# Patient Record
Sex: Male | Born: 1940 | Race: White | Hispanic: No | Marital: Married | Smoking: Former smoker
Health system: Southern US, Community
[De-identification: ages and names within clinical notes are randomized; demographics above are authoritative.]

## PROBLEM LIST (undated history)

## (undated) DIAGNOSIS — Z860100 Personal history of colon polyps, unspecified: Secondary | ICD-10-CM

## (undated) DIAGNOSIS — I4819 Other persistent atrial fibrillation: Secondary | ICD-10-CM

## (undated) DIAGNOSIS — Z8669 Personal history of other diseases of the nervous system and sense organs: Secondary | ICD-10-CM

## (undated) DIAGNOSIS — F419 Anxiety disorder, unspecified: Secondary | ICD-10-CM

## (undated) DIAGNOSIS — D696 Thrombocytopenia, unspecified: Secondary | ICD-10-CM

## (undated) DIAGNOSIS — I422 Other hypertrophic cardiomyopathy: Secondary | ICD-10-CM

## (undated) DIAGNOSIS — C679 Malignant neoplasm of bladder, unspecified: Secondary | ICD-10-CM

## (undated) DIAGNOSIS — Z9889 Other specified postprocedural states: Secondary | ICD-10-CM

## (undated) DIAGNOSIS — Z973 Presence of spectacles and contact lenses: Secondary | ICD-10-CM

## (undated) DIAGNOSIS — I872 Venous insufficiency (chronic) (peripheral): Secondary | ICD-10-CM

## (undated) DIAGNOSIS — I10 Essential (primary) hypertension: Secondary | ICD-10-CM

## (undated) DIAGNOSIS — R112 Nausea with vomiting, unspecified: Secondary | ICD-10-CM

## (undated) DIAGNOSIS — N529 Male erectile dysfunction, unspecified: Secondary | ICD-10-CM

## (undated) DIAGNOSIS — Z8711 Personal history of peptic ulcer disease: Secondary | ICD-10-CM

## (undated) DIAGNOSIS — Z8719 Personal history of other diseases of the digestive system: Secondary | ICD-10-CM

## (undated) HISTORY — PX: CARDIAC ELECTROPHYSIOLOGY MAPPING AND ABLATION: SHX1292

## (undated) HISTORY — PX: CATARACT EXTRACTION W/ INTRAOCULAR LENS IMPLANT: SHX1309

## (undated) HISTORY — PX: TRANSTHORACIC ECHOCARDIOGRAM: SHX275

## (undated) HISTORY — DX: Venous insufficiency (chronic) (peripheral): I87.2

## (undated) HISTORY — DX: Other hypertrophic cardiomyopathy: I42.2

## (undated) HISTORY — DX: Thrombocytopenia, unspecified: D69.6

## (undated) HISTORY — DX: Other persistent atrial fibrillation: I48.19

## (undated) HISTORY — PX: TONSILLECTOMY AND ADENOIDECTOMY: SUR1326

## (undated) HISTORY — PX: CARDIOVERSION: SHX1299

---

## 1998-11-10 ENCOUNTER — Inpatient Hospital Stay (HOSPITAL_COMMUNITY): Admission: EM | Admit: 1998-11-10 | Discharge: 1998-11-12 | Payer: Self-pay | Admitting: Emergency Medicine

## 1998-11-10 ENCOUNTER — Encounter: Payer: Self-pay | Admitting: Emergency Medicine

## 2000-12-23 ENCOUNTER — Encounter: Payer: Self-pay | Admitting: Cardiology

## 2001-02-01 ENCOUNTER — Ambulatory Visit (HOSPITAL_COMMUNITY): Admission: RE | Admit: 2001-02-01 | Discharge: 2001-02-01 | Payer: Self-pay | Admitting: Cardiology

## 2001-04-19 ENCOUNTER — Emergency Department (HOSPITAL_COMMUNITY): Admission: EM | Admit: 2001-04-19 | Discharge: 2001-04-19 | Payer: Self-pay | Admitting: Emergency Medicine

## 2003-11-01 ENCOUNTER — Encounter (INDEPENDENT_AMBULATORY_CARE_PROVIDER_SITE_OTHER): Payer: Self-pay | Admitting: *Deleted

## 2003-11-01 ENCOUNTER — Ambulatory Visit (HOSPITAL_COMMUNITY): Admission: RE | Admit: 2003-11-01 | Discharge: 2003-11-01 | Payer: Self-pay | Admitting: Gastroenterology

## 2004-01-03 ENCOUNTER — Ambulatory Visit (HOSPITAL_COMMUNITY): Admission: RE | Admit: 2004-01-03 | Discharge: 2004-01-03 | Payer: Self-pay | Admitting: Gastroenterology

## 2004-01-03 ENCOUNTER — Encounter (INDEPENDENT_AMBULATORY_CARE_PROVIDER_SITE_OTHER): Payer: Self-pay | Admitting: Specialist

## 2004-09-05 ENCOUNTER — Ambulatory Visit: Payer: Self-pay | Admitting: Cardiology

## 2004-09-23 ENCOUNTER — Ambulatory Visit: Payer: Self-pay | Admitting: Internal Medicine

## 2004-10-17 ENCOUNTER — Ambulatory Visit: Payer: Self-pay

## 2004-10-31 ENCOUNTER — Ambulatory Visit: Payer: Self-pay | Admitting: Cardiology

## 2004-10-31 ENCOUNTER — Ambulatory Visit: Payer: Self-pay | Admitting: Internal Medicine

## 2004-11-06 ENCOUNTER — Ambulatory Visit: Payer: Self-pay | Admitting: Cardiology

## 2004-11-28 ENCOUNTER — Ambulatory Visit: Payer: Self-pay | Admitting: Cardiology

## 2004-12-02 ENCOUNTER — Ambulatory Visit: Payer: Self-pay | Admitting: Cardiology

## 2004-12-26 ENCOUNTER — Ambulatory Visit: Payer: Self-pay | Admitting: Cardiology

## 2005-02-10 ENCOUNTER — Ambulatory Visit: Payer: Self-pay | Admitting: *Deleted

## 2005-02-21 ENCOUNTER — Ambulatory Visit: Payer: Self-pay | Admitting: Internal Medicine

## 2005-03-07 ENCOUNTER — Ambulatory Visit: Payer: Self-pay | Admitting: Cardiology

## 2005-04-10 ENCOUNTER — Ambulatory Visit: Payer: Self-pay | Admitting: Internal Medicine

## 2005-04-15 ENCOUNTER — Ambulatory Visit: Payer: Self-pay | Admitting: Cardiology

## 2005-05-08 ENCOUNTER — Ambulatory Visit: Payer: Self-pay | Admitting: Cardiology

## 2005-05-29 ENCOUNTER — Ambulatory Visit: Payer: Self-pay | Admitting: Cardiology

## 2005-05-29 ENCOUNTER — Ambulatory Visit: Payer: Self-pay | Admitting: Internal Medicine

## 2005-06-12 ENCOUNTER — Ambulatory Visit: Payer: Self-pay | Admitting: Internal Medicine

## 2005-07-03 ENCOUNTER — Ambulatory Visit: Payer: Self-pay | Admitting: Internal Medicine

## 2005-07-17 ENCOUNTER — Ambulatory Visit: Payer: Self-pay | Admitting: Cardiology

## 2005-07-29 ENCOUNTER — Ambulatory Visit: Payer: Self-pay | Admitting: Internal Medicine

## 2005-07-29 ENCOUNTER — Ambulatory Visit: Payer: Self-pay | Admitting: Cardiology

## 2005-09-02 ENCOUNTER — Ambulatory Visit: Payer: Self-pay | Admitting: Cardiology

## 2005-09-19 ENCOUNTER — Ambulatory Visit: Payer: Self-pay | Admitting: Cardiology

## 2005-10-22 ENCOUNTER — Ambulatory Visit: Payer: Self-pay | Admitting: *Deleted

## 2005-11-20 ENCOUNTER — Ambulatory Visit: Payer: Self-pay | Admitting: Cardiology

## 2005-12-01 ENCOUNTER — Ambulatory Visit: Payer: Self-pay | Admitting: Internal Medicine

## 2005-12-11 ENCOUNTER — Ambulatory Visit: Payer: Self-pay | Admitting: Cardiology

## 2005-12-19 ENCOUNTER — Ambulatory Visit: Payer: Self-pay | Admitting: Cardiology

## 2005-12-19 ENCOUNTER — Ambulatory Visit: Payer: Self-pay | Admitting: Pulmonary Disease

## 2005-12-25 ENCOUNTER — Ambulatory Visit: Payer: Self-pay | Admitting: Cardiology

## 2006-01-08 ENCOUNTER — Ambulatory Visit: Payer: Self-pay | Admitting: Cardiology

## 2006-01-29 ENCOUNTER — Ambulatory Visit: Payer: Self-pay | Admitting: Cardiology

## 2006-02-26 ENCOUNTER — Ambulatory Visit: Payer: Self-pay | Admitting: Cardiology

## 2006-03-26 ENCOUNTER — Ambulatory Visit: Payer: Self-pay | Admitting: Cardiology

## 2006-04-23 ENCOUNTER — Ambulatory Visit: Payer: Self-pay | Admitting: Internal Medicine

## 2006-05-07 ENCOUNTER — Ambulatory Visit: Payer: Self-pay | Admitting: Cardiology

## 2006-05-18 ENCOUNTER — Ambulatory Visit: Payer: Self-pay | Admitting: Cardiology

## 2006-06-04 ENCOUNTER — Ambulatory Visit: Payer: Self-pay | Admitting: Cardiology

## 2006-07-02 ENCOUNTER — Ambulatory Visit: Payer: Self-pay | Admitting: Cardiology

## 2006-07-30 ENCOUNTER — Ambulatory Visit: Payer: Self-pay | Admitting: Cardiology

## 2006-08-13 ENCOUNTER — Ambulatory Visit: Payer: Self-pay | Admitting: Internal Medicine

## 2006-08-27 ENCOUNTER — Ambulatory Visit: Payer: Self-pay | Admitting: *Deleted

## 2006-09-21 ENCOUNTER — Ambulatory Visit: Payer: Self-pay | Admitting: Cardiology

## 2006-10-05 ENCOUNTER — Ambulatory Visit: Payer: Self-pay | Admitting: Cardiology

## 2006-10-22 ENCOUNTER — Ambulatory Visit: Payer: Self-pay | Admitting: Cardiology

## 2006-11-02 ENCOUNTER — Encounter: Payer: Self-pay | Admitting: Cardiology

## 2006-11-02 ENCOUNTER — Ambulatory Visit: Payer: Self-pay

## 2006-11-19 ENCOUNTER — Ambulatory Visit: Payer: Self-pay | Admitting: Cardiology

## 2006-11-19 ENCOUNTER — Ambulatory Visit: Payer: Self-pay | Admitting: Internal Medicine

## 2006-11-19 LAB — CONVERTED CEMR LAB
Free T4: 1 ng/dL (ref 0.6–1.6)
T3, Free: 3 pg/mL (ref 2.3–4.2)
TSH: 5.13 microintl units/mL (ref 0.35–5.50)

## 2006-12-24 ENCOUNTER — Ambulatory Visit: Payer: Self-pay | Admitting: *Deleted

## 2007-01-21 ENCOUNTER — Ambulatory Visit: Payer: Self-pay | Admitting: Cardiology

## 2007-02-18 ENCOUNTER — Ambulatory Visit: Payer: Self-pay | Admitting: Cardiology

## 2007-03-18 ENCOUNTER — Ambulatory Visit: Payer: Self-pay | Admitting: Cardiology

## 2007-04-22 ENCOUNTER — Ambulatory Visit: Payer: Self-pay | Admitting: Cardiology

## 2007-05-20 ENCOUNTER — Ambulatory Visit: Payer: Self-pay | Admitting: Cardiology

## 2007-06-10 ENCOUNTER — Ambulatory Visit: Payer: Self-pay | Admitting: Cardiovascular Disease

## 2007-07-08 ENCOUNTER — Ambulatory Visit: Payer: Self-pay | Admitting: Cardiology

## 2007-08-05 ENCOUNTER — Ambulatory Visit: Payer: Self-pay | Admitting: Cardiology

## 2007-08-17 ENCOUNTER — Ambulatory Visit: Payer: Self-pay | Admitting: Cardiovascular Disease

## 2007-08-17 ENCOUNTER — Inpatient Hospital Stay (HOSPITAL_COMMUNITY): Admission: AD | Admit: 2007-08-17 | Discharge: 2007-08-18 | Payer: Self-pay | Admitting: Cardiovascular Disease

## 2007-08-31 ENCOUNTER — Ambulatory Visit: Payer: Self-pay | Admitting: Cardiology

## 2007-09-03 ENCOUNTER — Ambulatory Visit: Payer: Self-pay | Admitting: Cardiology

## 2007-09-21 ENCOUNTER — Ambulatory Visit: Payer: Self-pay | Admitting: Internal Medicine

## 2007-09-21 ENCOUNTER — Ambulatory Visit: Payer: Self-pay | Admitting: Cardiology

## 2007-09-21 LAB — CONVERTED CEMR LAB: Total CK: 76 units/L (ref 7–195)

## 2007-10-02 ENCOUNTER — Ambulatory Visit: Payer: Self-pay | Admitting: Cardiology

## 2007-10-19 ENCOUNTER — Ambulatory Visit: Payer: Self-pay | Admitting: Cardiology

## 2007-11-17 ENCOUNTER — Ambulatory Visit: Payer: Self-pay | Admitting: Cardiovascular Disease

## 2007-11-22 ENCOUNTER — Ambulatory Visit: Payer: Self-pay | Admitting: Internal Medicine

## 2007-11-23 ENCOUNTER — Ambulatory Visit: Payer: Self-pay | Admitting: Cardiovascular Disease

## 2007-11-23 ENCOUNTER — Inpatient Hospital Stay (HOSPITAL_COMMUNITY): Admission: AD | Admit: 2007-11-23 | Discharge: 2007-11-27 | Payer: Self-pay | Admitting: Cardiovascular Disease

## 2007-11-26 ENCOUNTER — Encounter: Payer: Self-pay | Admitting: Cardiovascular Disease

## 2007-11-27 HISTORY — PX: TEE WITH CARDIOVERSION: SHX5442

## 2007-12-03 ENCOUNTER — Ambulatory Visit: Payer: Self-pay

## 2007-12-03 LAB — CONVERTED CEMR LAB
BUN: 24 mg/dL — ABNORMAL HIGH (ref 6–23)
CO2: 31 meq/L (ref 19–32)
Calcium: 8.7 mg/dL (ref 8.4–10.5)
GFR calc Af Amer: 78 mL/min
GFR calc non Af Amer: 64 mL/min
Potassium: 3.8 meq/L (ref 3.5–5.1)

## 2007-12-15 ENCOUNTER — Ambulatory Visit: Payer: Self-pay | Admitting: Internal Medicine

## 2008-01-03 ENCOUNTER — Ambulatory Visit: Payer: Self-pay | Admitting: Cardiology

## 2008-01-13 ENCOUNTER — Ambulatory Visit: Payer: Self-pay | Admitting: Cardiology

## 2008-01-29 ENCOUNTER — Ambulatory Visit: Payer: Self-pay | Admitting: Cardiology

## 2008-01-29 ENCOUNTER — Inpatient Hospital Stay (HOSPITAL_COMMUNITY): Admission: EM | Admit: 2008-01-29 | Discharge: 2008-01-31 | Payer: Self-pay | Admitting: Emergency Medicine

## 2008-01-31 ENCOUNTER — Encounter: Payer: Self-pay | Admitting: Internal Medicine

## 2008-02-03 ENCOUNTER — Ambulatory Visit: Payer: Self-pay | Admitting: Internal Medicine

## 2008-02-23 ENCOUNTER — Ambulatory Visit: Payer: Self-pay | Admitting: Cardiology

## 2008-03-06 ENCOUNTER — Ambulatory Visit: Payer: Self-pay | Admitting: Cardiology

## 2008-04-03 ENCOUNTER — Ambulatory Visit: Payer: Self-pay | Admitting: Cardiology

## 2008-04-03 LAB — CONVERTED CEMR LAB
Alkaline Phosphatase: 55 units/L (ref 39–117)
Bilirubin, Direct: 0.2 mg/dL (ref 0.0–0.3)
Total Bilirubin: 0.8 mg/dL (ref 0.3–1.2)
Total Protein: 6 g/dL (ref 6.0–8.3)

## 2008-05-01 ENCOUNTER — Ambulatory Visit: Payer: Self-pay | Admitting: Internal Medicine

## 2008-05-29 ENCOUNTER — Ambulatory Visit: Payer: Self-pay | Admitting: Cardiology

## 2008-06-28 ENCOUNTER — Encounter: Payer: Self-pay | Admitting: Cardiology

## 2008-06-28 ENCOUNTER — Ambulatory Visit: Payer: Self-pay | Admitting: Internal Medicine

## 2008-07-01 ENCOUNTER — Emergency Department (HOSPITAL_COMMUNITY): Admission: EM | Admit: 2008-07-01 | Discharge: 2008-07-01 | Payer: Self-pay | Admitting: Emergency Medicine

## 2008-07-05 ENCOUNTER — Ambulatory Visit: Payer: Self-pay | Admitting: Internal Medicine

## 2008-07-05 LAB — CONVERTED CEMR LAB
BUN: 15 mg/dL (ref 6–23)
CO2: 32 meq/L (ref 19–32)
Calcium: 8.8 mg/dL (ref 8.4–10.5)
Eosinophils Relative: 1.7 % (ref 0.0–5.0)
Glucose, Bld: 99 mg/dL (ref 70–99)
HCT: 44 % (ref 39.0–52.0)
Hemoglobin: 15 g/dL (ref 13.0–17.0)
INR: 2.4 — ABNORMAL HIGH (ref 0.8–1.0)
Lymphocytes Relative: 20.3 % (ref 12.0–46.0)
Magnesium: 2.4 mg/dL (ref 1.5–2.5)
Monocytes Absolute: 0.6 10*3/uL (ref 0.1–1.0)
Monocytes Relative: 8.2 % (ref 3.0–12.0)
Neutro Abs: 4.8 10*3/uL (ref 1.4–7.7)
Prothrombin Time: 25.7 s — ABNORMAL HIGH (ref 10.9–13.3)
RBC: 4.71 M/uL (ref 4.22–5.81)
RDW: 14 % (ref 11.5–14.6)
Sodium: 143 meq/L (ref 135–145)
WBC: 6.9 10*3/uL (ref 4.5–10.5)

## 2008-07-07 ENCOUNTER — Ambulatory Visit: Payer: Self-pay | Admitting: Internal Medicine

## 2008-07-07 ENCOUNTER — Ambulatory Visit (HOSPITAL_COMMUNITY): Admission: RE | Admit: 2008-07-07 | Discharge: 2008-07-07 | Payer: Self-pay | Admitting: Internal Medicine

## 2008-08-02 ENCOUNTER — Ambulatory Visit: Payer: Self-pay | Admitting: Internal Medicine

## 2008-08-21 ENCOUNTER — Ambulatory Visit: Payer: Self-pay | Admitting: Cardiology

## 2008-08-31 ENCOUNTER — Ambulatory Visit: Payer: Self-pay | Admitting: Cardiology

## 2008-09-28 ENCOUNTER — Ambulatory Visit: Payer: Self-pay | Admitting: Cardiology

## 2008-10-21 ENCOUNTER — Ambulatory Visit: Payer: Self-pay | Admitting: Internal Medicine

## 2008-10-21 ENCOUNTER — Emergency Department (HOSPITAL_COMMUNITY): Admission: EM | Admit: 2008-10-21 | Discharge: 2008-10-21 | Payer: Self-pay | Admitting: Emergency Medicine

## 2008-10-25 ENCOUNTER — Ambulatory Visit: Payer: Self-pay | Admitting: Internal Medicine

## 2008-11-02 ENCOUNTER — Ambulatory Visit: Payer: Self-pay | Admitting: Internal Medicine

## 2008-11-20 ENCOUNTER — Ambulatory Visit (HOSPITAL_COMMUNITY): Admission: RE | Admit: 2008-11-20 | Discharge: 2008-11-20 | Payer: Self-pay | Admitting: Internal Medicine

## 2008-11-20 ENCOUNTER — Ambulatory Visit: Payer: Self-pay | Admitting: Cardiology

## 2008-11-20 ENCOUNTER — Ambulatory Visit: Payer: Self-pay | Admitting: Internal Medicine

## 2008-11-29 ENCOUNTER — Ambulatory Visit (HOSPITAL_BASED_OUTPATIENT_CLINIC_OR_DEPARTMENT_OTHER): Admission: RE | Admit: 2008-11-29 | Discharge: 2008-11-29 | Payer: Self-pay | Admitting: Internal Medicine

## 2008-11-30 ENCOUNTER — Ambulatory Visit: Payer: Self-pay | Admitting: Internal Medicine

## 2008-11-30 LAB — CONVERTED CEMR LAB
BUN: 21 mg/dL (ref 6–23)
Basophils Relative: 0.4 % (ref 0.0–3.0)
CO2: 33 meq/L — ABNORMAL HIGH (ref 19–32)
Chloride: 105 meq/L (ref 96–112)
Creatinine, Ser: 1.1 mg/dL (ref 0.4–1.5)
Eosinophils Relative: 3.2 % (ref 0.0–5.0)
Hemoglobin: 14.2 g/dL (ref 13.0–17.0)
Lymphocytes Relative: 21.7 % (ref 12.0–46.0)
MCV: 91.9 fL (ref 78.0–100.0)
Neutro Abs: 4.8 10*3/uL (ref 1.4–7.7)
Neutrophils Relative %: 65.9 % (ref 43.0–77.0)
Prothrombin Time: 26.4 s — ABNORMAL HIGH (ref 10.9–13.3)
RBC: 4.48 M/uL (ref 4.22–5.81)
WBC: 7.2 10*3/uL (ref 4.5–10.5)

## 2008-12-06 ENCOUNTER — Ambulatory Visit: Payer: Self-pay | Admitting: Cardiology

## 2008-12-06 ENCOUNTER — Ambulatory Visit (HOSPITAL_COMMUNITY): Admission: RE | Admit: 2008-12-06 | Discharge: 2008-12-06 | Payer: Self-pay | Admitting: Cardiology

## 2008-12-06 ENCOUNTER — Encounter: Payer: Self-pay | Admitting: Cardiology

## 2008-12-07 ENCOUNTER — Inpatient Hospital Stay (HOSPITAL_COMMUNITY): Admission: AD | Admit: 2008-12-07 | Discharge: 2008-12-08 | Payer: Self-pay | Admitting: Internal Medicine

## 2008-12-07 ENCOUNTER — Ambulatory Visit: Payer: Self-pay | Admitting: Internal Medicine

## 2008-12-13 ENCOUNTER — Ambulatory Visit: Payer: Self-pay

## 2008-12-13 ENCOUNTER — Ambulatory Visit: Payer: Self-pay | Admitting: Internal Medicine

## 2008-12-15 ENCOUNTER — Ambulatory Visit (HOSPITAL_COMMUNITY): Admission: RE | Admit: 2008-12-15 | Discharge: 2008-12-15 | Payer: Self-pay | Admitting: Internal Medicine

## 2008-12-15 ENCOUNTER — Ambulatory Visit: Payer: Self-pay | Admitting: Internal Medicine

## 2008-12-19 ENCOUNTER — Ambulatory Visit: Payer: Self-pay | Admitting: Cardiology

## 2008-12-19 ENCOUNTER — Ambulatory Visit: Payer: Self-pay | Admitting: Cardiovascular Disease

## 2008-12-23 ENCOUNTER — Ambulatory Visit: Payer: Self-pay | Admitting: Internal Medicine

## 2008-12-28 ENCOUNTER — Ambulatory Visit: Payer: Self-pay | Admitting: Cardiology

## 2008-12-28 ENCOUNTER — Ambulatory Visit: Payer: Self-pay | Admitting: Cardiovascular Disease

## 2008-12-29 ENCOUNTER — Ambulatory Visit: Payer: Self-pay | Admitting: Internal Medicine

## 2009-01-04 ENCOUNTER — Ambulatory Visit: Payer: Self-pay | Admitting: Cardiovascular Disease

## 2009-01-18 ENCOUNTER — Ambulatory Visit: Payer: Self-pay | Admitting: Cardiology

## 2009-01-23 DIAGNOSIS — I10 Essential (primary) hypertension: Secondary | ICD-10-CM | POA: Insufficient documentation

## 2009-01-23 DIAGNOSIS — I4891 Unspecified atrial fibrillation: Secondary | ICD-10-CM | POA: Insufficient documentation

## 2009-01-23 DIAGNOSIS — I421 Obstructive hypertrophic cardiomyopathy: Secondary | ICD-10-CM | POA: Insufficient documentation

## 2009-01-23 DIAGNOSIS — I4892 Unspecified atrial flutter: Secondary | ICD-10-CM | POA: Insufficient documentation

## 2009-01-25 ENCOUNTER — Ambulatory Visit: Payer: Self-pay | Admitting: Internal Medicine

## 2009-01-25 ENCOUNTER — Encounter: Payer: Self-pay | Admitting: Internal Medicine

## 2009-01-25 ENCOUNTER — Ambulatory Visit: Payer: Self-pay | Admitting: Cardiovascular Disease

## 2009-02-01 ENCOUNTER — Ambulatory Visit: Payer: Self-pay | Admitting: Internal Medicine

## 2009-02-08 ENCOUNTER — Ambulatory Visit: Payer: Self-pay | Admitting: Internal Medicine

## 2009-02-13 ENCOUNTER — Ambulatory Visit: Payer: Self-pay | Admitting: Internal Medicine

## 2009-02-16 ENCOUNTER — Ambulatory Visit (HOSPITAL_COMMUNITY): Admission: RE | Admit: 2009-02-16 | Discharge: 2009-02-16 | Payer: Self-pay | Admitting: Cardiology

## 2009-02-16 ENCOUNTER — Ambulatory Visit: Payer: Self-pay | Admitting: Cardiology

## 2009-02-16 LAB — CONVERTED CEMR LAB
Basophils Absolute: 0 10*3/uL (ref 0.0–0.1)
CO2: 31 meq/L (ref 19–32)
Chloride: 109 meq/L (ref 96–112)
Glucose, Bld: 93 mg/dL (ref 70–99)
Hemoglobin: 14.2 g/dL (ref 13.0–17.0)
INR: 2.8 — ABNORMAL HIGH (ref 0.8–1.0)
Lymphocytes Relative: 23.9 % (ref 12.0–46.0)
Monocytes Relative: 9.2 % (ref 3.0–12.0)
Neutro Abs: 4.1 10*3/uL (ref 1.4–7.7)
Potassium: 3.8 meq/L (ref 3.5–5.1)
RBC: 4.39 M/uL (ref 4.22–5.81)
RDW: 14.5 % (ref 11.5–14.6)
Sodium: 144 meq/L (ref 135–145)
WBC: 6.6 10*3/uL (ref 4.5–10.5)
aPTT: 32.1 s — ABNORMAL HIGH (ref 21.7–28.8)

## 2009-02-19 ENCOUNTER — Telehealth: Payer: Self-pay | Admitting: Internal Medicine

## 2009-02-26 ENCOUNTER — Ambulatory Visit: Payer: Self-pay | Admitting: Cardiology

## 2009-03-05 ENCOUNTER — Ambulatory Visit: Payer: Self-pay | Admitting: Internal Medicine

## 2009-03-12 ENCOUNTER — Ambulatory Visit: Payer: Self-pay | Admitting: Cardiology

## 2009-03-13 ENCOUNTER — Telehealth: Payer: Self-pay | Admitting: Cardiology

## 2009-03-22 ENCOUNTER — Telehealth: Payer: Self-pay | Admitting: Cardiology

## 2009-03-27 ENCOUNTER — Encounter: Payer: Self-pay | Admitting: *Deleted

## 2009-03-28 ENCOUNTER — Encounter: Payer: Self-pay | Admitting: Cardiology

## 2009-04-04 ENCOUNTER — Ambulatory Visit: Payer: Self-pay | Admitting: Cardiology

## 2009-04-04 ENCOUNTER — Telehealth: Payer: Self-pay | Admitting: Cardiology

## 2009-04-05 ENCOUNTER — Ambulatory Visit: Payer: Self-pay

## 2009-04-05 ENCOUNTER — Encounter: Payer: Self-pay | Admitting: Cardiology

## 2009-04-11 ENCOUNTER — Telehealth: Payer: Self-pay | Admitting: Cardiology

## 2009-04-16 ENCOUNTER — Ambulatory Visit: Payer: Self-pay | Admitting: Cardiology

## 2009-04-16 ENCOUNTER — Ambulatory Visit: Payer: Self-pay | Admitting: Internal Medicine

## 2009-04-19 ENCOUNTER — Ambulatory Visit: Payer: Self-pay | Admitting: Internal Medicine

## 2009-04-19 LAB — CONVERTED CEMR LAB
POC INR: 3.5
Prothrombin Time: 22.7 s

## 2009-04-26 ENCOUNTER — Ambulatory Visit: Payer: Self-pay | Admitting: Cardiology

## 2009-05-02 ENCOUNTER — Encounter: Payer: Self-pay | Admitting: *Deleted

## 2009-05-03 ENCOUNTER — Ambulatory Visit: Payer: Self-pay | Admitting: Internal Medicine

## 2009-05-03 LAB — CONVERTED CEMR LAB: Prothrombin Time: 19.9 s

## 2009-05-04 ENCOUNTER — Telehealth: Payer: Self-pay | Admitting: Internal Medicine

## 2009-05-07 ENCOUNTER — Ambulatory Visit (HOSPITAL_COMMUNITY): Admission: RE | Admit: 2009-05-07 | Discharge: 2009-05-07 | Payer: Self-pay | Admitting: Cardiovascular Disease

## 2009-05-07 ENCOUNTER — Ambulatory Visit: Payer: Self-pay | Admitting: Cardiovascular Disease

## 2009-05-08 ENCOUNTER — Observation Stay (HOSPITAL_COMMUNITY): Admission: RE | Admit: 2009-05-08 | Discharge: 2009-05-09 | Payer: Self-pay | Admitting: Internal Medicine

## 2009-05-08 ENCOUNTER — Ambulatory Visit: Payer: Self-pay | Admitting: Internal Medicine

## 2009-05-16 ENCOUNTER — Encounter: Payer: Self-pay | Admitting: Internal Medicine

## 2009-05-16 ENCOUNTER — Ambulatory Visit: Payer: Self-pay | Admitting: Cardiology

## 2009-05-16 LAB — CONVERTED CEMR LAB: POC INR: 2.8

## 2009-05-23 ENCOUNTER — Ambulatory Visit: Payer: Self-pay | Admitting: Cardiology

## 2009-05-23 ENCOUNTER — Telehealth: Payer: Self-pay | Admitting: Internal Medicine

## 2009-05-31 ENCOUNTER — Ambulatory Visit: Payer: Self-pay | Admitting: Cardiology

## 2009-06-15 ENCOUNTER — Ambulatory Visit: Payer: Self-pay | Admitting: Cardiology

## 2009-06-15 ENCOUNTER — Telehealth (INDEPENDENT_AMBULATORY_CARE_PROVIDER_SITE_OTHER): Payer: Self-pay | Admitting: *Deleted

## 2009-06-27 ENCOUNTER — Ambulatory Visit: Payer: Self-pay | Admitting: Internal Medicine

## 2009-06-27 LAB — CONVERTED CEMR LAB: POC INR: 3.1

## 2009-07-12 ENCOUNTER — Ambulatory Visit: Payer: Self-pay | Admitting: Cardiology

## 2009-07-12 LAB — CONVERTED CEMR LAB: POC INR: 2.6

## 2009-07-13 ENCOUNTER — Ambulatory Visit: Payer: Self-pay | Admitting: Internal Medicine

## 2009-07-13 ENCOUNTER — Encounter (INDEPENDENT_AMBULATORY_CARE_PROVIDER_SITE_OTHER): Payer: Self-pay | Admitting: *Deleted

## 2009-07-16 ENCOUNTER — Ambulatory Visit: Payer: Self-pay | Admitting: Cardiology

## 2009-07-16 ENCOUNTER — Telehealth: Payer: Self-pay | Admitting: Internal Medicine

## 2009-07-16 LAB — CONVERTED CEMR LAB: POC INR: 2.4

## 2009-07-17 ENCOUNTER — Ambulatory Visit (HOSPITAL_COMMUNITY): Admission: RE | Admit: 2009-07-17 | Discharge: 2009-07-17 | Payer: Self-pay | Admitting: Internal Medicine

## 2009-07-17 ENCOUNTER — Ambulatory Visit: Payer: Self-pay | Admitting: Internal Medicine

## 2009-07-18 ENCOUNTER — Telehealth: Payer: Self-pay | Admitting: Internal Medicine

## 2009-07-30 ENCOUNTER — Ambulatory Visit: Payer: Self-pay | Admitting: Internal Medicine

## 2009-07-30 LAB — CONVERTED CEMR LAB: POC INR: 2.5

## 2009-08-03 ENCOUNTER — Telehealth: Payer: Self-pay | Admitting: Cardiology

## 2009-08-08 ENCOUNTER — Ambulatory Visit: Payer: Self-pay | Admitting: Internal Medicine

## 2009-08-08 ENCOUNTER — Telehealth: Payer: Self-pay | Admitting: Cardiology

## 2009-08-09 LAB — CONVERTED CEMR LAB
Calcium: 8.7 mg/dL (ref 8.4–10.5)
Chloride: 104 meq/L (ref 96–112)
Creatinine, Ser: 1.2 mg/dL (ref 0.4–1.5)
GFR calc non Af Amer: 63.86 mL/min (ref >60)

## 2009-09-03 ENCOUNTER — Ambulatory Visit: Payer: Self-pay | Admitting: Internal Medicine

## 2009-09-03 LAB — CONVERTED CEMR LAB: POC INR: 2.3

## 2009-09-10 ENCOUNTER — Encounter (INDEPENDENT_AMBULATORY_CARE_PROVIDER_SITE_OTHER): Payer: Self-pay | Admitting: *Deleted

## 2009-09-18 ENCOUNTER — Ambulatory Visit: Payer: Self-pay | Admitting: Cardiology

## 2009-10-01 ENCOUNTER — Ambulatory Visit: Payer: Self-pay | Admitting: Cardiology

## 2009-10-01 LAB — CONVERTED CEMR LAB: POC INR: 2.6

## 2009-10-27 HISTORY — PX: OTHER SURGICAL HISTORY: SHX169

## 2009-10-29 ENCOUNTER — Ambulatory Visit: Payer: Self-pay | Admitting: Cardiology

## 2009-10-29 LAB — CONVERTED CEMR LAB: POC INR: 2.2

## 2009-10-30 LAB — CONVERTED CEMR LAB
Calcium: 8.9 mg/dL (ref 8.4–10.5)
Creatinine, Ser: 1.3 mg/dL (ref 0.4–1.5)
GFR calc non Af Amer: 58.19 mL/min (ref >60)
Sodium: 141 meq/L (ref 135–145)

## 2009-11-22 ENCOUNTER — Ambulatory Visit: Payer: Self-pay | Admitting: Cardiology

## 2009-11-26 ENCOUNTER — Ambulatory Visit: Payer: Self-pay | Admitting: Cardiology

## 2009-11-26 LAB — CONVERTED CEMR LAB: POC INR: 2.2

## 2009-12-12 ENCOUNTER — Ambulatory Visit: Payer: Self-pay | Admitting: Internal Medicine

## 2009-12-24 ENCOUNTER — Ambulatory Visit: Payer: Self-pay | Admitting: Cardiology

## 2009-12-24 LAB — CONVERTED CEMR LAB: POC INR: 2.1

## 2010-01-03 ENCOUNTER — Ambulatory Visit: Payer: Self-pay | Admitting: Cardiology

## 2010-01-09 LAB — CONVERTED CEMR LAB
Calcium: 8.8 mg/dL (ref 8.4–10.5)
GFR calc non Af Amer: 53.39 mL/min (ref >60)
Glucose, Bld: 82 mg/dL (ref 70–99)
Potassium: 4.3 meq/L (ref 3.5–5.1)
Sodium: 142 meq/L (ref 135–145)

## 2010-01-17 ENCOUNTER — Ambulatory Visit: Payer: Self-pay | Admitting: Cardiology

## 2010-01-17 DIAGNOSIS — R609 Edema, unspecified: Secondary | ICD-10-CM

## 2010-01-25 ENCOUNTER — Telehealth: Payer: Self-pay | Admitting: Cardiology

## 2010-01-28 ENCOUNTER — Emergency Department (HOSPITAL_COMMUNITY): Admission: EM | Admit: 2010-01-28 | Discharge: 2010-01-28 | Payer: Self-pay | Admitting: Emergency Medicine

## 2010-01-28 ENCOUNTER — Telehealth: Payer: Self-pay | Admitting: Cardiology

## 2010-01-31 ENCOUNTER — Ambulatory Visit: Payer: Self-pay | Admitting: Cardiovascular Disease

## 2010-01-31 ENCOUNTER — Ambulatory Visit: Payer: Self-pay | Admitting: Cardiology

## 2010-01-31 LAB — CONVERTED CEMR LAB: POC INR: 2.5

## 2010-02-05 LAB — CONVERTED CEMR LAB
CO2: 34 meq/L — ABNORMAL HIGH (ref 19–32)
Calcium: 8.6 mg/dL (ref 8.4–10.5)
Creatinine, Ser: 1.4 mg/dL (ref 0.4–1.5)
GFR calc non Af Amer: 53.38 mL/min (ref >60)
Glucose, Bld: 87 mg/dL (ref 70–99)
Sodium: 143 meq/L (ref 135–145)

## 2010-02-27 ENCOUNTER — Ambulatory Visit: Payer: Self-pay | Admitting: Vascular Surgery

## 2010-02-27 ENCOUNTER — Ambulatory Visit: Payer: Self-pay | Admitting: Cardiology

## 2010-03-21 ENCOUNTER — Ambulatory Visit: Payer: Self-pay | Admitting: Cardiology

## 2010-03-22 ENCOUNTER — Telehealth: Payer: Self-pay | Admitting: Cardiology

## 2010-04-18 ENCOUNTER — Ambulatory Visit: Payer: Self-pay | Admitting: Internal Medicine

## 2010-04-18 LAB — CONVERTED CEMR LAB: POC INR: 2.5

## 2010-05-16 ENCOUNTER — Ambulatory Visit: Payer: Self-pay | Admitting: Cardiology

## 2010-05-16 LAB — CONVERTED CEMR LAB: POC INR: 2.7

## 2010-05-31 ENCOUNTER — Ambulatory Visit: Payer: Self-pay | Admitting: Cardiology

## 2010-05-31 DIAGNOSIS — R259 Unspecified abnormal involuntary movements: Secondary | ICD-10-CM | POA: Insufficient documentation

## 2010-06-05 ENCOUNTER — Ambulatory Visit: Payer: Self-pay | Admitting: Cardiology

## 2010-06-05 DIAGNOSIS — E7800 Pure hypercholesterolemia, unspecified: Secondary | ICD-10-CM

## 2010-06-12 LAB — CONVERTED CEMR LAB
BUN: 18 mg/dL (ref 6–23)
Bilirubin, Direct: 0.1 mg/dL (ref 0.0–0.3)
CO2: 33 meq/L — ABNORMAL HIGH (ref 19–32)
Chloride: 105 meq/L (ref 96–112)
Glucose, Bld: 83 mg/dL (ref 70–99)
HDL: 33.1 mg/dL — ABNORMAL LOW (ref >39.00)
Potassium: 4.2 meq/L (ref 3.5–5.1)
Sodium: 142 meq/L (ref 135–145)
Total Bilirubin: 0.5 mg/dL (ref 0.3–1.2)
Total CHOL/HDL Ratio: 4
VLDL: 18.2 mg/dL (ref 0.0–40.0)

## 2010-06-14 ENCOUNTER — Telehealth: Payer: Self-pay | Admitting: Cardiology

## 2010-06-14 ENCOUNTER — Ambulatory Visit: Payer: Self-pay | Admitting: Cardiology

## 2010-06-18 ENCOUNTER — Ambulatory Visit: Payer: Self-pay | Admitting: Vascular Surgery

## 2010-06-18 ENCOUNTER — Encounter: Payer: Self-pay | Admitting: Cardiology

## 2010-06-18 LAB — CONVERTED CEMR LAB
Bilirubin Urine: NEGATIVE
Ketones, ur: NEGATIVE mg/dL
Leukocytes, UA: NEGATIVE
Specific Gravity, Urine: 1.015 (ref 1.000–1.030)
Total Protein, Urine: NEGATIVE mg/dL
Urine Glucose: NEGATIVE mg/dL
pH: 5.5 (ref 5.0–8.0)

## 2010-07-11 ENCOUNTER — Telehealth: Payer: Self-pay | Admitting: Cardiology

## 2010-07-18 ENCOUNTER — Ambulatory Visit: Payer: Self-pay | Admitting: Cardiology

## 2010-08-05 ENCOUNTER — Telehealth: Payer: Self-pay | Admitting: Cardiology

## 2010-08-07 ENCOUNTER — Telehealth: Payer: Self-pay | Admitting: Cardiology

## 2010-08-07 ENCOUNTER — Ambulatory Visit: Payer: Self-pay | Admitting: Vascular Surgery

## 2010-08-07 ENCOUNTER — Encounter: Payer: Self-pay | Admitting: Cardiology

## 2010-08-12 ENCOUNTER — Telehealth: Payer: Self-pay | Admitting: Cardiology

## 2010-08-13 ENCOUNTER — Ambulatory Visit: Payer: Self-pay | Admitting: Vascular Surgery

## 2010-08-15 ENCOUNTER — Ambulatory Visit: Payer: Self-pay | Admitting: Cardiovascular Disease

## 2010-08-15 LAB — CONVERTED CEMR LAB: POC INR: 1.6

## 2010-08-26 ENCOUNTER — Ambulatory Visit: Payer: Self-pay | Admitting: Cardiology

## 2010-08-26 LAB — CONVERTED CEMR LAB: POC INR: 2.4

## 2010-09-04 ENCOUNTER — Encounter: Payer: Self-pay | Admitting: Cardiology

## 2010-09-04 ENCOUNTER — Ambulatory Visit: Payer: Self-pay | Admitting: Vascular Surgery

## 2010-09-23 ENCOUNTER — Ambulatory Visit: Payer: Self-pay | Admitting: Cardiology

## 2010-09-23 LAB — CONVERTED CEMR LAB
INR: 2
POC INR: 2

## 2010-10-22 ENCOUNTER — Ambulatory Visit: Payer: Self-pay | Admitting: Internal Medicine

## 2010-10-22 LAB — CONVERTED CEMR LAB: POC INR: 2.1

## 2010-11-21 ENCOUNTER — Ambulatory Visit: Admission: RE | Admit: 2010-11-21 | Discharge: 2010-11-21 | Payer: Self-pay | Source: Home / Self Care

## 2010-11-21 LAB — CONVERTED CEMR LAB: POC INR: 2

## 2010-11-24 LAB — CONVERTED CEMR LAB
BUN: 25 mg/dL — ABNORMAL HIGH (ref 6–23)
Basophils Absolute: 0 10*3/uL (ref 0.0–0.1)
Basophils Relative: 0.2 % (ref 0.0–3.0)
CO2: 30 meq/L (ref 19–32)
CO2: 33 meq/L — ABNORMAL HIGH (ref 19–32)
Calcium: 8.9 mg/dL (ref 8.4–10.5)
Chloride: 101 meq/L (ref 96–112)
Chloride: 105 meq/L (ref 96–112)
Chloride: 107 meq/L (ref 96–112)
Creatinine, Ser: 1.3 mg/dL (ref 0.4–1.5)
Creatinine, Ser: 1.3 mg/dL (ref 0.4–1.5)
Eosinophils Absolute: 0.2 10*3/uL (ref 0.0–0.7)
Eosinophils Relative: 4 % (ref 0.0–5.0)
GFR calc non Af Amer: 58.17 mL/min (ref >60)
GFR calc non Af Amer: 58.2 mL/min (ref >60)
HCT: 42.7 % (ref 39.0–52.0)
INR: 2.5 — ABNORMAL HIGH (ref 0.8–1.0)
MCHC: 34 g/dL (ref 30.0–36.0)
MCV: 93.4 fL (ref 78.0–100.0)
MCV: 94.2 fL (ref 78.0–100.0)
Magnesium: 2.3 mg/dL (ref 1.5–2.5)
Monocytes Absolute: 0.4 10*3/uL (ref 0.1–1.0)
Monocytes Absolute: 0.6 10*3/uL (ref 0.1–1.0)
Monocytes Relative: 10.1 % (ref 3.0–12.0)
Neutrophils Relative %: 62.7 % (ref 43.0–77.0)
Neutrophils Relative %: 68.2 % (ref 43.0–77.0)
Platelets: 146 10*3/uL — ABNORMAL LOW (ref 150.0–400.0)
Potassium: 3.6 meq/L (ref 3.5–5.1)
Potassium: 3.9 meq/L (ref 3.5–5.1)
RBC: 4.53 M/uL (ref 4.22–5.81)
RDW: 13.9 % (ref 11.5–14.6)
Sodium: 141 meq/L (ref 135–145)
Sodium: 142 meq/L (ref 135–145)
WBC: 5.9 10*3/uL (ref 4.5–10.5)
WBC: 6.6 10*3/uL (ref 4.5–10.5)
aPTT: 37 s — ABNORMAL HIGH (ref 21.7–28.8)

## 2010-11-28 NOTE — Assessment & Plan Note (Signed)
Summary: f89m   Visit Type:  2 months follow up Referring Provider:  Bonnee Quin, MD  CC:  irregular heart beat.  History of Present Illness: Patient overall doing pretty well.  He gets upset sometimes, and he thinks he does get emotional.   Sometimes the HR will get high.  He has seen Dr. Johney Frame back in follow up.  No chest pain, and no unusual shortness of breath.    Current Medications (verified): 1)  Warfarin Sodium 2 Mg Tabs (Warfarin Sodium) .... Use As Directed By Anticoagualtion Clinic 2)  Acetaminophen 500 Mg Tabs (Acetaminophen) .[name].. 1-2 By Mouth As Needed 3)  Diltiazem Hcl Er Beads 300 Mg Xr24h-Cap (Diltiazem Hcl Er Beads) .... Take One Capsule By Mouth Daily 4)  Furosemide 20 Mg Tabs (Furosemide) .... Take One Tablet By Mouth Daily. 5)  Tylenol 325 Mg Tabs (Acetaminophen) .... As Needed 6)  Potassium Chloride Crys Cr 20 Meq Cr-Tabs (Potassium Chloride Crys Cr) .... Take 0.5 Tablet By Mouth Daily  Allergies (verified): No Known Drug Allergies  Vital Signs:  Patient profile:   70 year old male Height:      72 inches Weight:      229.25 pounds BMI:     31.20 Pulse rate:   99 / minute Pulse rhythm:   irregular Resp:     18 per minute BP sitting:   140 / 72  (left arm) Cuff size:   large  Vitals Entered By: Vikki Ports (November 22, 2009 1:52 PM)  Physical Exam  General:  Well developed, well nourished, in no acute distress. Head:  normocephalic and atraumatic Eyes:  PERRLA/EOM intact; conjunctiva and lids normal. Chest Wall:  no deformities or breast masses noted Lungs:  Clear bilaterally to auscultation and percussion. Heart:  HOCM murmur not as prominent.   Abdomen:  Bowel sounds positive; abdomen soft and non-tender without masses, organomegaly, or hernias noted. No hepatosplenomegaly. Extremities:  No clubbing or cyanosis.  Venous stasis changes. Neurologic:  Intact   EKG  Procedure date:  11/22/2009  Findings:      Coarse atrial flutter.  Controlled  ventricular response.    Impression & Recommendations:  Problem # 1:  ATRIAL FIBRILLATION (ICD-427.31)  Rate slightly elevated.  We discussed dilt 360, a slight rise in dose, for better rate control.  He is agreeable to trying this.    Orders: EKG w/ Interpretation (93000) TLB-BMP (Basic Metabolic Panel-BMET) (80048-METABOL)  Problem # 2:  HYPERTENSION, UNSPECIFIED (ICD-401.9)  Controlled.  Orders: EKG w/ Interpretation (93000) TLB-BMP (Basic Metabolic Panel-BMET) (80048-METABOL)  Problem # 3:  HYPERTROPHIC CARDIOMYOPATHY (ICD-425.1)  Last EF preserved.   His updated medication list for this problem includes:    Warfarin Sodium 2 Mg Tabs (Warfarin sodium) ..... Use as directed by anticoagualtion clinic    Diltiazem Hcl Er Beads 300 Mg Xr24h-cap (Diltiazem hcl er beads) .[name]... Take one capsule by mouth daily    Furosemide 20 Mg Tabs (Furosemide) .[name]... Take one tablet by mouth daily.  Orders: EKG w/ Interpretation (93000) TLB-BMP (Basic Metabolic Panel-BMET) (80048-METABOL)  Patient Instructions: 1)  Your physician recommends that you schedule a follow-up appointment in: 2 MONTHS 2)  Your physician has recommended you make the following change in your medication: INCREASE Diltiazem ER 360mg  once a day 3)  Your physician recommends that you have lab work today: BMP Prescriptions: DILTIAZEM HCL ER BEADS 360 MG XR24H-CAP (DILTIAZEM HCL ER BEADS) Take one capsule by mouth daily  #30 x 11   Entered by:  Julieta Gutting, RN, BSN   Authorized by:   Ronaldo Miyamoto, MD, [facility]   Signed by:   Julieta Gutting, RN, BSN on 11/22/2009   Method used:   [facility]  [address] [id number]       [address]       [place], [place]  [address]       Ph: [phone] or [phone]       Fax: [phone]   RxID:   [id number]   Appended Document: f48m The pt called the office when he got home from his appt and he is already taking Diltiazem  360mg  daily.  I made Dr Riley Kill aware of this information.   Clinical Lists Changes

## 2010-11-28 NOTE — Medication Information (Signed)
Summary: rov/tm  Anticoagulant Therapy  Managed by: Shelby Dubin, PharmD, BCPS, CPP Referring MD: Shawnie Pons MD PCP: No PCP at this time Supervising MD: Antoine Poche MD, Fayrene Fearing Indication 1: Atrial Fibrillation (ICD-427.31) Lab Used: [facility] Site: [facility] INR POC 2.2 INR RANGE 2 - 3  Dietary changes: no    Health status changes: no    Bleeding/hemorrhagic complications: no    Recent/future hospitalizations: no    Any changes in medication regimen? no    Recent/future dental: no  Any missed doses?: no       Is patient compliant with meds? yes      Comments: [phone]--cell phone  Allergies (verified): No Known Drug Allergies  Anticoagulation Management History:      The patient is taking warfarin and comes in today for a routine follow up visit.  Positive risk factors for bleeding include an age of 70 years or older.  The bleeding index is 'intermediate risk'.  Positive CHADS2 values include History of HTN.  Negative CHADS2 values include Age > 70 years old.  The start date was 03/02/1998.  His last INR was 2.5 ratio.  Anticoagulation responsible provider: Antoine Poche MD, Fayrene Fearing.  INR POC: 2.2.  Exp: 01/2011.    Anticoagulation Management Assessment/Plan:      The patient's current anticoagulation dose is Warfarin sodium 2 mg tabs: Use as directed by Anticoagualtion Clinic.  The target INR is 2.0-3.0.  The next INR is due 01/31/2010.  Anticoagulation instructions were given to patient.  Results were reviewed/authorized by Shelby Dubin, PharmD, BCPS, CPP.  He was notified by Shelby Dubin PharmD, BCPS, CPP.         Prior Anticoagulation Instructions: INR 2.1 Continue 1mg s everyday except 0.5mg s on Sundays. Recheck in 4 weeks.   Current Anticoagulation Instructions: INR 2.2  Continue 0.25 tab Sunday and 0.5 tab on all other days.  Recheck in 4 weeks.  Dosing confirmed in phone call to patient on 3/15 at 1040 am.

## 2010-11-28 NOTE — Medication Information (Signed)
Summary: rov/jb  Anticoagulant Therapy  Managed by: Weston Brass, PharmD Referring MD: Shawnie Pons MD PCP: No PCP at this time Supervising MD: Myrtis Ser MD, Tinnie Gens Indication 1: Atrial Fibrillation (ICD-427.31) Lab Used: [facility] Site: [facility] INR POC 2.0 INR RANGE 2 - 3  Dietary changes: no    Health status changes: no    Bleeding/hemorrhagic complications: no    Recent/future hospitalizations: no    Any changes in medication regimen? no    Recent/future dental: no  Any missed doses?: no       Is patient compliant with meds? yes      Comments: Some problems with 1/4 tablets crumbling, gave pt samples of 1 mg tablets   Allergies: No Known Drug Allergies  Anticoagulation Management History:      The patient is taking warfarin and comes in today for a routine follow up visit.  Positive risk factors for bleeding include an age of 70 years or older.  The bleeding index is 'intermediate risk'.  Positive CHADS2 values include History of HTN.  Negative CHADS2 values include Age > 21 years old.  The start date was 03/02/1998.  His last INR was 2.  Anticoagulation responsible provider: Myrtis Ser MD, Tinnie Gens.  INR POC: 2.0.  Cuvette Lot#: [id number].  Exp: 10/2011.    Anticoagulation Management Assessment/Plan:      The patient's current anticoagulation dose is Warfarin sodium 2 mg tabs: Use as directed by Anticoagualtion Clinic.  The target INR is 2.0-3.0.  The next INR is due 12/19/2010.  Anticoagulation instructions were given to patient.  Results were reviewed/authorized by Weston Brass, PharmD.  He was notified by Stephannie Peters, PharmD Candidate .         Prior Anticoagulation Instructions: Cont with same regimen Return to clinic on Jan 26th, at 330 pm  Current Anticoagulation Instructions: INR 2.0  Coumadin 2 mg tablets - Continue 1/2 tablet every day except 1/4 tablet on Sundays (or 1/2 of 1 mg tablet)

## 2010-11-28 NOTE — Progress Notes (Signed)
Summary: Refill  Phone Note Call from Patient   Summary of Call: Pt came into the office for UA and requested a refill on his medication. Initial call taken by: Julieta Gutting, RN, BSN,  June 14, 2010 11:51 AM    Prescriptions: METOPROLOL SUCCINATE 25 MG XR24H-TAB (METOPROLOL SUCCINATE) Take one tablet by mouth daily  #90 x 3   Entered by:   Julieta Gutting, RN, BSN   Authorized by:   Ronaldo Miyamoto, MD, [facility]   Signed by:   Julieta Gutting, RN, BSN on 06/14/2010   Method used:   [facility]  [address] [id number]       [address]       [place], [place]  [address]       Ph: [phone]       Fax: [phone]   RxID:   [id number]

## 2010-11-28 NOTE — Progress Notes (Signed)
Summary: Laser Ablation  Phone Note From Other Clinic   Caller: Sonya Rankin with Dr Arbie Cookey Summary of Call: I received a call from [facility] and Dr Early has scheduled this pt for Laser Ablation of greater [place] on 08/07/10 and 08/28/10.  Dr Arbie Cookey would like clearance from Dr Riley Kill to have the pt hold his coumadin 3 days prior to these two procedure dates. Clearance note and med list will need to be faxed  to [phone] Attn: Sonya Rankin. I will forward this message to Dr Riley Kill for review.  Initial call taken by: Julieta Gutting, RN, BSN,  July 11, 2010 5:30 PM  Follow-up for Phone Call        The pt called and would like to speak with Dr Riley Kill about seeing MD at [facility]. Julieta Gutting, RN, BSN  July 17, 2010 2:52 PM  Additional Follow-up for Phone Call Additional follow up Details #1::        It would be ok for him to hold his Coumadin for those days for his laser treatment.  There is some increase risk holding coumadin always, but his risk is not excessively high for a necessary procedure.  I will call patient about [facility] referral. Additional Follow-up by: Ronaldo Miyamoto, MD, [facility],  July 19, 2010 5:19 AM

## 2010-11-28 NOTE — Progress Notes (Signed)
Summary: medication question  Phone Note Call from Patient Call back at Home Phone [phone]   Caller: Patient Reason for Call: Talk to Nurse, Talk to Doctor Summary of Call: pt needs to know right away if he needs to take his water pills and his calcium since his procedure Initial call taken by: Omer Jack,  August 12, 2010 10:42 AM  Follow-up for Phone Call        The pt called because he had vein procedure on leg and his swelling has resolved.  The pt does still have some "puffiness" in his other leg.  The pt wanted to know if he should continue taking Furosemide and potassium.  I instructed the pt to continue his current dose and when he comes into the coumadin clinic later this week I could look at his legs.  We may be able to cut dose in half. Pt agreed with plan. The pt was started on these medications due to lower extremity edema.  Follow-up by: Julieta Gutting, RN, BSN,  August 12, 2010 11:08 AM

## 2010-11-28 NOTE — Progress Notes (Signed)
Summary: Furosemide Rx  Phone Note Call from Patient Call back at [phone]   Reason for Call: Talk to Nurse Summary of Call: request to speak to nurse about meds Initial call taken by: Migdalia Dk,  January 28, 2010 8:24 AM  Follow-up for Phone Call        The pt needs a furosemide Rx sent to his pharmacy.   Follow-up by: Julieta Gutting, RN, BSN,  January 28, 2010 11:36 AM    Prescriptions: FUROSEMIDE 20 MG TABS (FUROSEMIDE) Take 2 tablets daily  #60 x 6   Entered by:   Julieta Gutting, RN, BSN   Authorized by:   Ronaldo Miyamoto, MD, [facility]   Signed by:   Julieta Gutting, RN, BSN on 01/28/2010   Method used:   [facility]  [address] [id number]       [address]       [place], [place]  [address]       Ph: [phone]       Fax: [phone]   RxID:   [id number]

## 2010-11-28 NOTE — Medication Information (Signed)
Summary: rov/eac  Anticoagulant Therapy  Managed by: Cloyde Reams, RN, BSN Referring MD: Shawnie Pons MD PCP: No PCP at this time Supervising MD: [name] MD, Fayrene Fearing Indication 1: Atrial Fibrillation (ICD-427.31) Lab Used: [facility] Site: [facility] INR POC 2.5 INR RANGE 2 - 3  Dietary changes: no    Health status changes: no    Bleeding/hemorrhagic complications: no    Recent/future hospitalizations: no    Any changes in medication regimen? no    Recent/future dental: no  Any missed doses?: no       Is patient compliant with meds? yes       Allergies: No Known Drug Allergies  Anticoagulation Management History:      The patient is taking warfarin and comes in today for a routine follow up visit.  Positive risk factors for bleeding include an age of 70 years or older.  The bleeding index is 'intermediate risk'.  Positive CHADS2 values include History of HTN.  Negative CHADS2 values include Age > 74 years old.  The start date was 03/02/1998.  His last INR was 2.5 ratio.  Anticoagulation responsible provider: Clio Gerhart MD, Fayrene Fearing.  INR POC: 2.5.  Cuvette Lot#: [id number].  Exp: 06/2011.    Anticoagulation Management Assessment/Plan:      The patient's current anticoagulation dose is Warfarin sodium 2 mg tabs: Use as directed by Anticoagualtion Clinic.  The target INR is 2.0-3.0.  The next INR is due 05/16/2010.  Anticoagulation instructions were given to patient.  Results were reviewed/authorized by Cloyde Reams, RN, BSN.  He was notified by Cloyde Reams RN.         Prior Anticoagulation Instructions: INR 2.4  Continue taking 1/2 tablet everyday, except take 1/4 tablet on Sunday.  Return to clinic in 4 weeks.    Current Anticoagulation Instructions: INR 2.5  Continue on same dosage 1/2 tablet daily except 1/4 tablet on Sundays.  Recheck in 4 weeks.

## 2010-11-28 NOTE — Medication Information (Signed)
Summary: ROV  Anticoagulant Therapy  Managed by: Samantha Crimes, PharmD Referring MD: Shawnie Pons MD PCP: No PCP at this time Supervising MD: Graciela Husbands MD, Viviann Spare Indication 1: Atrial Fibrillation (ICD-427.31) Lab Used: [facility] Site: [facility] INR POC 2.1 INR RANGE 2 - 3  Dietary changes: no    Health status changes: no    Bleeding/hemorrhagic complications: no    Recent/future hospitalizations: no    Any changes in medication regimen? no    Recent/future dental: no  Any missed doses?: no       Is patient compliant with meds? yes       Current Medications (verified): 1)  Warfarin Sodium 2 Mg Tabs (Warfarin Sodium) .... Use As Directed By Anticoagualtion Clinic 2)  Furosemide 20 Mg Tabs (Furosemide) .... Take 2 Tablets Daily 3)  Tylenol 325 Mg Tabs (Acetaminophen) .... As Needed 4)  Potassium Chloride Crys Cr 20 Meq Cr-Tabs (Potassium Chloride Crys Cr) .... Take 1 Tablet By Mouth Once A Day 5)  Metoprolol Succinate 25 Mg Xr24h-Tab (Metoprolol Succinate) .... Take One Tablet By Mouth Daily 6)  Diltiazem Hcl Er Beads 360 Mg Xr24h-Cap (Diltiazem Hcl Er Beads) .... Take One Capsule By Mouth Daily  Allergies (verified): No Known Drug Allergies  Anticoagulation Management History:      Positive risk factors for bleeding include an age of 28 years or older.  The bleeding index is 'intermediate risk'.  Positive CHADS2 values include History of HTN.  Negative CHADS2 values include Age > 24 years old.  The start date was 03/02/1998.  His last INR was 2.  Anticoagulation responsible provider: Graciela Husbands MD, Viviann Spare.  INR POC: 2.1.  Exp: 09/2011.    Anticoagulation Management Assessment/Plan:      The patient's current anticoagulation dose is Warfarin sodium 2 mg tabs: Use as directed by Anticoagualtion Clinic.  The target INR is 2.0-3.0.  The next INR is due 10/22/2010.  Anticoagulation instructions were given to patient.  Results were reviewed/authorized by Samantha Crimes, PharmD.        Prior Anticoagulation Instructions: INR: 2  Your INR is within goal today.  Please continue to take 1 mg daily except for 0.5 mg on Sunday.  Come back in 4 weeks for another INR check.    Current Anticoagulation Instructions: Cont with same regimen Return to clinic on Jan 26th, at 330 pm

## 2010-11-28 NOTE — Assessment & Plan Note (Signed)
Summary: f56m   Visit Type:  Follow-up Referring Provider:  Bonnee Quin, MD Primary Provider:  No PCP at this time  CC:  irregular heart beat- Tiredness.  History of Present Illness: Bradley Winters is a bit fatigued, but overall is doing well, and hr is about as well controlled as it has been overall.  Denies chest pain.  Is being evaluated for his varicosities.    Current Medications (verified): 1)  Warfarin Sodium 2 Mg Tabs (Warfarin Sodium) .... Use As Directed By Anticoagualtion Clinic 2)  Acetaminophen 500 Mg Tabs (Acetaminophen) .[name].. 1-2 By Mouth As Needed 3)  Diltiazem Hcl Er Beads 360 Mg Xr24h-Cap (Diltiazem Hcl Er Beads) .... Take One Capsule By Mouth Daily 4)  Furosemide 20 Mg Tabs (Furosemide) .... Take 2 Tablets Daily 5)  Tylenol 325 Mg Tabs (Acetaminophen) .... As Needed 6)  Potassium Chloride Crys Cr 20 Meq Cr-Tabs (Potassium Chloride Crys Cr) .... Take 1 Tablet By Mouth Once A Day 7)  Metoprolol Succinate 25 Mg Xr24h-Tab (Metoprolol Succinate) .... Take One Tablet By Mouth Daily  Allergies (verified): No Known Drug Allergies  Vital Signs:  Patient profile:   70 year old male Height:      72 inches Weight:      238.50 pounds BMI:     32.46 Pulse rate:   76 / minute Pulse rhythm:   irregular Resp:     18 per minute BP sitting:   120 / 70  (left arm) Cuff size:   large  Vitals Entered By: Bradley Winters (Feb 27, 2010 9:40 AM)  Physical Exam  General:  Well developed, well nourished, in no acute distress. Head:  normocephalic and atraumatic Eyes:  PERRLA/EOM intact; conjunctiva and lids normal. Lungs:  Clear bilaterally to auscultation and percussion. Heart:  PMI slightly displaced.  SEM, 2/6, less prominent from prior exams.   Abdomen:  Bowel sounds positive; abdomen soft and non-tender without masses, organomegaly, or hernias noted. No hepatosplenomegaly. Pulses:  pulses normal in all 4 extremities Extremities:  Bilateral venous varicosities.   Neurologic:   Alert and oriented x 3.   EKG  Procedure date:  02/27/2010  Findings:      Atrial fibrillation.  Left axis deviation.  Low voltage QRS.  Impression & Recommendations:  Problem # 1:  HYPERTROPHIC CARDIOMYOPATHY (ICD-425.1) remains on combination therapy and overall is doing well.  Some fatigue may be medication related.    Problem # 2:  ATRIAL FIBRILLATION (ICD-427.31) Two failed attempts at fib ablation with Dr. Johney Frame.  Chronic use of Amiodarone previously under the recommendation of Dr. Ladona Ridgel.  Developed tremor which is now improved off of therapy.  Rate is currently controlled on combo rate controlling drugs, and HOCM murmur less prominent.  Would favor continued management of same.  Will discuss other options.  Other Orders: EKG w/ Interpretation (93000)  Patient Instructions: 1)  Your physician recommends that you schedule a follow-up appointment in: 3 MONTHS 2)  Your physician recommends that you continue on your current medications as directed. Please refer to the Current Medication list given to you today.

## 2010-11-28 NOTE — Letter (Signed)
Summary: Dr Clarene Critchley [facility] Office Visit Note   Dr Clarene Critchley [facility] Office Visit Note   Imported By: Roderic Ovens 05/14/2010 11:42:59  _____________________________________________________________________  External Attachment:    Type:   Image     Comment:   External Document

## 2010-11-28 NOTE — Letter (Signed)
Summary: [facility] Visit   [facility] Visit   Imported By: Roderic Ovens 09/27/2010 16:12:25  _____________________________________________________________________  External Attachment:    Type:   Image     Comment:   External Document

## 2010-11-28 NOTE — Medication Information (Signed)
Summary: rov/mw  Anticoagulant Therapy  Managed by: Eda Keys, PharmD Referring MD: Shawnie Pons MD PCP: No PCP at this time Supervising MD: Eden Emms MD, Theron Arista Indication 1: Atrial Fibrillation (ICD-427.31) Lab Used: [facility] Site: [facility] INR POC 1.6 INR RANGE 2 - 3  Dietary changes: no    Health status changes: no    Bleeding/hemorrhagic complications: yes       Details: minor bruising from recent vein procedure  Recent/future hospitalizations: no    Any changes in medication regimen? no    Recent/future dental: no  Any missed doses?: no       Is patient compliant with meds? yes      Comments: Back on coumadin, resumed at normal dose on day of procedure about 1 week ago.   Patient will have another vein procedure, but does not know when this will be scheduled, will notify us when it is.  Allergies: No Known Drug Allergies  Anticoagulation Management History:      The patient is taking warfarin and comes in today for a routine follow up visit.  Positive risk factors for bleeding include an age of 70 years or older.  The bleeding index is 'intermediate risk'.  Positive CHADS2 values include History of HTN.  Negative CHADS2 values include Age > 11 years old.  The start date was 03/02/1998.  His last INR was 2.5 ratio.  Anticoagulation responsible provider: Eden Emms MD, Theron Arista.  INR POC: 1.6.  Cuvette Lot#: [id number].  Exp: 09/2011.    Anticoagulation Management Assessment/Plan:      The patient's current anticoagulation dose is Warfarin sodium 2 mg tabs: Use as directed by Anticoagualtion Clinic.  The target INR is 2.0-3.0.  The next INR is due 08/22/2010.  Anticoagulation instructions were given to patient.  Results were reviewed/authorized by Eda Keys, PharmD.  He was notified by Eda Keys.         Prior Anticoagulation Instructions: INR 2.2 Continue taking a 1/2 tablet everyday except on Sunday. On sunday take a 1/4 tablet. We will see you on the 20th  after procedure. We will call you with instructions once you get clearance from Dr. Riley Kill.  Current Anticoagulation Instructions: INR 1.6  Take 1 tablet today and tomorrow.  Then return to normal dosing schedule of 1/4 tablet on Sunday and 1/5 tablet all other days.  Return to clinic in 10 days.

## 2010-11-28 NOTE — Medication Information (Signed)
Summary: rov/sp  Anticoagulant Therapy  Managed by: Geoffry Paradise, PharmD Referring MD: Shawnie Pons MD PCP: No PCP at this time Supervising MD: Riley Kill MD, Maisie Fus Indication 1: Atrial Fibrillation (ICD-427.31) Lab Used: [facility] Site: [facility] INR POC 2 INR RANGE 2 - 3  Dietary changes: no    Health status changes: no    Bleeding/hemorrhagic complications: no    Recent/future hospitalizations: no    Any changes in medication regimen? no    Recent/future dental: no  Any missed doses?: no       Is patient compliant with meds? yes       Allergies: No Known Drug Allergies  Anticoagulation Management History:      Positive risk factors for bleeding include an age of 70 years or older.  The bleeding index is 'intermediate risk'.  Positive CHADS2 values include History of HTN.  Negative CHADS2 values include Age > 70 years old.  The start date was 03/02/1998.  His last INR was 2.5 ratio and today's INR is 2.  Anticoagulation responsible provider: Riley Kill MD, Maisie Fus.  INR POC: 2.  Cuvette Lot#: [id number].  Exp: 09/2011.    Anticoagulation Management Assessment/Plan:      The patient's current anticoagulation dose is Warfarin sodium 2 mg tabs: Use as directed by Anticoagualtion Clinic.  The target INR is 2.0-3.0.  The next INR is due 10/22/2010.  Anticoagulation instructions were given to patient.  Results were reviewed/authorized by Geoffry Paradise, PharmD.         Prior Anticoagulation Instructions: INR 2.4  Continue same dose of 1/2 tablet every day except 1/4 tablet on Sunday.  Recheck INR in 4 weeks.   Current Anticoagulation Instructions: INR: 2  Your INR is within goal today.  Please continue to take 1 mg daily except for 0.5 mg on Sunday.  Come back in 4 weeks for another INR check.

## 2010-11-28 NOTE — Medication Information (Signed)
Summary: rov/eac  Anticoagulant Therapy  Managed by: Weston Brass, PharmD Referring MD: Shawnie Pons MD PCP: No PCP at this time Supervising MD: Shirlee Latch MD, Dalton Indication 1: Atrial Fibrillation (ICD-427.31) Lab Used: [facility] Site: [facility] INR POC 2.4 INR RANGE 2 - 3  Dietary changes: no    Health status changes: no    Bleeding/hemorrhagic complications: no    Recent/future hospitalizations: no    Any changes in medication regimen? no    Recent/future dental: no  Any missed doses?: no       Is patient compliant with meds? yes       Allergies: No Known Drug Allergies  Anticoagulation Management History:      The patient is taking warfarin and comes in today for a routine follow up visit.  Positive risk factors for bleeding include an age of 20 years or older.  The bleeding index is 'intermediate risk'.  Positive CHADS2 values include History of HTN.  Negative CHADS2 values include Age > 26 years old.  The start date was 03/02/1998.  His last INR was 2.5 ratio.  Anticoagulation responsible provider: Shirlee Latch MD, Dalton.  INR POC: 2.4.  Exp: 09/2011.    Anticoagulation Management Assessment/Plan:      The patient's current anticoagulation dose is Warfarin sodium 2 mg tabs: Use as directed by Anticoagualtion Clinic.  The target INR is 2.0-3.0.  The next INR is due 09/23/2010.  Anticoagulation instructions were given to patient.  Results were reviewed/authorized by Weston Brass, PharmD.  He was notified by Weston Brass PharmD.         Prior Anticoagulation Instructions: INR 1.6  Take 1 tablet today and tomorrow.  Then return to normal dosing schedule of 1/4 tablet on Sunday and 1/5 tablet all other days.  Return to clinic in 10 days.    Current Anticoagulation Instructions: INR 2.4  Continue same dose of 1/2 tablet every day except 1/4 tablet on Sunday.  Recheck INR in 4 weeks.

## 2010-11-28 NOTE — Medication Information (Signed)
Summary: rov/td  Anticoagulant Therapy  Managed by: Bethena Midget, RN, BSN Referring MD: Shawnie Pons MD PCP: No PCP at this time Supervising MD: Shirlee Latch MD, Dalton Indication 1: Atrial Fibrillation (ICD-427.31) Lab Used: [facility] Site: [facility] INR POC 2.1 INR RANGE 2 - 3  Dietary changes: no    Health status changes: yes       Details: Cellulitis in Rt. lower leg, very edematous.   Bleeding/hemorrhagic complications: no    Recent/future hospitalizations: no    Any changes in medication regimen? yes       Details: Keflex 750mg s BID for 10 days started on 12/18/09.   Recent/future dental: no  Any missed doses?: no       Is patient compliant with meds? yes      Comments: Per PCP( urgent care)  he is to double up on his lasix and potassium for 10 days. Maceo Pro., Dr. Rosalyn Charters nurse made aware. We will order BMET for next week.   Allergies: No Known Drug Allergies  Anticoagulation Management History:      The patient is taking warfarin and comes in today for a routine follow up visit.  Positive risk factors for bleeding include an age of 70 years or older.  The bleeding index is 'intermediate risk'.  Positive CHADS2 values include History of HTN.  Negative CHADS2 values include Age > 70 years old.  The start date was 03/02/1998.  His last INR was 2.5 ratio.  Anticoagulation responsible provider: Shirlee Latch MD, Dalton.  INR POC: 2.1.  Cuvette Lot#: [id number].  Exp: 01/2011.    Anticoagulation Management Assessment/Plan:      The patient's current anticoagulation dose is Warfarin sodium 2 mg tabs: Use as directed by Anticoagualtion Clinic.  The target INR is 2.0-3.0.  The next INR is due 01/21/2010.  Anticoagulation instructions were given to patient.  Results were reviewed/authorized by Bethena Midget, RN, BSN.  He was notified by Bethena Midget, RN, BSN.         Prior Anticoagulation Instructions: INR=2.2 The patient is to continue with the same dose of coumadin.  This dosage  includes:  1/2 tab - Mon, Tue, Wed, Thur, Fri, Sat 1/4 tab - Sun  Current Anticoagulation Instructions: INR 2.1 Continue 1mg s everyday except 0.5mg s on Sundays. Recheck in 4 weeks.

## 2010-11-28 NOTE — Progress Notes (Signed)
Summary: cellulitis problems   Phone Note Call from Patient Call back at [phone]   Caller: pt Reason for Call: Talk to Nurse Summary of Call: pt having problems with cellulitis needs to talk with nurse Initial call taken by: Faythe Ghee,  January 25, 2010 12:57 PM  Follow-up for Phone Call        I spoke with the pt and he wanted to know if his leg would be like this the rest of his life.  The pt is scheduled to see [facility]  in May and I told him that they may be able to offer him treatment from his venous incompetence.  The pt does have some weeping  from his right leg.  The pt noticed a half dollar size wet spot on his pant leg earlier this week.  Today his right leg is swollen about what it normally is and temperature of leg feels the same as ususal, no weeping today.  The pt's BP 115/84 and pulse today 79. I told the pt to see what [facility] could offer him to improve his right leg. The pt agrees with plan.  Follow-up by: Julieta Gutting, RN, BSN,  January 25, 2010 2:21 PM

## 2010-11-28 NOTE — Assessment & Plan Note (Signed)
Summary: per check out/sf   Visit Type:  rov Referring Provider:  Bonnee Quin, MD Primary Provider:  No PCP at this time  CC:  no cardiac complaints today.  History of Present Illness: The patient presents today for routine electrophysiology followup. He reports doing well since last being seen in our clinic. The patient denies symptoms of chest pain, shortness of breath, orthopnea, PND, dizziness, presyncope, syncope, or neurologic sequela.  His edema is stable.  The patient is tolerating medications without difficulties and is otherwise without complaint today.   Current Medications (verified): 1)  Warfarin Sodium 2 Mg Tabs (Warfarin Sodium) .... Use As Directed By Anticoagualtion Clinic 2)  Acetaminophen 500 Mg Tabs (Acetaminophen) .[name].. 1-2 By Mouth As Needed 3)  Diltiazem Hcl Er Beads 360 Mg Xr24h-Cap (Diltiazem Hcl Er Beads) .... Take One Capsule By Mouth Daily 4)  Furosemide 20 Mg Tabs (Furosemide) .... Take One Tablet By Mouth Daily. 5)  Tylenol 325 Mg Tabs (Acetaminophen) .... As Needed 6)  Potassium Chloride Crys Cr 20 Meq Cr-Tabs (Potassium Chloride Crys Cr) .... Take 0.5 Tablet By Mouth Daily  Allergies (verified): No Known Drug Allergies  Past History:  Past Medical History: Reviewed history from 08/08/2009 and no changes required. 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 S/p catheter ablation for atrial fibrillation 12/07/08, 7/10  Vital Signs:  Patient profile:   70 year old male Height:      72 inches Weight:      235 pounds BMI:     31.99 Pulse rate:   92 / minute Pulse rhythm:   regular BP sitting:   138 / 82  (left arm) Cuff size:   regular  Vitals Entered By: Danielle Rankin, CMA (December 12, 2009 9:19 AM)  Physical Exam  General:  Well  developed, well nourished, in no acute distress. Head:  normocephalic and atraumatic Eyes:  PERRLA/EOM intact; conjunctiva and lids normal. Mouth:  Teeth, gums and palate normal. Oral mucosa normal. Neck:  Neck supple, no JVD. No masses, thyromegaly or abnormal cervical nodes. Lungs:  Clear bilaterally to auscultation and percussion. Heart:  iRRR, no m/r/g Abdomen:  Bowel sounds positive; abdomen soft and non-tender without masses, organomegaly, or hernias noted. No hepatosplenomegaly. Msk:  Back normal, normal gait. Muscle strength and tone normal. Pulses:  pulses normal in all 4 extremities Extremities:  No clubbing or cyanosis.  +Venous stasis changes and edema are stable. Neurologic:  strength/sensation are intact Skin:  Chronic venous stasis changes on both legs (R>L) with 1+ bilateral lower extremity edema Cervical Nodes:  no significant adenopathy Psych:  Normal affect.   Impression & Recommendations:  Problem # 1:  ATRIAL FIBRILLATION (ICD-427.31) stable, rate reasonably controlled.  Pt reluctant to increase diltiazem CD to 480mg  daily.  Will therefore continue current medicine. Coumadin for stroke prevention  Problem # 2:  HYPERTENSION, UNSPECIFIED (ICD-401.9) stable no changes  His updated medication list for this problem includes:    Diltiazem Hcl Er Beads 360 Mg Xr24h-cap (Diltiazem hcl er beads) .[name]... Take one capsule by mouth daily    Furosemide 20 Mg Tabs (Furosemide) .[name]... Take one tablet by mouth daily.  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 Use as directed by  anticoagualtion clinic    Diltiazem Hcl Er Beads 360 Mg Xr24h-cap (Diltiazem hcl er beads) .[name]... Take one capsule by mouth daily    Furosemide 20 Mg Tabs (Furosemide) .[name]... Take one tablet by mouth daily.   Patient  Instructions: 1)  Return in 6 months

## 2010-11-28 NOTE — Medication Information (Signed)
Summary: rov/ewj  Anticoagulant Therapy  Managed by: Bethanne Ginger, PharmD Referring MD: Shawnie Pons MD Supervising MD: Shirlee Latch MD, Shalita Notte Indication 1: Atrial Fibrillation (ICD-427.31) Lab Used: [facility] Site: [facility] INR POC 2.2 INR RANGE 2 - 3  Dietary changes: no    Health status changes: no    Bleeding/hemorrhagic complications: no    Recent/future hospitalizations: no    Any changes in medication regimen? no    Recent/future dental: no  Any missed doses?: no       Is patient compliant with meds? yes       Current Medications (verified): 1)  Warfarin Sodium 2 Mg Tabs (Warfarin Sodium) .... Use As Directed By Anticoagualtion Clinic 2)  Acetaminophen 500 Mg Tabs (Acetaminophen) .[name].. 1-2 By Mouth As Needed 3)  Diltiazem Hcl Er Beads 360 Mg Xr24h-Cap (Diltiazem Hcl Er Beads) .... Take One Capsule By Mouth Daily 4)  Furosemide 20 Mg Tabs (Furosemide) .... Take One Tablet By Mouth Daily. 5)  Tylenol 325 Mg Tabs (Acetaminophen) .... As Needed 6)  Potassium Chloride Crys Cr 20 Meq Cr-Tabs (Potassium Chloride Crys Cr) .... Take 0.5 Tablet By Mouth Daily  Allergies (verified): No Known Drug Allergies  Anticoagulation Management History:      The patient is taking warfarin and comes in today for a routine follow up visit.  Positive risk factors for bleeding include an age of 70 years or older.  The bleeding index is 'intermediate risk'.  Positive CHADS2 values include History of HTN.  Negative CHADS2 values include Age > 59 years old.  The start date was 03/02/1998.  His last INR was 2.5 ratio.  Anticoagulation responsible provider: Shirlee Latch MD, Sharai Overbay.  INR POC: 2.2.  Cuvette Lot#: [id number].  Exp: 01/2011.    Anticoagulation Management Assessment/Plan:      The patient's current anticoagulation dose is Warfarin sodium 2 mg tabs: Use as directed by Anticoagualtion Clinic.  The target INR is 2.0-3.0.  The next INR is due 12/24/2009.  Anticoagulation instructions were  given to patient.  Results were reviewed/authorized by Bethanne Ginger, PharmD.  He was notified by Bethanne Ginger.         Prior Anticoagulation Instructions: INR 2.2  Continue on same dosage 1mg  daily except 0.5mg  on Sundays.  Recheck in 4 weeks.    Current Anticoagulation Instructions: INR=2.2 The patient is to continue with the same dose of coumadin.  This dosage includes:  1/2 tab - Mon, Tue, Wed, Thur, Fri, Sat 1/4 tab - Sun

## 2010-11-28 NOTE — Progress Notes (Signed)
Summary: medication question  Phone Note Call from Patient Call back at Home Phone [phone]   Caller: Patient Reason for Call: Talk to Nurse, Talk to Doctor Summary of Call: pt wants to talk to you about ibuprofen Initial call taken by: Omer Jack,  August 05, 2010 3:02 PM  Follow-up for Phone Call        pt states he is having some surgery on his leg and Ibuprofen has been recommended to help reduce the chances of swelling--pt states he was told if he cannot use Ibuprofen it should be OK but he may have to  use more ice--I told  him we would not recommend using Ibuprofen but I could ask Dr Sylvie Farrier states it will not be necessary to ask Dr Katheren Puller was [facility] with not using Ibuprofen and will use Tylenol instead

## 2010-11-28 NOTE — Letter (Signed)
Summary: [facility] Office Visit   [facility] Visit   Imported By: Roderic Ovens 08/20/2010 16:15:37  _____________________________________________________________________  External Attachment:    Type:   Image     Comment:   External Document

## 2010-11-28 NOTE — Progress Notes (Signed)
Summary: Coumadin Refill  Phone Note Refill Request Call back at cell# [phone] Message from:  [facility] [address]  Refills Requested: Medication #1:  WARFARIN SODIUM 2 MG TABS Use as directed by Anticoagualtion Clinic pt is out and needs this asap/pls call when ready  Initial call taken by: Omer Jack,  Mar 22, 2010 10:21 AM    Prescriptions: WARFARIN SODIUM 2 MG TABS (WARFARIN SODIUM) Use as directed by Anticoagualtion Clinic  #90 x 0   Entered by:   Weston Brass PharmD   Authorized by:   Ronaldo Miyamoto, MD, [facility]   Signed by:   Weston Brass PharmD on 03/22/2010   Method used:   [facility]  [address] [id number]       [address]       [place], [place]  [address]       Ph: [phone]       Fax: [phone]   RxID:   [id number]

## 2010-11-28 NOTE — Medication Information (Signed)
Summary: rov/ewj  Anticoagulant Therapy  Managed by: Cloyde Reams, RN, BSN Referring MD: Shawnie Pons MD PCP: No PCP at this time Supervising MD: Myrtis Ser MD, Tinnie Gens Indication 1: Atrial Fibrillation (ICD-427.31) Lab Used: [facility] Site: [facility] INR POC 2.7 INR RANGE 2 - 3  Dietary changes: no    Health status changes: no    Bleeding/hemorrhagic complications: no    Recent/future hospitalizations: yes       Details: Pending vein procedure possibly next month.    Any changes in medication regimen? no    Recent/future dental: no  Any missed doses?: no       Is patient compliant with meds? yes       Allergies: No Known Drug Allergies  Anticoagulation Management History:      The patient is taking warfarin and comes in today for a routine follow up visit.  Positive risk factors for bleeding include an age of 73 years or older.  The bleeding index is 'intermediate risk'.  Positive CHADS2 values include History of HTN.  Negative CHADS2 values include Age > 56 years old.  The start date was 03/02/1998.  His last INR was 2.5 ratio.  Anticoagulation responsible provider: Myrtis Ser MD, Tinnie Gens.  INR POC: 2.7.  Cuvette Lot#: [id number].  Exp: 07/2011.    Anticoagulation Management Assessment/Plan:      The patient's current anticoagulation dose is Warfarin sodium 2 mg tabs: Use as directed by Anticoagualtion Clinic.  The target INR is 2.0-3.0.  The next INR is due 06/13/2010.  Anticoagulation instructions were given to patient.  Results were reviewed/authorized by Cloyde Reams, RN, BSN.  He was notified by Cloyde Reams RN.         Prior Anticoagulation Instructions: INR 2.5  Continue on same dosage 1/2 tablet daily except 1/4 tablet on Sundays.  Recheck in 4 weeks.   Current Anticoagulation Instructions: INR 2.7  Continue on same dosage 1/2 tablet daily except 1/4 tablet on Sundays.  Recheck in 4 weeks.

## 2010-11-28 NOTE — Letter (Signed)
Summary: Return To Work  [employer], Main Office  [address]   [place], [place] [address]   Phone: [phone]  Fax: [phone]    01/17/2010  TO: Leodis Sias IT MAY CONCERN   RE: Bradley Winters [address] [name]   The above named individual is under my medical care and may return to work on: January 18, 2010.    If you have any further questions or need additional information, please call.     Sincerely,    Racheal Patches, RN, BSN

## 2010-11-28 NOTE — Medication Information (Signed)
Summary: rov/tm  Anticoagulant Therapy  Managed by: Cloyde Reams, RN, BSN Referring MD: Shawnie Pons MD PCP: No PCP at this time Supervising MD: Eden Emms MD, Theron Arista Indication 1: Atrial Fibrillation (ICD-427.31) Lab Used: [facility] Site: [facility] INR POC 2.5 INR RANGE 2 - 3  Dietary changes: no    Health status changes: no    Bleeding/hemorrhagic complications: no    Recent/future hospitalizations: no    Any changes in medication regimen? yes       Details: Started on Metoprolol.  Recent/future dental: no  Any missed doses?: no       Is patient compliant with meds? yes       Allergies (verified): No Known Drug Allergies  Anticoagulation Management History:      The patient is taking warfarin and comes in today for a routine follow up visit.  Positive risk factors for bleeding include an age of 70 years or older.  The bleeding index is 'intermediate risk'.  Positive CHADS2 values include History of HTN.  Negative CHADS2 values include Age > 57 years old.  The start date was 03/02/1998.  His last INR was 2.5 ratio.  Anticoagulation responsible provider: Eden Emms MD, Theron Arista.  INR POC: 2.5.  Cuvette Lot#: [id number].  Exp: 02/2011.    Anticoagulation Management Assessment/Plan:      The patient's current anticoagulation dose is Warfarin sodium 2 mg tabs: Use as directed by Anticoagualtion Clinic.  The target INR is 2.0-3.0.  The next INR is due 02/28/2010.  Anticoagulation instructions were given to patient.  Results were reviewed/authorized by Cloyde Reams, RN, BSN.  He was notified by Cloyde Reams RN.         Prior Anticoagulation Instructions: INR 2.2  Continue 0.25 tab Sunday and 0.5 tab on all other days.  Recheck in 4 weeks.  Dosing confirmed in phone call to patient on 3/15 at 1040 am.  Current Anticoagulation Instructions: INR 2.5  Continue on same dosage 1/2 tablet daily except 1/4 tablet on Sundays.  Recheck in 4 weeks.

## 2010-11-28 NOTE — Assessment & Plan Note (Signed)
Summary: 2 month   Referring Provider:  Bonnee Quin, MD Primary Provider:  No PCP at this time  CC:  Rt. leg edema from Knee down.  History of Present Illness: Bradley Winters is in for a follow up visit.  Overall he is doing reasonably well, and seems to have adjusted to atrial fibrillation, and does tolerate his medications.  We had another thorough discussion regarding the genetics of HOCM, and meaning and he is going about the process of getting this settled with his family.  His functional status remains reasonable.  Denies chest pain, syncope. or presyncope.  He has been follwed by EP, and failed two attempts at AF ablation.  He does have venous incompetence, and swelling particularly in the RLE.   Current Medications (verified): 1)  Warfarin Sodium 2 Mg Tabs (Warfarin Sodium) .... Use As Directed By Anticoagualtion Clinic 2)  Acetaminophen 500 Mg Tabs (Acetaminophen) .[name].. 1-2 By Mouth As Needed 3)  Diltiazem Hcl Er Beads 360 Mg Xr24h-Cap (Diltiazem Hcl Er Beads) .... Take One Capsule By Mouth Daily 4)  Furosemide 20 Mg Tabs (Furosemide) .... Take 2 Tablets Daily 5)  Tylenol 325 Mg Tabs (Acetaminophen) .... As Needed 6)  Potassium Chloride Crys Cr 20 Meq Cr-Tabs (Potassium Chloride Crys Cr) .... 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  He denies tobacco or ethanol  abuse having stopped smoking in 2000.  He is retired Audiological scientist.   Vital Signs:  Patient profile:   70 year old male Height:      72 inches Weight:       234.50 pounds BMI:     31.92 Pulse rate:   98 / minute Pulse rhythm:   irregular Resp:     18 per minute BP sitting:   122 / 80  (left arm) Cuff size:   large  Vitals Entered By: Bradley Winters (January 17, 2010 4:07 PM)  Physical Exam  General:  Well developed, well nourished, in no acute distress. Head:  normocephalic and atraumatic Eyes:  PERRLA/EOM intact; conjunctiva and lids normal. Lungs:  Clear bilaterally to auscultation and percussion.  Slight expiratory prolongation. Heart:  Irregularly irregular rhythm.  Murmur less prominent since atrial fibrillation.  No rubs  Murmur is SEM 2/6, no DM.  Cannot assess gallops Abdomen:  Bowel sounds positive; abdomen soft and non-tender without masses, organomegaly, or hernias noted. No hepatosplenomegaly. Extremities:  Venous incompetence with varicose veins R greater than L.    EKG  Procedure date:  01/17/2010  Findings:      Atrial fibrillation with controlled ventricular response, but barely.  Rate 989.  Left axis deviation.  Poor R wave progression.  Impression & Recommendations:  Problem # 1:  ATRIAL FIBRILLATION (ICD-427.31) Rate still is not controlled to well.  Perhaps low dose beta blockade could help with better rate control, even if modest.  Discussed with patient and he is agreeable.   His updated medication list for this problem includes:    Warfarin Sodium 2 Mg Tabs (Warfarin sodium) ..... Use as directed by anticoagualtion clinic    Metoprolol Succinate 25 Mg Xr24h-tab (Metoprolol succinate) .[name]... Take one tablet by mouth daily  Orders: EKG w/ Interpretation (93000)  Problem # 2:  HYPERTENSION, UNSPECIFIED (ICD-401.9) Watch BP and check His updated medication list for this problem includes:    Diltiazem Hcl Er Beads 360 Mg Xr24h-cap (Diltiazem hcl er beads) .[name]... Take one capsule by mouth daily    Furosemide 20 Mg Tabs (Furosemide) .[name]... Take 2 tablets daily    Metoprolol Succinate 25 Mg Xr24h-tab (Metoprolol  succinate) .[name]... Take one tablet by mouth daily  Problem # 3:  EDEMA (ICD-782.3) Continue diuretic.  reminded of need for K and to not take furosemide without.    Patient Instructions: 1)  Your physician recommends that you schedule a follow-up appointment in: 4 weeks 2)  Your physician has recommended you make the following change in your medication: Start Toprol XL 25 mg  one tablet daily. Prescriptions: METOPROLOL SUCCINATE 25 MG XR24H-TAB (METOPROLOL SUCCINATE) Take one tablet by mouth daily  #30 x 3   Entered by:   Bradley Gutting, RN, BSN   Authorized by:   Bradley Miyamoto, MD, Bradley Winters   Signed by:   Bradley Gutting, RN, BSN on 01/17/2010   Method used:   [facility]  [address] [id number]       [address]       [place], [place]  [address]       Ph: [phone]       Fax: [phone]   RxID:   [id number]

## 2010-11-28 NOTE — Progress Notes (Signed)
Summary: question on meds  Phone Note Call from Patient Call back at Home Phone [phone] Call back at [phone]   Caller: Patient Reason for Call: Talk to Nurse Summary of Call: pt has question on meds coumadin Initial call taken by: Roe Coombs,  August 07, 2010 3:25 PM  Follow-up for Phone Call        Pt had laser surgery done on leg today.  Concerned over restarting Coumadin.  He has a small incision- only an inch or so- on his leg and does not want it to bleed.  Informed patient that even if he starts Coumadin today it will take several days for his INR to increase.  Explained we want him to go ahead and restart Coumadin so we can minimize his risk of an embolic event.  Pt expressed understanding.  Will restart Coumadin at his normal dose in the morning.  Follow-up by: Weston Brass PharmD,  August 07, 2010 4:48 PM

## 2010-11-28 NOTE — Medication Information (Signed)
Summary: ccr/jss  Anticoagulant Therapy  Managed by: Eda Keys, PharmD Referring MD: Shawnie Pons MD PCP: No PCP at this time Supervising MD: Daleen Squibb MD, Maisie Fus Indication 1: Atrial Fibrillation (ICD-427.31) Lab Used: [facility] Site: [facility] INR POC 2.4 INR RANGE 2 - 3  Dietary changes: no    Health status changes: no    Bleeding/hemorrhagic complications: no    Recent/future hospitalizations: no    Any changes in medication regimen? no    Recent/future dental: no  Any missed doses?: yes     Details: One dose missed...  Is patient compliant with meds? yes       Allergies: No Known Drug Allergies  Anticoagulation Management History:      The patient is taking warfarin and comes in today for a routine follow up visit.  Positive risk factors for bleeding include an age of 3 years or older.  The bleeding index is 'intermediate risk'.  Positive CHADS2 values include History of HTN.  Negative CHADS2 values include Age > 49 years old.  The start date was 03/02/1998.  His last INR was 2.5 ratio.  Anticoagulation responsible provider: Daleen Squibb MD, Maisie Fus.  INR POC: 2.4.  Cuvette Lot#: [id number].  Exp: 05/2011.    Anticoagulation Management Assessment/Plan:      The patient's current anticoagulation dose is Warfarin sodium 2 mg tabs: Use as directed by Anticoagualtion Clinic.  The target INR is 2.0-3.0.  The next INR is due 04/18/2010.  Anticoagulation instructions were given to patient.  Results were reviewed/authorized by Eda Keys, PharmD.  He was notified by Eda Keys.         Prior Anticoagulation Instructions: INR 2.5  Continue on same dosage 1/2 tablet daily except 1/4 tablet on Sundays.  Recheck in 4 weeks.    Current Anticoagulation Instructions: INR 2.4  Continue taking 1/2 tablet everyday, except take 1/4 tablet on Sunday.  Return to clinic in 4 weeks.

## 2010-11-28 NOTE — Medication Information (Signed)
Summary: rov/tm  Anticoagulant Therapy  Managed by: Cloyde Reams, RN, BSN Referring MD: Shawnie Pons MD Supervising MD: Riley Kill MD, Maisie Fus Indication 1: Atrial Fibrillation (ICD-427.31) Lab Used: [facility] Site: [facility] INR POC 2.2 INR RANGE 2 - 3  Dietary changes: no    Health status changes: yes       Details: Awakens each am with sinus HA.  Bleeding/hemorrhagic complications: yes       Details: Sl amt of nosebleeds occassionally from sinuses.  Recent/future hospitalizations: no    Any changes in medication regimen? no    Recent/future dental: no  Any missed doses?: no       Is patient compliant with meds? yes       Current Medications (verified): 1)  Warfarin Sodium 2 Mg Tabs (Warfarin Sodium) .... Use As Directed By Anticoagualtion Clinic 2)  Acetaminophen 500 Mg Tabs (Acetaminophen) .[name].. 1-2 By Mouth As Needed 3)  Diltiazem Hcl Er Beads 300 Mg Xr24h-Cap (Diltiazem Hcl Er Beads) .... Take One Capsule By Mouth Daily 4)  Furosemide 20 Mg Tabs (Furosemide) .... Take One Tablet By Mouth Daily. 5)  Tylenol 325 Mg Tabs (Acetaminophen) .... As Needed 6)  Potassium Chloride Crys Cr 20 Meq Cr-Tabs (Potassium Chloride Crys Cr) .... Take One Tablet By Mouth Daily  Allergies (verified): No Known Drug Allergies  Anticoagulation Management History:      The patient is taking warfarin and comes in today for a routine follow up visit.  Positive risk factors for bleeding include an age of 76 years or older.  The bleeding index is 'intermediate risk'.  Positive CHADS2 values include History of HTN.  Negative CHADS2 values include Age > 7 years old.  The start date was 03/02/1998.  His last INR was 2.5 ratio.  Anticoagulation responsible provider: Riley Kill MD, Maisie Fus.  INR POC: 2.2.  Cuvette Lot#: [id number].  Exp: 11/2010.    Anticoagulation Management Assessment/Plan:      The patient's current anticoagulation dose is Warfarin sodium 2 mg tabs: Use as directed by Anticoagualtion  Clinic.  The target INR is 2.0-3.0.  The next INR is due 11/26/2009.  Anticoagulation instructions were given to patient.  Results were reviewed/authorized by Cloyde Reams, RN, BSN.  He was notified by Cloyde Reams RN.         Prior Anticoagulation Instructions: INR 2.6 Continue 1mg s daily except 0.5mg s on Sundays. Recheck in 4 weeks.   Current Anticoagulation Instructions: INR 2.2  Continue on same dosage 1mg  daily except 0.5mg  on Sundays.  Recheck in 4 weeks.

## 2010-11-28 NOTE — Assessment & Plan Note (Addendum)
Summary: f61m   Visit Type:  3 months follow up Referring Marayah Higdon:  Bonnee Quin, MD Primary Paola Aleshire:  No PCP at this time  CC:  No complains.  History of Present Illness: Bradley Winters is in for followup.  He is actually doing quite well, and without much in the way of symptoms.  We discussed the possibility of another AF consult, [facility] specifically, as he has requested, and I told him I would check into it for him.  He is not symptomatic at this time, and thinks medication adjustment has helped.  His tremor is better off of Amiodarone.  Denies any chest pain.  We have discussed family genetics on many an occasion.  Current Medications (verified): 1)  Warfarin Sodium 2 Mg Tabs (Warfarin Sodium) .... Use As Directed By Anticoagualtion Clinic 2)  Furosemide 20 Mg Tabs (Furosemide) .... Take 2 Tablets Daily 3)  Tylenol 325 Mg Tabs (Acetaminophen) .... As Needed 4)  Potassium Chloride Crys Cr 20 Meq Cr-Tabs (Potassium Chloride Crys Cr) .... Take 1 Tablet By Mouth Once A Day 5)  Metoprolol Succinate 25 Mg Xr24h-Tab (Metoprolol Succinate) .... Take One Tablet By Mouth Daily 6)  Diltiazem Hcl Er Beads 360 Mg Xr24h-Cap (Diltiazem Hcl Er Beads) .... Take One Capsule By Mouth Daily  Allergies (verified): No Known Drug Allergies  Vital Signs:  Patient profile:   70 year old male Height:      72 inches Weight:      232.50 pounds BMI:     31.65 Pulse rate:   77 / minute Pulse rhythm:   regular Resp:     18 per minute BP sitting:   102 / 70  (left arm) Cuff size:   large  Vitals Entered By: Vikki Ports (May 31, 2010 3:45 PM)  Physical Exam  General:  Well developed, well nourished, in no acute distress. Head:  normocephalic and atraumatic Eyes:  PERRLA/EOM intact; conjunctiva and lids normal. Lungs:  Clear bilaterally to auscultation and percussion. Heart:  Irregularly, irregular.  Murmur less prominent on current exam.  2/6.  No diastolic murmur noted.  Abdomen:  Bowel sounds  positive; abdomen soft and non-tender without masses, organomegaly, or hernias noted. No hepatosplenomegaly. Extremities:  Chronic venous stasis changes with some ulceration.   Neurologic:  Alert and oriented x 3.  Borderline tremor, much improved.     EKG  Procedure date:  05/31/2010  Findings:      Atrial fib.  LAD.  Nonspecific ST and T abnormality.  No acute changes.  Lower voltage.   Impression & Recommendations:  Problem # 1:  HYPERTROPHIC CARDIOMYOPATHY (ICD-425.1) Has some edema, but chronic venous changes..  Currently stable   The following medications were removed from the medication list:    Diltiazem Hcl Er Beads 360 Mg Xr24h-cap (Diltiazem hcl er beads) .[name]... Take one capsule by mouth daily His updated medication list for this problem includes:    Warfarin Sodium 2 Mg Tabs (Warfarin sodium) ..... Use as directed by anticoagualtion clinic    Furosemide 20 Mg Tabs (Furosemide) .[name]... Take 2 tablets daily    Metoprolol Succinate 25 Mg Xr24h-tab (Metoprolol succinate) .[name]... Take one tablet by mouth daily    Diltiazem Hcl Er Beads 360 Mg Xr24h-cap (Diltiazem hcl er beads) .[name]... Take one capsule by mouth daily  Problem # 2:  ATRIAL FIBRILLATION (ICD-427.31) Failed attempt at ablation in past.  Rate currently controlled, and doing well on warfarin.  Consider alternative referral. His updated medication list for this  problem includes:    Warfarin Sodium 2 Mg Tabs (Warfarin sodium) ..... Use as directed by anticoagualtion clinic    Metoprolol Succinate 25 Mg Xr24h-tab (Metoprolol succinate) .[name]... Take one tablet by mouth daily  Problem # 3:  EDEMA (ICD-782.3) Seeing Dr. Arbie Cookey on [facility], and getting vein treatment.    Problem # 4:  TREMOR (ICD-781.0) seems better off of Amiodarone.   Patient Instructions: 1)  Your physician recommends that you schedule a follow-up appointment in: 6 MONTHS 2)  Your physician recommends that you return for lab work in: PLEASE SCHED FASTING L and L,  BMET Prescriptions: DILTIAZEM HCL ER BEADS 360 MG XR24H-CAP (DILTIAZEM HCL ER BEADS) Take one capsule by mouth daily  #90 x 3   Entered by:   Ledon Snare, RN   Authorized by:   Ronaldo Miyamoto, MD, [facility]   Signed by:   Ledon Snare, RN on 05/31/2010   Method used:   Electronically to        [facility] [id number] ([facility])       [address]       [place], [place]  [address]       Ph: [phone]       Fax: [phone]   RxID:   [id number]

## 2010-11-28 NOTE — Medication Information (Signed)
Summary: rov/sp  Anticoagulant Therapy  Managed by: Lyna Poser, PharmD Referring MD: Shawnie Pons MD PCP: No PCP at this time Supervising MD: Shirlee Latch MD,Dalton Indication 1: Atrial Fibrillation (ICD-427.31) Lab Used: [facility] Site: [facility] INR POC 2.2 INR RANGE 2 - 3  Dietary changes: no    Health status changes: no    Bleeding/hemorrhagic complications: yes       Details: procedure in october on legs by vein specialist, can't remember the name.  Recent/future hospitalizations: no    Any changes in medication regimen? no    Recent/future dental: no  Any missed doses?: no       Is patient compliant with meds? yes       Allergies: No Known Drug Allergies  Anticoagulation Management History:      Positive risk factors for bleeding include an age of 70 years or older.  The bleeding index is 'intermediate risk'.  Positive CHADS2 values include History of HTN.  Negative CHADS2 values include Age > 83 years old.  The start date was 03/02/1998.  His last INR was 2.5 ratio.  Anticoagulation responsible provider: Shirlee Latch MD,Dalton.  INR POC: 2.2.  Cuvette Lot#: [id number].  Exp: 07/2011.    Anticoagulation Management Assessment/Plan:      The patient's current anticoagulation dose is Warfarin sodium 2 mg tabs: Use as directed by Anticoagualtion Clinic.  The target INR is 2.0-3.0.  The next INR is due 08/15/2010.  Anticoagulation instructions were given to patient.  Results were reviewed/authorized by Lyna Poser, PharmD.         Prior Anticoagulation Instructions: INR 2.7  Continue on same dosage 1/2 tablet daily except 1/4 tablet on Sundays.  Recheck in 4 weeks.    Current Anticoagulation Instructions: INR 2.2 Continue taking a 1/2 tablet everyday except on Sunday. On sunday take a 1/4 tablet. We will see you on the 20th after procedure. We will call you with instructions once you get clearance from Dr. Riley Kill.

## 2010-12-10 DIAGNOSIS — I4891 Unspecified atrial fibrillation: Secondary | ICD-10-CM

## 2010-12-10 DIAGNOSIS — I4892 Unspecified atrial flutter: Secondary | ICD-10-CM

## 2010-12-11 ENCOUNTER — Encounter: Payer: Self-pay | Admitting: Cardiology

## 2010-12-11 ENCOUNTER — Other Ambulatory Visit (INDEPENDENT_AMBULATORY_CARE_PROVIDER_SITE_OTHER): Payer: Medicare Other | Admitting: Vascular Surgery

## 2010-12-11 ENCOUNTER — Telehealth: Payer: Self-pay | Admitting: Cardiology

## 2010-12-11 DIAGNOSIS — I83893 Varicose veins of bilateral lower extremities with other complications: Secondary | ICD-10-CM

## 2010-12-12 NOTE — Progress Notes (Signed)
Summary: [facility] Visit  [facility] Visit   Imported By: Earl Many 11/26/2010 16:07:53  _____________________________________________________________________  External Attachment:    Type:   Image     Comment:   External Document

## 2010-12-12 NOTE — Assessment & Plan Note (Signed)
OFFICE VISIT  Bradley Winters, Bradley Winters DOB:  03-28-1941                                       12/11/2010 [id number]#:[id number]  The patient presents today for left great saphenous vein laser ablation for treatment of superficial venous hypertension.  This was uneventful under local anesthesia from below the mid calf to just below the saphenofemoral junction.  He had no immediate complication and was discharged to home.  He will be seen again in 1 week with follow-up duplex.  He was given a prescription for Tylox #30, one or two q.4 hours p.r.n. pain since he is unable to take ibuprofen due to other medical conditions.    Larina Earthly, M.D.  TFE/MEDQ  D:  12/11/2010  T:  12/12/2010  Job:  [id number]  cc:   Arturo Morton. Riley Kill, MD, [facility]

## 2010-12-18 ENCOUNTER — Encounter (INDEPENDENT_AMBULATORY_CARE_PROVIDER_SITE_OTHER): Payer: Medicare Other

## 2010-12-18 ENCOUNTER — Ambulatory Visit (INDEPENDENT_AMBULATORY_CARE_PROVIDER_SITE_OTHER): Payer: Medicare Other | Admitting: Vascular Surgery

## 2010-12-18 DIAGNOSIS — Z48812 Encounter for surgical aftercare following surgery on the circulatory system: Secondary | ICD-10-CM

## 2010-12-18 DIAGNOSIS — I83893 Varicose veins of bilateral lower extremities with other complications: Secondary | ICD-10-CM

## 2010-12-18 NOTE — Progress Notes (Signed)
Summary: pain meds  Phone Note Call from Patient Call back at [phone]   Caller: Patient Reason for Call: Talk to Nurse Summary of Call: pt has question re meds for pain. Initial call taken by: Roe Coombs,  December 11, 2010 3:01 PM  Follow-up for Phone Call        I spoke with the pt and he just had vein procedure on his legs.  The pt said surgeon wrote Rx for Tylox (oxycodone/acetaminophen) and the pt wanted to know if this is okay to take.  I made the pt aware that he should not take plain Tylenol along with Tylox because this med already contains Tylenol.  Follow-up by: Julieta Gutting, RN, BSN,  December 11, 2010 3:51 PM

## 2010-12-19 ENCOUNTER — Encounter: Payer: Self-pay | Admitting: Internal Medicine

## 2010-12-19 ENCOUNTER — Encounter (INDEPENDENT_AMBULATORY_CARE_PROVIDER_SITE_OTHER): Payer: Medicare Other

## 2010-12-19 DIAGNOSIS — Z7901 Long term (current) use of anticoagulants: Secondary | ICD-10-CM

## 2010-12-19 DIAGNOSIS — I4891 Unspecified atrial fibrillation: Secondary | ICD-10-CM

## 2010-12-19 NOTE — Assessment & Plan Note (Signed)
OFFICE VISIT  ZIMRI, BRENNEN DOB:  1940-11-09                                       12/18/2010 EXBMW#:[id number]  Egan Berkheimer presents today for follow-up 1 week following left leg great saphenous vein laser ablation.  He has the usual amount of soreness and this is resolving.  He underwent repeat duplex today in our office and this reveals successful ablation of his left great saphenous vein from the distal insertion site to just below the saphenofemoral function.  He had similar treatment on the right leg several weeks ago. I am quite pleased with his result as is Mr. Abernethy.  I plan to see him again on an as-needed basis.    Larina Earthly, M.D. Electronically Signed  TFE/MEDQ  D:  12/18/2010  T:  12/19/2010  Job:  [id number]

## 2010-12-24 NOTE — Medication Information (Signed)
Summary: rov/sp  Anticoagulant Therapy  Managed by: Weston Brass, PharmD Referring MD: Shawnie Pons MD PCP: No PCP at this time Supervising MD: Ladona Ridgel MD, Sharlot Gowda Indication 1: Atrial Fibrillation (ICD-427.31) Lab Used: [facility] Site: [facility] INR POC 1.7 INR RANGE 2 - 3  Dietary changes: no    Health status changes: no    Bleeding/hemorrhagic complications: no    Recent/future hospitalizations: no    Any changes in medication regimen? no    Recent/future dental: no  Any missed doses?: yes     Details: Missed 3 days around valentines day due to a procedure and then he restarted the next day.    Is patient compliant with meds? yes       Allergies: No Known Drug Allergies  Anticoagulation Management History:      The patient is taking warfarin and comes in today for a routine follow up visit.  Positive risk factors for bleeding include an age of 47 years or older.  The bleeding index is 'intermediate risk'.  Positive CHADS2 values include History of HTN.  Negative CHADS2 values include Age > 49 years old.  The start date was 03/02/1998.  His last INR was 2.  Anticoagulation responsible provider: Ladona Ridgel MD, Sharlot Gowda.  INR POC: 1.7.  Cuvette Lot#: [id number].  Exp: 11/2011.    Anticoagulation Management Assessment/Plan:      The patient's current anticoagulation dose is Warfarin sodium 2 mg tabs: Use as directed by Anticoagualtion Clinic.  The target INR is 2.0-3.0.  The next INR is due 01/09/2011.  Anticoagulation instructions were given to patient.  Results were reviewed/authorized by Weston Brass, PharmD.  He was notified by Margot Chimes PharmD Candidate.         Prior Anticoagulation Instructions: INR 2.0  Coumadin 2 mg tablets - Continue 1/2 tablet every day except 1/4 tablet on Sundays (or 1/2 of 1 mg tablet)  Current Anticoagulation Instructions: INR 1.7  Take an extra 1/4 tablet today and then resume your normal dose of 1/2 tablet daily except on Sundays when you  take 1/4 tablet.  Recheck INR in 3 weeks.

## 2010-12-24 NOTE — Procedures (Unsigned)
DUPLEX DEEP VENOUS EXAM - LOWER EXTREMITY  INDICATION:  Status post left greater saphenous vein ablation.  HISTORY:  Edema:  No. Trauma/Surgery:  Left greater saphenous vein ablation, 12/11/2010. Pain:  Yes. PE:  No. Previous DVT:  No. Anticoagulants:  No. Other:  DUPLEX EXAM:               CFV   SFV   PopV  PTV    GSV               R  L  R  L  R  L  R   L  R  L Thrombosis    o  o     o     o      o     + Spontaneous   +  +     +     +      +     o Phasic        +  +     +     +      +     o Augmentation  +  +     +     +      +     o Compressible  +  +     +     +      +     o Competent     +  +     +     +      +     +  Legend:  + - yes  o - no  p - partial  D - decreased  IMPRESSION: 1. No evidence of acute deep venous thrombosis. 2. Successful left greater saphenous vein ablation from the distal     insertion site to the saphenofemoral junction.   _____________________________ Larina Earthly, M.D.  OD/MEDQ  D:  12/18/2010  T:  12/18/2010  Job:  [id number]

## 2011-01-06 NOTE — Progress Notes (Signed)
Addended byBethena Midget on: 01/06/2011 12:18 PM   Modules accepted: Orders

## 2011-01-09 ENCOUNTER — Encounter (INDEPENDENT_AMBULATORY_CARE_PROVIDER_SITE_OTHER): Payer: Medicare Other

## 2011-01-09 ENCOUNTER — Encounter: Payer: Self-pay | Admitting: Internal Medicine

## 2011-01-09 DIAGNOSIS — I4891 Unspecified atrial fibrillation: Secondary | ICD-10-CM

## 2011-01-09 DIAGNOSIS — Z7901 Long term (current) use of anticoagulants: Secondary | ICD-10-CM

## 2011-01-09 LAB — CONVERTED CEMR LAB: POC INR: 1.9

## 2011-01-10 ENCOUNTER — Ambulatory Visit: Payer: Medicare Other | Admitting: Cardiology

## 2011-01-14 NOTE — Medication Information (Signed)
Summary: Bradley Winters  Anticoagulant Therapy  Managed by: Bethena Midget, RN, BSN Referring MD: Shawnie Pons MD PCP: No PCP at this time Supervising MD: Teddie Curd MD, Fayrene Fearing Indication 1: Atrial Fibrillation (ICD-427.31) Lab Used: [facility]  Site: [facility] INR POC 1.9 INR RANGE 2 - 3  Dietary changes: no    Health status changes: no    Bleeding/hemorrhagic complications: no    Recent/future hospitalizations: no    Any changes in medication regimen? no    Recent/future dental: no  Any missed doses?: no       Is patient compliant with meds? yes       Allergies: No Known Drug Allergies  Anticoagulation Management History:      The patient is taking warfarin and comes in today for a routine follow up visit.  Positive risk factors for bleeding include an age of 70 years or older.  The bleeding index is 'intermediate risk'.  Positive CHADS2 values include History of HTN.  Negative CHADS2 values include Age > 51 years old.  The start date was 03/02/1998.  His last INR was 2.  Anticoagulation responsible provider: Britten Parady MD, Fayrene Fearing.  INR POC: 1.9.  Cuvette Lot#: [id number].  Exp: 12/2011.    Anticoagulation Management Assessment/Plan:      The patient's current anticoagulation dose is Warfarin sodium 2 mg tabs: Use as directed by Anticoagualtion Clinic.  The target INR is 2.0-3.0.  The next INR is due 01/23/2011.  Anticoagulation instructions were given to patient.  Results were reviewed/authorized by Bethena Midget, RN, BSN.  He was notified by Bethena Midget, RN, BSN.         Prior Anticoagulation Instructions: INR 1.7  Take an extra 1/4 tablet today and then resume your normal dose of 1/2 tablet daily except on Sundays when you take 1/4 tablet.  Recheck INR in 3 weeks.  Current Anticoagulation Instructions: INR 1.9 Today take 1/2  of pink pill (extra) then change dose to 1mg  everyday.  Recheck in 2 weeks.

## 2011-01-14 NOTE — Progress Notes (Signed)
Summary: [facility]: Office Visit  [facility]: Office Visit   Imported By: Earl Many 01/03/2011 10:20:50  _____________________________________________________________________  External Attachment:    Type:   Image     Comment:   External Document

## 2011-01-15 LAB — CBC
Platelets: 125 10*3/uL — ABNORMAL LOW (ref 150–400)
RDW: 14.3 % (ref 11.5–15.5)

## 2011-01-15 LAB — DIFFERENTIAL
Basophils Absolute: 0 10*3/uL (ref 0.0–0.1)
Lymphocytes Relative: 17 % (ref 12–46)
Neutro Abs: 6.1 10*3/uL (ref 1.7–7.7)

## 2011-01-15 LAB — POCT I-STAT, CHEM 8
BUN: 19 mg/dL (ref 6–23)
Chloride: 107 mEq/L (ref 96–112)
Sodium: 142 mEq/L (ref 135–145)

## 2011-01-15 LAB — BRAIN NATRIURETIC PEPTIDE: Pro B Natriuretic peptide (BNP): <30 pg/mL (ref 0.0–100.0)

## 2011-01-23 ENCOUNTER — Ambulatory Visit (INDEPENDENT_AMBULATORY_CARE_PROVIDER_SITE_OTHER): Payer: Medicare Other | Admitting: *Deleted

## 2011-01-23 DIAGNOSIS — Z7901 Long term (current) use of anticoagulants: Secondary | ICD-10-CM

## 2011-01-23 DIAGNOSIS — I4891 Unspecified atrial fibrillation: Secondary | ICD-10-CM

## 2011-01-23 DIAGNOSIS — I4892 Unspecified atrial flutter: Secondary | ICD-10-CM

## 2011-01-23 LAB — POCT INR: INR: 2.2

## 2011-01-23 NOTE — Patient Instructions (Signed)
INR 2.2 Continue taking 1/2 tablet (1 mg) daily.  Recheck 4 weeks.

## 2011-01-31 LAB — PROTIME-INR: Prothrombin Time: 22.4 seconds — ABNORMAL HIGH (ref 11.6–15.2)

## 2011-02-02 LAB — PROTIME-INR
INR: 2.8 — ABNORMAL HIGH (ref 0.00–1.49)
Prothrombin Time: 31.4 seconds — ABNORMAL HIGH (ref 11.6–15.2)
Prothrombin Time: 31.5 seconds — ABNORMAL HIGH (ref 11.6–15.2)

## 2011-02-05 LAB — PROTIME-INR: INR: 2.3 — ABNORMAL HIGH (ref 0.00–1.49)

## 2011-02-10 LAB — CBC
Hemoglobin: 15.6 g/dL (ref 13.0–17.0)
MCHC: 33.1 g/dL (ref 30.0–36.0)
MCV: 93 fL (ref 78.0–100.0)
RBC: 5.09 MIL/uL (ref 4.22–5.81)

## 2011-02-10 LAB — BASIC METABOLIC PANEL
BUN: 25 mg/dL — ABNORMAL HIGH (ref 6–23)
CO2: 30 mEq/L (ref 19–32)
Calcium: 8.8 mg/dL (ref 8.4–10.5)
Chloride: 106 mEq/L (ref 96–112)
Creatinine, Ser: 1.3 mg/dL (ref 0.4–1.5)
GFR calc Af Amer: >60 mL/min (ref >60)
GFR calc non Af Amer: 55 mL/min — ABNORMAL LOW (ref >60)
Glucose, Bld: 91 mg/dL (ref 70–99)
Potassium: 3.6 mEq/L (ref 3.5–5.1)
Sodium: 141 mEq/L (ref 135–145)

## 2011-02-11 LAB — HEPATIC FUNCTION PANEL
ALT: 18 U/L (ref 0–53)
Alkaline Phosphatase: 59 U/L (ref 39–117)
Bilirubin, Direct: 0.1 mg/dL (ref 0.0–0.3)
Indirect Bilirubin: 0.7 mg/dL (ref 0.3–0.9)

## 2011-02-11 LAB — BASIC METABOLIC PANEL
Chloride: 104 mEq/L (ref 96–112)
GFR calc Af Amer: >60 mL/min (ref >60)
Potassium: 3.9 mEq/L (ref 3.5–5.1)
Sodium: 141 mEq/L (ref 135–145)

## 2011-02-11 LAB — CBC
HCT: 42.5 % (ref 39.0–52.0)
MCV: 93.5 fL (ref 78.0–100.0)
RBC: 4.55 MIL/uL (ref 4.22–5.81)
WBC: 6.9 10*3/uL (ref 4.0–10.5)

## 2011-02-11 LAB — AMIODARONE LEVEL: N-Desethyl-Amiodarone: 0.79 ug/mL (ref 0.25–3.50)

## 2011-02-11 LAB — APTT: aPTT: 38 seconds — ABNORMAL HIGH (ref 24–37)

## 2011-02-12 ENCOUNTER — Encounter: Payer: Self-pay | Admitting: Cardiology

## 2011-02-12 ENCOUNTER — Ambulatory Visit (INDEPENDENT_AMBULATORY_CARE_PROVIDER_SITE_OTHER): Payer: Medicare Other | Admitting: Cardiology

## 2011-02-12 ENCOUNTER — Ambulatory Visit (INDEPENDENT_AMBULATORY_CARE_PROVIDER_SITE_OTHER): Payer: Medicare Other | Admitting: *Deleted

## 2011-02-12 DIAGNOSIS — I4892 Unspecified atrial flutter: Secondary | ICD-10-CM

## 2011-02-12 DIAGNOSIS — I4891 Unspecified atrial fibrillation: Secondary | ICD-10-CM

## 2011-02-12 DIAGNOSIS — I10 Essential (primary) hypertension: Secondary | ICD-10-CM

## 2011-02-12 DIAGNOSIS — I421 Obstructive hypertrophic cardiomyopathy: Secondary | ICD-10-CM

## 2011-02-12 LAB — POCT INR: INR: 2.4

## 2011-02-12 NOTE — Patient Instructions (Signed)
Your physician recommends that you schedule a follow-up appointment in: *3 MONTHS** Your physician recommends that you return for lab work in: today--bmet

## 2011-02-12 NOTE — Assessment & Plan Note (Signed)
Rate is controlled at present time.   

## 2011-02-12 NOTE — Assessment & Plan Note (Signed)
Controlled at present time.

## 2011-02-12 NOTE — Progress Notes (Signed)
HPI:  Doing well.  No chest pain.  Has some hand issues.  His legs have improved recently with treatment.  Now wearing support hose.  No progressive shortness of breath.  Current Outpatient Prescriptions  Medication Sig Dispense Refill  . acetaminophen (TYLENOL) 325 MG tablet Take 650 mg by mouth every 6 (six) hours as needed.        . diltiazem (TIAZAC) 360 MG 24 hr capsule Take 1 tablet by mouth daily.      . furosemide (LASIX) 20 MG tablet Take 1 tablet by mouth daily.       . metoprolol succinate (TOPROL-XL) 25 MG 24 hr tablet Take 1 tablet by mouth daily.      . potassium chloride SA (K-DUR,KLOR-CON) 20 MEQ tablet Take by mouth daily. Take 1/2 tablet       . warfarin (COUMADIN) 2 MG tablet Take by mouth as directed.          No Known Allergies  Past Medical History  Diagnosis Date  . HTN (hypertension)   . Hypertrophic cardiomyopathy   . Persistent atrial fibrillation   . Venous insufficiency     No past surgical history on file.  Family History  Problem Relation Age of Onset  . Colon cancer      History   Social History  . Marital Status: Married    Spouse Name: N/A    Number of Children: N/A  . Years of Education: N/A   Occupational History  . retired    Social History Main Topics  . Smoking status: Former Smoker    Quit date: 10/27/1998  . Smokeless tobacco: Not on file  . Alcohol Use: No  . Drug Use: Not on file  . Sexually Active: Not on file   Other Topics Concern  . Not on file   Social History Narrative  . No narrative on file    ROS: Please see the HPI.  All other systems reviewed and negative.  PHYSICAL EXAM:  BP 120/68  Pulse 74  Resp 18  Ht 5\' 11"  (1.803 m)  Wt 227 lb (102.967 kg)  BMI 31.66 kg/m2  General: Well developed, well nourished, in no acute distress. Head:  Normocephalic and atraumatic. Neck: no JVD Lungs: Clear to auscultation and percussion. Heart:   Irregularly irregular rhythm.   SEM, no diastolic. Abdomen:   Normal bowel sounds; soft; non tender; no organomegaly Pulses: Pulses normal in all 4 extremities. Extremities: No clubbing or cyanosis. No edema. Neurologic: Alert and oriented x 3.  EKG:  Atrial fibrillation.  Left axis deviation.  Nonspecific T abnormality.  ASSESSMENT AND PLAN:

## 2011-02-12 NOTE — Assessment & Plan Note (Signed)
He seems to be doing well at present.

## 2011-02-13 ENCOUNTER — Encounter: Payer: Medicare Other | Admitting: *Deleted

## 2011-02-13 LAB — BASIC METABOLIC PANEL
GFR: 72.57 mL/min (ref >60.00)
Glucose, Bld: 85 mg/dL (ref 70–99)
Potassium: 3.8 mEq/L (ref 3.5–5.1)
Sodium: 141 mEq/L (ref 135–145)

## 2011-03-11 NOTE — Op Note (Signed)
NAME:  AUDLEY, HINOJOS NO.:  [id number]   MEDICAL RECORD NO.:  [id number]          PATIENT TYPE:  INP   LOCATION:  3709                         FACILITY:  [facility]   PHYSICIAN:  Bevelyn Buckles. Bensimhon, MDDATE OF BIRTH:  [birth year]   DATE OF PROCEDURE:  01/31/2008  DATE OF DISCHARGE:                               OPERATIVE REPORT   DATE OF CARDIOVERSION:  Date of cardioversion is January 31, 2008.   PATIENT IDENTIFICATION:  Mr. Sans is a very pleasant 70 year old  male with a history of hypertrophic obstructive cardiomyopathy.  He has  been suffering from symptomatic atrial fibrillation.  He is referred for  TEE guided cardioversion.  On arrival to the endoscopy unit his INR was  therapeutic.  He underwent TEE which showed normal LV function with a  left atrial dilatation and significant spontaneous echo contrast of the  left atrium, but no evidence of left atrial appendage clot.  He was  further sedated by anesthesia and underwent direct current cardioversion  with a single 150 joules synchronized biphasic shock with prompt  reversion to normal sinus rhythm.  There were no apparent complications.      Bevelyn Buckles. Bensimhon, MD  Electronically Signed     DRB/MEDQ  D:  01/31/2008  T:  01/31/2008  Job:  [id number]

## 2011-03-11 NOTE — Op Note (Signed)
NAME:  KEPLER, MCCABE NO.:  [id number]   MEDICAL RECORD NO.:  [id number]          PATIENT TYPE:  OIB   LOCATION:  2899                         FACILITY:  [facility]   PHYSICIAN:  Rollene Rotunda, MD, FACCDATE OF BIRTH:  06/22/1941   DATE OF PROCEDURE:  11/20/2008  DATE OF DISCHARGE:  11/20/2008                               OPERATIVE REPORT   PROCEDURE:  Direct current cardioversion.   INDICATIONS:  Symptomatic atrial fibrillation.   PROCEDURE NOTE:  The patient had been through this procedure before.  He  has hypertrophic cardiomyopathy and symptomatic atrial fibrillation.  This has been recurrent despite amiodarone therapy.  He is being  considered for atrial fibrillation ablation.  He has had therapeutic  Coumadin levels greater than 4 weeks documented.  He was seen by Dr.  Johney Frame and because of his symptoms was set up for elective cardioversion  today.  He signed informed consent.  Anesthesia was administered per Dr.  Jacklynn Bue.  He was given 160 mg of propofol.  He was monitored  throughout.  Ventilation was maintained with bag ventilation.  He was  successfully cardioverted x1 with 150 joules of biphasic synchronized  energy.  This resulted in normal sinus rhythm.  There were no apparent  complications.  EKG is pending.   PLAN:  The patient will continue with his amiodarone and his Coumadin.  He will see Dr. Johney Frame back for further consideration of atrial  fibrillation ablation.      Rollene Rotunda, MD, [facility]  Electronically Signed     JH/MEDQ  D:  11/20/2008  T:  11/21/2008  Job:  [id number]

## 2011-03-11 NOTE — Assessment & Plan Note (Signed)
[facility]                                 ON-CALL NOTE   ZAFIR, SCHAUER                   MRN:          [id number]  DATE:10/20/2008                            DOB:          02/05/1941    I spoke with Ms. Bradley Winters on October 20, 2008.  I received a page  to the answering service.  Ms. Norcia states that Quantrell Splitt is  having some fluttering in his chest today.  Upon checking his pulse, it  was found to be irregular, but not rapid.  Mr. Heinz denied any chest  pain, heaviness, tightness, or pressures, presyncope, or syncopal  episode, was not nauseated or vomiting, just complained of feeling his  heart beating irregular, which is here in October at which time he was  diagnosed with paroxysmal atrial fib.  Pending further evaluation by Dr.  Johney Frame for possible atrial fib ablation.  Mr. Reisig is anticoagulated  appropriately and on Coumadin 200 mg daily.  Speaking with the family at  that time, Mr. Rockett was asymptomatic, heart rate controlled.  He had  taken his amiodarone 200 mg last night at bedtime and his Coumadin.  I  instructed Ms. Woehler to go ahead and give him another 200 mg of his  amiodarone now and have him lay down and rest for little while.  If he  became symptomatic, he would need to be seen, if not, no further  followup needed immediately.  However, I informed her to notify Dr.  Rosalyn Charters nurse, Monday at the office, in case the patient needs to  follow up with Dr. Johney Frame for further consideration of ablation.  I have  discussed this with Dr. Myrtis Ser who is in agreement.      Dorian Pod, ACNP  Electronically Signed      Luis Abed, MD, [facility]  Electronically Signed   MB/MedQ  DD: 10/20/2008  DT: 10/20/2008  Job #: [id number]

## 2011-03-11 NOTE — Assessment & Plan Note (Signed)
OFFICE VISIT   Bradley Winters, Bradley Winters  DOB:  November 17, 1940                                       08/13/2010  EAVWU#:[id number]   Patient underwent laser ablation of his right great saphenous vein by  Dr. Arbie Cookey 1 week ago for venous hypertension and a history of some  chronic skin changes in the right lower extremity from chronic venous  insufficiency.  He has had some moderate discomfort in the proximal calf  adjacent to the entry sites.  He is unable to take ibuprofen, which he  cannot tolerate.  He is on chronic Coumadin therapy, which has been  resumed, this being for atrial fibrillation.  He states the discomfort  in the right thigh has decreased each day, and that is improving but he  is still concerned about the area just below the knee.  He has made a  dramatic improvement in decreased edema in the right lower leg, ankle,  and foot area.  He has been wearing his last compression stocking as  instructed.  He denies any chest pain, dyspnea on exertion, PND or  orthopnea.   PHYSICAL EXAMINATION:  He is alert and oriented x3.  Blood pressure  126/71, heart rate 93.  Chest:  Clear to auscultation.  Right lower  extremity reveals some mild-to-moderate tenderness along the great  saphenous vein from the saphenofemoral junction to the knee with an area  of erythema about 3 cm in diameter just proximal to the entrance site  below the knee.  He has a thickened, hyperpigmented skin distally as  before.   Venous duplex exam today reveals no evidence of DVT with total occlusion  of the right great saphenous vein from the saphenofemoral junction to  the entry site.   He was reassured regarding these findings.  He would like to talk to Dr.  Arbie Cookey before scheduling his next leg procedure, so we will have him  return in 2 to 3 weeks for follow-up unless he has any problems in the  interim.     Bradley Winters, M.D.  Electronically Signed   JDL/MEDQ  D:   08/13/2010  T:  08/14/2010  Job:  [id number]

## 2011-03-11 NOTE — H&P (Signed)
NAME:  Bradley Winters, HART NO.:  [id number]   MEDICAL RECORD NO.:  [id number]          PATIENT TYPE:  INP   LOCATION:  1827                         FACILITY:  [facility]   PHYSICIAN:  Rollene Rotunda, MD, FACCDATE OF BIRTH:  [birth year]   DATE OF ADMISSION:  01/29/2008  DATE OF DISCHARGE:                              HISTORY & PHYSICAL   PRIMARY CARE PHYSICIAN:  None.   CARDIOLOGIST:  Arturo Morton. Riley Kill, MD, [facility].   REASON FOR PRESENTATION:  Atrial fibrillation.   HISTORY OF PRESENT ILLNESS:  The patient is a pleasant but anxious 70-  year-old gentleman with recurrent atrial fibrillation.  He has most  recently been treated with amiodarone.  He has been maintained on  Coumadin.  However, on the amiodarone, he is now he is now presenting  with his third recurrent atrial fibrillation.  He was in atrial  fibrillation in November.  He required TEE cardioversion in January.  He  is back now.  His symptoms started at 2 p.m..  He feels tachy  palpitations.  He does not report chest discomfort, neck or arm  discomfort.  He does not describe shortness of breath.  He does not have  presyncope or syncope.  He does feel quite uncomfortable and anxious  with this arrhythmia.  He came to the emergency room, where indeed he  was in atrial fibrillation with a rapid ventricular rate.   The patient's patient other cardiac history includes hypertrophic  cardiomyopathy.  Again, he has been treated with amiodarone but no other  antiarrhythmics.  He was seen by EP the last time he was hospitalized.   PAST MEDICAL HISTORY:  Atrial fibrillation, colonic polyps, hypertrophic  cardiomyopathy (EF 55%, interventricular septum 14.1), mild-to-moderate  mitral regurgitation with mild left atrial enlargement.   ALLERGIES:  NONE.   MEDICATIONS:  Amiodarone 200 mg daily, Coumadin 1 mg daily except 2 mg  on Thursday.   SOCIAL HISTORY:  The patient is married.  He worked for a [employer] (medical).  He used  to smoke cigarettes.  He shoots competitively.   FAMILY HISTORY:  Noncontributory for early coronary disease, though his  father died of myocardial infarction at age 26.   REVIEW OF SYSTEMS:  As stated in the HPI, otherwise negative for other  systems.   PHYSICAL EXAMINATION:  The patient is pleasant and in no distress.  He  is quite anxious.  Blood pressure 118/80, heart rate 102 and irregular,  afebrile, respiratory rate 18.  HEENT:  Eyes unremarkable.  Pupils equal, round, reactive to light.  Fundi not visualized.  Oral mucosa unremarkable.  NECK:  No jugular distention at 45 degrees.  Carotid upstrokes brisk and  symmetrical.  No bruits, no thyromegaly.  LYMPHATICS:  No cervical, axillary, or inguinal adenopathy.  LUNGS:  Clear to auscultation bilaterally.  BACK:  No costovertebral angle tenderness.  CHEST:  Unremarkable.  HEART:  PMI not displaced or sustained.  S1 and S2 within normal limits.  No S3, 3/6 apical systolic murmur increased with the strain phase of  Valsalva.  No diastolic murmurs.  ABDOMEN:  Flat.  Positive bowel sounds.  Normal in frequency and pitch.  No bruits, rebound, guarding or midline pulsatile mass.  No hepatomegaly  or splenomegaly.  SKIN:  No rashes.  No nodules.  EXTREMITIES:  2+ pulses throughout.  Moderate right lower extremity  edema.  Mild left lower extremity edema.  Chronic venous stasis changes.  NEURO:  Oriented to person, place, and time.  Cranial nerves II-XII  grossly intact.  Motor grossly intact.   EKG:  Atrial fibrillation, leftward axis, poor anterior R-wave  progression, no acute ST-T wave changes.   LABS:  Sodium 142, potassium 4.2, BUN 21, creatinine 1.3, WBC 8.1,  hemoglobin 14.6, platelets 144, INR 2.5.   ASSESSMENT/PLAN:  Atrial fibrillation.  The patient has recurrent atrial  fibrillation.  He is symptomatic with this.  It would seem to me that  this is a failure of amiodarone.  At this point he needs to be  cardioverted  back to sinus rhythm for symptomatic improvement.  I would  do this in the emergency room but he is not sure that his INR was  therapeutic 2 weeks ago when it was last checked.  He was told was  low.  Therefore, I am going to admit him to the hospital, start a  little IV diltiazem for symptomatic improvement, and have his wife check  the blood work results that they have at home.  He will be made n.p.o.  after midnight.  If indeed his INR has been above 2 at that check 2  weeks ago and the previous check, then he can be cardioverted in the  morning.  If not, he will need to have a transesophageal echocardiogram-  guided cardioversion.  Long-term, I would discontinue the amiodarone,  wait for him to have recurrent atrial fibrillation and admit him for a  Tikosyn load and cardioversion.  The patient is very leery of  considering atrial fibrillation ablation.  Ultimately this needs to be  discussed and a strategy approved by Dr. Riley Kill.  Perhaps a  consultation with electrophysiology at the patient's request.      Rollene Rotunda, MD, [facility]  Electronically Signed     JH/MEDQ  D:  01/29/2008  T:  01/29/2008  Job:  [id number]   cc:   Arturo Morton. Riley Kill, MD, [facility]

## 2011-03-11 NOTE — Consult Note (Signed)
NEW PATIENT CONSULTATION   Bradley Winters, Bradley Winters  DOB:  02/16/1941                                       02/27/2010  ZDGUY#:[id number]   The patient presents today for evaluation of severe venous hypertension  in both legs, more so on his right than on his left.  He reports a  longstanding history of skin changes and edema.  He has had ulceration  over his right pretibial area and also right medial ankle.  He has  recently nearly healed these.  He has a longstanding history of  swelling.  He does not have any history of DVT.  He has not had any  bleeding from these.  He has had episodes of cellulitis in these areas  as well and has been treated with oral antibiotics.  He elevates his  legs when possible.  He does have some aching sensation with prolonged  standing.  He does have a history of a job that did require prolonged  periods of standing.  He also has a cardiomyopathy and history of atrial  fibrillation.  He is on chronic Coumadin therapy for this.  He has had  prior coronary arrhythmia ablations.   FAMILY HISTORY:  Negative for premature atherosclerotic disease.   SOCIAL HISTORY:  He is married with 4 children.  He is retired.  He quit  smoking 15 years ago.  He does not drink alcohol.   REVIEW OF SYSTEMS:  GENERAL:  Negative for weight loss or weight gain.  CARDIAC:  Positive for atrial fibrillation.  PULMONARY, GI, GU, VASCULAR, NEUROLOGIC, MUSCULOSKELETAL:  Negative.  PSYCHIATRIC:  Positive for anxiety.  HEENT, HEMATOLOGIC, SKIN:  Negative.   PHYSICAL EXAMINATION:  Well-developed, well-nourished white male  appearing stated age, in no acute distress.  Blood pressure 130/82,  pulse 64, respirations 24.  HEENT:  Normal.  Chest:  Clear bilaterally.  His radial and left posterior tibial pulses are palpable bilaterally.  Abdomen:  Nontender.  No masses.  Musculoskeletal:  Shows no major  deformities.  He does have marked edema from his knees distally and  has  marked changes of venous hypertension over his right pretibial area and  medial ankle with healed ulcers.  Neurologic:  He has no focal weakness  or paresthesia.  Skin:  Without ulcers or rashes other than his  pretibial area on the right.   He underwent noninvasive vascular laboratory studies in our office, and  I reviewed this with him.  He does have marked reflux throughout his  great saphenous vein bilaterally.  He does not have any reflux in his  small saphenous vein.  He has mild reflux in his deep system.  I had a  long discussion with the patient and his wife present.  I did explain  the nature of his skin changes and marked venous ulceration related to  venous hypertension.  He has not worn compression garments, and I  explained the indication for this to reduce the swelling and hopefully  reduce discomfort and recurrence of venous ulcerations.  He was fitted  today for 20 to 30 mmHg graduated compression stockings, thigh high.  I  plan to see him again in 3 months for continued discussion.  I did  explain that he is an outstanding candidate for ablation of the  saphenous vein should he fail conservative treatment.  We  will make  further determination of this pending his response after 3 months of  compression garments and we will see him again.  He understands that if  he would require ablation that we would stop his Coumadin for several  days prior to the procedure, as he has had similar treatment in the past  with a colonoscopy and we would confirm this with Dr. Riley Kill prior to  proceeding.  He will see me again in 3 months.     Larina Earthly, M.D.  Electronically Signed   TFE/MEDQ  D:  02/28/2010  T:  02/28/2010  Job:  [id number]   cc:   Arturo Morton. Riley Kill, MD, [facility]

## 2011-03-11 NOTE — Assessment & Plan Note (Signed)
[facility]                         ELECTROPHYSIOLOGY OFFICE NOTE   Bradley Winters, Bradley Winters                   MRN:          [id number]  DATE:11/02/2008                            DOB:          01/06/1941    INTRODUCTION:  Bradley Winters is a pleasant 70 year old gentleman with  hypertrophic obstructive cardiomyopathy, paroxysmal atrial fibrillation,  and typical-appearing atrial flutter by EKG, who presents today for  followup.   PROBLEM LIST:  1. Hypertrophic obstructive cardiomyopathy.  2. Paroxysmal atrial fibrillation.  3. Typical-appearing atrial flutter by EKG as documented on October 21, 2008.   CURRENT MEDICATIONS:  1. Amiodarone 200 mg twice daily.  2. Coumadin to maintain an INR between 2 and 3.   INTERVAL HISTORY:  Bradley Winters presents today for electrophysiologic  followup.  Since last being seen by me in clinic, he underwent  cardioversion by Dr. Lewayne Winters.  He presented with typical-appearing  atrial flutter on October 21, 2008, and was successfully cardioverted  to sinus rhythm.  His amiodarone was increased to 200 mg twice daily at  that time.  The patient reports no further symptomatic episodes of  atrial fibrillation or atrial flutter since that time.  He does,  however, note frequent breakthrough episodes of atrial fibrillation in  the past despite medical therapy with amiodarone.  During atrial  fibrillation, he is very symptomatic with symptoms of fatigue and  decreased exercise capacity.  He also reports shortness of breath.  He  denies presyncope or syncope and is otherwise without complaint today.   PHYSICAL EXAMINATION:  VITAL SIGNS:  Blood pressure 152/78, respirations  18, heart rate 61, weight 221 pounds.  GENERAL:  The patient is well-appearing male in no acute distress.  He  is alert and oriented x3.  HEENT:  Normocephalic, atraumatic.  Sclerae clear.  Conjunctivae pink.  Oropharynx clear.  NECK:   Supple.  No JVD, lymphadenopathy, or bruits.  LUNGS:  Clear to auscultation bilaterally.  HEART:  Regular rate and rhythm, 2/6 systolic ejection murmur along the  left sternal border as well as a 2/6 systolic ejection murmur at the  apex.  GI:  Soft, nontender, nondistended, positive bowel sounds.  EXTREMITIES:  No clubbing, cyanosis, or edema.  NEUROLOGIC:  Nonfocal.   EKG reveals normal sinus rhythm at 61 beats per minute with an  anteroseptal infarct pattern.  First-degree AV block is noted with a PR  interval of 222 milliseconds.  The QT duration is 470 milliseconds.   IMPRESSION:  Bradley Winters is a pleasant 70 year old gentleman with  hypertrophic obstructive cardiomyopathy, paroxysmal atrial fibrillation,  and typical-appearing atrial flutter, who presents today for followup.  He has done rather well since his recent cardioversion.  He is  tolerating an increased dose of amiodarone.  He is also therapeutic with  Coumadin without difficulties.   PLAN:  Therapeutic strategies for atrial fibrillation and atrial flutter  including both medicine and catheter-based therapies were again  discussed in detail with the patient in clinic by me today.  Risks,  benefits, and alternatives to EP study and radiofrequency ablation were  again discussed.  The patient wish to further contemplate this strategy,  but continue amiodarone at the present time.  I have taken the liberty  of continuing on amiodarone 200 mg twice daily for 2 more weeks.  We  will then decrease his amiodarone to 200 mg daily.  I have asked him to  follow up in clinic with me in 6 weeks.  We will obtain an amiodarone  level as well as liver function test and a thyroid profile at that time  for amiodarone surveillance.  The patient reports symptoms of subjective  apnea during sleep and we will therefore order a sleep study as well to  evaluate for sleep apnea.     Hillis Range, MD  Electronically Signed    JA/MedQ   DD: 11/02/2008  DT: 11/03/2008  Job #: [id number]   cc:   Bradley Morton. Riley Kill, MD, [facility] [place]  Bradley Canning. Ladona Ridgel, MD

## 2011-03-11 NOTE — H&P (Signed)
NAME:  SAMIT, SYLVE NO.:  [id number]   MEDICAL RECORD NO.:  [id number]          PATIENT TYPE:  EMS   LOCATION:  MAJO                         FACILITY:  [facility]   PHYSICIAN:  Doylene Canning. Ladona Ridgel, MD    DATE OF BIRTH:  07-28-1941   DATE OF ADMISSION:  10/21/2008  DATE OF DISCHARGE:  10/21/2008                              HISTORY & PHYSICAL   ADMITTING DIAGNOSIS:  Atrial fibrillation with a rapid ventricular  response in the setting of hypertrophic cardiomyopathy.   HISTORY OF PRESENT ILLNESS:  The patient is a 70 year old man who has a  history of known paroxysmal AFib with multiple cardioversions in the  past.  He has been on amiodarone at 200 mg daily.  He began to feel  palpitations approximately 2-3 days ago, and hoped that they would get  better, but they did not and he subsequently presented to the emergency  room where he was in atrial fibrillation with a rapid ventricular  response and 110 beats per minute.  This was associated with shortness  of breath, dyspnea with exertion, but no chest pain.  He noted that  since he has been in AFib, he has felt fatigued and weak and had a lack  of energy.  No anginal symptoms.  The patient denies peripheral edema.  He has had no frank syncope.  The patient does note symptoms of tremors  particularly in his right arm since he began amiodarone and there has  also been some visual changes on amiodarone as well.  He had seen by  partner, Dr. Hillis Range about AFib ablation, but was a little anxious  about proceeding with this.   MEDICATIONS:  1. Amiodarone 200 mg daily.  2. He is also on Cardizem.  3. He takes Coumadin and has been followed in our Coumadin Clinic with      INRs above 2.   He has a history of hypokalemia.   FAMILY HISTORY:  Notable for colon cancer.  His brother just died of  colon cancer.   SOCIAL HISTORY:  The patient is married.  He denies tobacco or ethanol  abuse having stopped smoking in 2000.   He is retired Building control surveyor.   REVIEW OF SYSTEMS:  Notable for occasional nocturia and also for some  arthralgias.  Otherwise, the rest of the review of systems was negative  except as noted in the HPI.   PHYSICAL EXAMINATION:  GENERAL:  He is a pleasant, middle-aged man in no  acute distress.  VITAL SIGNS:  Blood pressure today was 127/78, pulse was 105 and  irregularly irregular, respirations were 18-20, temperature was 98.  HEENT:  Normocephalic and atraumatic.  Pupils equal and round.  Oropharynx is moist.  Sclerae anicteric.  NECK:  No jugular venous distention.  No thyromegaly.  Trachea is  midline.  Carotids are 2+ and symmetric.  LUNGS:  Clear bilaterally to auscultation.  No wheezes, rales, or  rhonchi are present.  There is no increased work up breathing.  CARDIOVASCULAR:  Irregularly irregular tachycardia with normal S1 and  S2.  There is a very soft  systolic murmur at the left lower sternal  border.  PMI was not enlarged nor laterally displaced.  ABDOMEN:  Soft, nontender.  There is no organomegaly.  EXTREMITIES:  No cyanosis, clubbing, or edema.  Pulses were 2+ and  symmetric.  NEUROLOGIC:  Alert and oriented x3.  Cranial nerves are intact.  Strength 5/5 and symmetric.   EKG demonstrates atrial fibrillation with a rapid ventricular response.   IMPRESSION:  1. Atrial fibrillation with rapid ventricle response.  2. Hypertrophic cardiomyopathy.  3. Hypertension.  4. Chronic Coumadin therapy.   DISCUSSION:  Overall, Mr. Balding is stable, but is very symptomatic  from his atrial fibrillation.  If his INR today is normal and his  potassium is okay, we will plan on proceeding with direct-current  cardioversion as the patient has been n.p.o. since breakfast this  morning.  If this can be accomplished, then we will plan for early  discharge later today.      Doylene Canning. Ladona Ridgel, MD  Electronically Signed     GWT/MEDQ  D:  10/21/2008  T:  10/22/2008  Job:   [id number]   cc:   Veverly Fells. Excell Seltzer, MD

## 2011-03-11 NOTE — Discharge Summary (Signed)
NAME:  Bradley Winters, Bradley Winters NO.:  [id number]   MEDICAL RECORD NO.:  [id number]          PATIENT TYPE:  INP   LOCATION:  2918                         FACILITY:  [facility]   PHYSICIAN:  Hillis Range, MD       DATE OF BIRTH:  [birth year]   DATE OF ADMISSION:  12/07/2008  DATE OF DISCHARGE:  12/08/2008                               DISCHARGE SUMMARY   FINAL DIAGNOSES:  1. Persistent atrial fibrillation  2. Typical atrial flutter  3. Atypical atrial flutter  4. Hypertrophic Cardiomyopathy   PROCEDURE:  December 07, 2008, the patient had electrophysiology study  and RFA for atrial fibrillation and atrial flutter.  ]   [name] HISTORY/>  Mr. Redfield is a 70 year old male.  He has symptomatic atrial flutter  which seems to be typical in its appearance.  He also has history of  atrial fibrillation.  The patient has known this diagnosis for several  years and has been maintained on amiodarone therapy with good  suppression of both of these atrial arrhythmias.  However, recently has had breakthrough of his atrial fibrillation on  several occasions.  They have all required direct current cardioversion  in the emergency room.  He presented in consultation to Dr. Johney Frame on  November 20, 2008.  His symptoms with these recurrences of atrial  arrhythmias include palpitations, fatigue, and short of breath with  minimal activities.  The patient denies chest pain, presyncope, or  syncope.  The patient has recurrence of atrial fibrillation despite  antiarrhythmic.  Arrhythmia requires direct current cardioversion to  terminate.  The patient has been on Coumadin and is therapeutic over  quite a long period of time.  The risks and benefits of catheter-based  ablation procedure have been discussed with the patient and he wishes to  proceed.   HOSPITAL COURSE:  The patient presents electively on December 07, 2008.  He underwent electrophysiology study with extensive anatomical mapping.  He  had isolation encirclement of all 4 pulmonary veins with  radiofrequency catheter ablation.  He also had ablation of the  cavotricuspid isthmus.  The patient has had no postprocedural  complications.   The patient will continue on his medication, amiodarone 1 tablet twice  daily for the next 2 weeks and then 200 mg daily.  He has Coumadin 1 mg  daily except for 2 mg on Thursdays, and a home medication, Protonix 40  mg daily for 30 days.   He follows up at [facility], [address] and  Coumadin Clinic, Friday, December 15, 2008, at 3:15.  He will see Dr.  Riley Kill at that same appointment at 4 o'clock.  He sees Dr. Johney Frame,  Thursday, January 25, 2009, at 1:45.      Maple Mirza, [place]      Hillis Range, MD  Electronically Signed    GM/MEDQ  D:  12/08/2008  T:  12/08/2008  Job:  [id number]   cc:   Arturo Morton. Riley Kill, MD, [facility]

## 2011-03-11 NOTE — Discharge Summary (Signed)
NAMEMarland Winters  CLAVIN, RUHLMAN NO.:  [id number]   MEDICAL RECORD NO.:  [id number]          PATIENT TYPE:  INP   LOCATION:  2039                         FACILITY:  [facility]   PHYSICIAN:  Arturo Morton. Riley Kill, MD, FACCDATE OF BIRTH:  Jul 15, 1941   DATE OF ADMISSION:  08/17/2007  DATE OF DISCHARGE:  08/18/2007                               DISCHARGE SUMMARY   PRIMARY CARDIOLOGIST:  Maisie Fus D. Riley Kill, MD, [facility]   PRIMARY CARE PHYSICIAN:  Not listed.   FINAL DISCHARGE DIAGNOSES:  1. Atrial fibrillation with rapid ventricular response (RVR), status      post cardioversion.  2. Hypertrophic cardiomyopathy.   HISTORY OF PRESENT ILLNESS:  [name] is a 70 year old Caucasian  male with history of hypertrophic cardiomyopathy and paroxysmal atrial  fibrillation.  He has been maintained in sinus rhythm on amiodarone and  has not had an episode of atrial fibrillation in several years.  However, while working in his car this morning he suddenly did not fell  well and described an uncomfortable feeling in his chest and the  perception of his heart beating fast.  He noticed that his heart rate  was elevated and he came to our office for evaluation.   The patient was seen and examined by Dr. Tonny Bollman and found to  have a heart rate of 120 beats per minute, atrial fibrillation, RVR.  EKG did confirm atrial fibrillation, RVR and a ventricular rate of 121  beats per minute with left axis deviation and incomplete right bundle  branch block.  It was advised that after visit  the patient should be  hospitalized.  The patient was admitted and started on a Cardizem drip  after a Cardizem bolus.  The patient was seen and examined the following  day by Dr. Shawnie Pons and noted to have a blood pressure of 105/64.  Heart rate was 70 to 80 beats per minute on 5 mg of Diltiazem.  The  patient was planned for direct current cardioversion that day.   Attending, Dr. Olga Millers, directed  direct current cardioversion  after sedating the patient, and he was given synchronized cardioversion  biphasic at 120 joules and the patient resumed normal sinus rhythm with  ventricular rate in the 80s.  The patient had no complications  thereafter.   The patient was seen by Pharmacy also during his hospitalization for  evaluation of Coumadin therapy to maintain therapeutic INR of 2.1.  There were no changes in his Coumadin dosages.  The patient remained on  Cardizem until discharge, where he was seen and examined by myself and  found to be stable.  Also, Dr. Riley Kill had found him to be stable on his  followup examination, as well.  The patient will be discharged on  current medications of amiodarone with no changes in dose and Coumadin  with no changes in dose, with a follow-up appointment with Dr. Riley Kill  in approximately 2 weeks.   LABORATORY DATA:  Sodium 139, potassium 4.1, chloride 105, CO2 27,  glucose 108, BUN 16, creatinine 1.16.  TSH 5.193.  CBC:  Hemoglobin  15.0, hematocrit 45.1, white  blood cells 7.2, platelets 153.  PTT 34, PT  23.6, INR 2.0.   VITAL SIGNS ON DISCHARGE:  Blood pressure 110/66, heart rate 57,  respirations 20, temperature 97.1 with an O2 sat of 94% on room air.   DISCHARGE MEDICATIONS:  1. Amiodarone 200 mg once a day.  2. Coumadin 2 mg on Thursdays, 1 mg all other days.   ALLERGIES:  No known drug allergies.   FOLLOWUP PLANS AND APPOINTMENT:  1. The patient is to follow up with Dr. Shawnie Pons on August 31, 2007, at 3:45 p.m. for continued cardiac management.  2. The patient is to follow up in the Coumadin clinic on September 02, 2007, at 4:15 with a prior scheduled appointment for continued      PT/INR evaluation and adjustments as necessary.  The patient knows      to call our office if he has resumption of symptoms or rapid heart      rate.   Time spent with the patient, to include physician time, 30 minutes.      Bettey Mare.  Bradley Bishop, NP      Arturo Morton. Riley Kill, MD, [facility]  Electronically Signed    KML/MEDQ  D:  08/18/2007  T:  08/19/2007  Job:  [id number]

## 2011-03-11 NOTE — Procedures (Signed)
LOWER EXTREMITY VENOUS REFLUX EXAM   INDICATION:  Venous insufficiency/bilateral leg discoloration.   EXAM:  Using color-flow imaging and pulse Doppler spectral analysis, the  bilateral common femoral, superficial femoral, popliteal, posterior  tibial, greater and lesser saphenous veins are evaluated.  There is  evidence suggesting deep venous insufficiency in the bilateral lower  extremities.   The bilateral saphenofemoral junction is not competent with reflux of  >567milliseconds. The bilateral GSV's are not competent with reflux of  >545milliseconds with the caliber as described below.   The bilateral proximal short saphenous veins demonstrate competency.   GSV Diameter (used if found to be incompetent only)                                            Right    Left  Proximal Greater Saphenous Vein           0.91 cm  1.14 cm  Proximal-to-mid-thigh                     1.26 cm  1.09 cm  Mid thigh                                 0.83 cm  0.66 cm  Mid-distal thigh                          0.66 cm  cm  Distal thigh                              0.74 cm  1.13 cm  Knee                                      0.73 cm  0.64 cm   IMPRESSION:  1. Bilateral greater saphenous vein reflux with >542milliseconds is      identified with the caliber ranging from 0.66 cm to 1.2 cm knee to      groin in the right greater saphenous, and in the left greater      saphenous it is 0.64 cm to 1.14 cm.  2. The bilateral greater saphenous veins are not aneurysmal.  3. The bilateral greater saphenous veins are not tortuous.  4. The deep venous system is not competent with reflux of      >564milliseconds.  5. The bilateral lesser saphenous veins are competent.         ___________________________________________  Larina Earthly, M.D.   CB/MEDQ  D:  02/27/2010  T:  02/27/2010  Job:  [id number]

## 2011-03-11 NOTE — Assessment & Plan Note (Signed)
[facility]   NAME:  Bradley Winters, Bradley Winters            ACCOUNT NO.:  [id number]   MEDICAL RECORD NO.:  [id number]      DATE OF BIRTH:  1941/09/22   PHYSICIAN:  Madolyn Frieze. Jens Som, MD, [facility]  VISIT DATE:                                   OFFICE VISIT   SURGEON:  Madolyn Frieze. Jens Som, MD, [facility]   PROCEDURE:  Cardioversion of atrial fibrillation.   DESCRIPTION OF PROCEDURE:  The patient had a transesophageal  echocardiogram prior to the procedure that showed no left atrial  appendage thrombus.  He subsequently was sedated by Anesthesia with 150  mg of Pentothal intravenously.  Synchronized cardioversion with 120  joules (biphasic) resulted in normal sinus rhythm.  There were no  immediate complications.   PLAN:  We recommend continuing with the Coumadin.      Madolyn Frieze Jens Som, MD, [facility]  Electronically Signed     BSC/MEDQ  D:  11/26/2007  T:  11/26/2007  Job:  [id number]

## 2011-03-11 NOTE — Assessment & Plan Note (Signed)
OFFICE VISIT   IZACK, HOOGLAND  DOB:  1940-12-25                                       08/07/2010  ZDGUY#:[id number]   The patient presents today for treatment of his venous hypertension in  his right leg.  He underwent an uneventful laser ablation from mid calf  to the saphenofemoral junction with no immediate complications.  This  was under local anesthesia.  He was discharged and will be seen again in  1 week with ultrasound for followup.  He is scheduled for similar  treatment in his left leg in several weeks.     Larina Earthly, M.D.  Electronically Signed   TFE/MEDQ  D:  08/07/2010  T:  08/08/2010  Job:  [id number]   cc:   Arturo Morton. Riley Kill, MD, [facility]

## 2011-03-11 NOTE — Assessment & Plan Note (Signed)
OFFICE VISIT   Bradley Winters, Bradley Winters  DOB:  [birth year]                                       09/04/2010  ZOXWR#:[id number]   The patient presents today for continued follow-up of his right leg  laser ablation on 08/07/2010.  He continues to have resolution of  discomfort around the time of procedure.  He has more than the usual  amount of discomfort in his thigh and calf after the procedure probably  related to the fact that he was unable to take any ibuprofen.  This is  now completely resolved.  He does have some pulling sensation at the  site of the saphenous vein ablation.  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   He does have significant venous hypertension in the  left leg and wishes  to proceed with left leg laser ablation immediately after the holidays.  I explained that this is completely at his leisure as far as when this  is most convenient for his schedule.  We will treat him with narcotic  analgesia since he is unable to tolerate his ibuprofen treatment.   Will plan to treat his left leg at his convenience.  He will notify us  should he develop any difficulty in the interim.     Larina Earthly, M.D.  Electronically Signed   TFE/MEDQ  D:  09/04/2010  T:  09/05/2010  Job:  [id number]   cc:   Arturo Morton. Riley Kill, MD, [facility]

## 2011-03-11 NOTE — Procedures (Signed)
DUPLEX DEEP VENOUS EXAM - LOWER EXTREMITY   INDICATION:  Follow up right greater saphenous vein laser ablation.   HISTORY:  Edema:  Yes.  Trauma/Surgery:  Right greater saphenous vein laser ablation on  08/07/2010.  Pain:  Right medial leg tenderness.  PE:  No.  Previous DVT:  No.  Anticoagulants:  Other:   DUPLEX EXAM:                CFV   SFV   PopV  PTV    GSV                R  L  R  L  R  L  R   L  R  L  Thrombosis    o  o  o     o     o      +  Spontaneous   +  +  +     +     +      o  Phasic        +  +  +     +     +      o  Augmentation  +  +  +     +     +      o  Compressible  +  +  +     +     +      o  Competent     o  o  o     +     +      o   Legend:  + - yes  o - no  p - partial  D - decreased   IMPRESSION:  1. No evidence of deep venous thrombosis noted in the right lower      extremity.  2. The right greater saphenous vein appears totally occluded from the      distal insertion site to the saphenofemoral junction.  The right      saphenofemoral junction is competent.  3. The bilateral common femoral and right superficial femoral veins      demonstrate reflux.         _____________________________  Quita Skye. Hart Rochester, M.D.   CH/MEDQ  D:  08/14/2010  T:  08/14/2010  Job:  [id number]

## 2011-03-11 NOTE — Assessment & Plan Note (Signed)
[facility]                         ELECTROPHYSIOLOGY OFFICE NOTE   GEORGES, VICTORIO                   MRN:          [id number]  DATE:12/13/2008                            DOB:          03-Jan-1941    INTRODUCTION:  Bradley Winters is a pleasant 70 year old gentleman with a  history of atrial fibrillation and atrial flutter status post EP study  and radiofrequency ablation, December 07, 2008, who presents today for  followup.   PROBLEM LIST:  1. Persistent atrial fibrillation and typical appearing atrial flutter      status post electrophysiology study and radiofrequency ablation,      December 07, 2008.  2. Hypertrophic cardiomyopathy.   CURRENT MEDICATIONS:  1. Coumadin 1 mg daily except for 2 mg on Thursdays.  2. Amiodarone 200 mg b.i.d.  3. Protonix 40 mg daily.   INTERVAL HISTORY:  Bradley Winters presents today for followup after his  recent catheter ablation procedure.  He reports being in good health  until approximately 10 o'clock last night when he developed recurrent  palpitations.  He reports mild shortness of breath.  Over the past few  days, he has also noticed significant swelling with his right lower  extremity.  He denies chest pain, dysphasia, neurologic sequela, or  other concerns.   PHYSICAL EXAMINATION:  VITAL SIGNS:  Blood pressure 138/84, heart rate  191, respirations 18, weight 225 pounds.  GENERAL:  The patient is a well-appearing male in no acute distress.  He  is alert and oriented x3.  HEENT:  Normocephalic, atraumatic.  Sclerae clear.  Conjunctivae pink.  Oropharynx clear.  NECK:  Supple.  No JVD, thyromegaly, or bruits.  LUNGS:  Clear to auscultation bilaterally.  HEART:  Irregularly irregular rhythm.  No murmurs, rubs, or gallops.  GI:  Soft, nontender, nondistended.  Positive bowel sounds.  EXTREMITIES:  No clubbing, cyanosis.  He has chronic venous stasis  changes with 2+ right lower extremity and 1+ left  lower extremity edema.  He has moderate ecchymoses of the right groin with no hematoma or  bruits.  NEUROLOGIC:  Cranial nerves II through XII are intact.  Strength and  sensation are intact.   EKG reveals atypical flutter with an average ventricular rate of 90  beats per minute with nonspecific ST/T-wave changes.   A venous Doppler of the right lower extremity preliminary report reveals  no evidence of DVT.   IMPRESSION:  Bradley Winters is a pleasant 69 year old gentleman with  persistent atrial fibrillation and atrial flutter.  He presents today  with atypical atrial flutter.  He also has lower extremity edema which  he feels is stable and actually improving.  His right groin has moderate  ecchymoses but is otherwise benign.   PLAN:  1. Amiodarone is increased to 200 mg twice daily today.  2. The patient is instructed to decrease his Coumadin to 1 mg daily.  3. If the patient continues to have atypical atrial flutter, then we      will have him return in 48 hours for cardioversion.  4. The patient is instructed to contact our office should any other  problems arise.     Hillis Range, MD  Electronically Signed    JA/MedQ  DD: 12/13/2008  DT: 12/14/2008  Job #: [id number]

## 2011-03-11 NOTE — Assessment & Plan Note (Signed)
[facility]                            CARDIOLOGY OFFICE NOTE   LAQUINCY, EASTRIDGE                   MRN:          [id number]  DATE:12/28/2008                            DOB:          1941-03-10    [name] is in today for a walk-in visit.  He thinks he went on a  rhythm earlier today.  He has had an atrial flutter ablation and then  developed recurrent atrial flutter and was cardioverted.  He notices the  difference, and is now back in what appears to be atrial flutter.  He  has seen Dr. Johney Frame on several occasions and he has been placed on  fairly high-dose amiodarone.   PHYSICAL EXAMINATION:  The blood pressure is 106/78, the pulse is 99.  He has his murmur which is variable, but it is compatible with  hypertrophic cardiomyopathy.  There is no extremity edema.   EKG reveals atrial flutter with controlled ventricular response rate of  99.  The QT interval was 503 msec.   [name] is in atrial flutter, I am going to have him see Dr. Johney Frame  tomorrow.  I have asked him to limit his activity and call tonight  if  there is any progressive problems.  He will see [place] in followup at that  time.  He may require cardioversion.  I have made him n.p.o. after  midnight.      Arturo Morton. Riley Kill, MD, [facility]  Electronically Signed    TDS/MedQ  DD: 12/28/2008  DT: 12/29/2008  Job #: [id number]

## 2011-03-11 NOTE — Assessment & Plan Note (Signed)
[facility]                            CARDIOLOGY OFFICE NOTE   CALEL, PISARSKI                   MRN:          [id number]  DATE:12/28/2008                            DOB:          Oct 06, 1941    Mr. Preble is in for a follow-up visit.  To briefly summarize, this  morning he felt himself go out of rhythm.  He does not feel great.  However, he is stable.  We had him come in today to be evaluated.  He  has recently had atrial fibrillation ablation by Dr. Johney Frame.  He has  developed recurrent arrhythmias, and just a couple of weeks ago  underwent cardioversion with increasing his amiodarone.  He does have  some amiodarone side effects, and we therefore have been desirous of  getting him off, and this is what led to the atrial fibrillation.   CURRENT MEDICATIONS:  1. Coumadin.  2. Protonix 40 mg daily.  3. Amiodarone 200, two b.i.d.   INR today was 2.1.   PHYSICAL EXAMINATION:  Today he is alert and oriented.  Blood pressure is 106/78, the pulse is 99.  There is a systolic ejection  murmur compatible with his known hypertrophic cardiomyopathy.   The electrocardiogram clearly demonstrates what appears to be an  atypical flutter.   We will have him see Dr. Johney Frame tomorrow.  Perhaps he can be done early  with a cardioversion.  I have told him to call if he would in any way  feel poorly overnight.   ADDENDUM:  He did not sleep well the night before going into atrial  flutter.     Arturo Morton. Riley Kill, MD, [facility]  Electronically Signed    TDS/MedQ  DD: 12/29/2008  DT: 12/29/2008  Job #: [id number]

## 2011-03-11 NOTE — Assessment & Plan Note (Signed)
[facility]                            CARDIOLOGY OFFICE NOTE   TEEJAY, MEADER                   MRN:          [id number]  DATE:01/03/2008                            DOB:          1941-04-22    Mr. Gammel is in for a followup visit.  To briefly summarize, he has  not been having any ongoing chest pain or shortness of breath.  He did  work the [employer] this weekend, and really had no  difficulties with that.  He actually feels quite well.   PHYSICAL EXAMINATION:  Blood pressure is 150/90, when rechecked by me it  was 140/70, pulse is 90.  Lung fields are clear.  CARDIAC:  Rhythm is regular.  EXTREMITIES:  Reveal no edema.  There is a systolic ejection murmur compatible with hypertrophic  cardiomyopathy.   Electrocardiogram demonstrates normal sinus rhythm.  There is delay in R  wave progression but I am sure this is just lead position related to the  left axis orientation.  The QT interval is 460 milliseconds.   The patient is stable.  We will see him back in followup in 2 months.  He will continue the same current medical regimen.  We talked about a  number of issues today, particularly his work and his medication  regimen.     Arturo Morton. Riley Kill, MD, [facility]  Electronically Signed    TDS/MedQ  DD: 01/03/2008  DT: 01/03/2008  Job #: [id number]

## 2011-03-11 NOTE — Letter (Signed)
November 20, 2008    Arturo Morton. Riley Kill, MD, [facility]  [address]  [address]  [place], [place] [address]   RE:  JERALD, HENNINGTON  MRN:  [id number]  /  DOB:  11-03-1940   Dear Dr. Riley Kill,   It was my pleasure to see Wyatt Thorstenson in electrophysiology followup  today.  As you recall, he is a pleasant 70 year old gentleman with  symptomatic typical-appearing atrial flutter and atrial fibrillation.  He also has a history of hypertrophic cardiomyopathy.  He reports  developing symptomatic palpitations over the weekend.  He reports  significant fatigue being washed out and short of breath with minimal  activities.  He recently decreased his amiodarone to 200 mg daily.  He  remains therapeutic on Coumadin.  He denies chest pain, presyncope, or  syncope and is otherwise without complaint today.   PROBLEM LIST:  1. Persistent atrial fibrillation.  2. Typical appearing atrial flutter.  3. Hypertrophic cardiomyopathy.   CURRENT MEDICATIONS:  1. Amiodarone 200 mg daily.  2. Coumadin to maintain an INR between 2 and 3.   PHYSICAL EXAMINATION:  VITAL SIGNS:  Blood pressure is 100/60, heart  rate 100, respirations 18, and weight 216 pounds.  GENERAL:  The patient is an ill-appearing male in no acute distress.  He  is alert and oriented x3.  HEENT:  Normocephalic and atraumatic.  Sclerae clear.  Conjunctivae  pink.  Oropharynx clear.  NECK:  Supple.  No JVD, lymphadenopathy, or bruits.  LUNGS:  Clear to auscultation bilaterally.  HEART:  Tachycardic.  Irregular rhythm.  No murmurs, rubs, or gallops.  GI:  Soft, nontender, and nondistended.  Positive bowel sounds.  EXTREMITIES:  No clubbing, cyanosis, or edema.  NEUROLOGIC:  Nonfocal.  SKIN:  No ecchymosis or lacerations.   EKG repair reveals typical appearing atrial flutter with variable  conduction an average ventricular rate of 100 beats per minute and  inferior infarct pattern is noted with nonspecific ST/T-wave changes.   IMPRESSION:  Mr. Alfrey is a pleasant 70 year old gentleman who  presents today with recurrent typical-appearing atrial flutter and  atrial fibrillation.  He is rather symptomatic at this time.  Therapeutic strategies for typical appearing atrial flutter and  persistent atrial fibrillation were discussed in detail with the patient  today.  Risks, benefits, and alternatives to cardioversion and also to  EP study and radiofrequency ablation were discussed.  The patient  requested cardioversion today and we will then proceed with  radiofrequency ablation at the next available time.  No medication  changes were made today.   PLAN:  1. The patient was directed to the short-stay area at [facility] for immediate cardioversion.  2. No medication changes were made today.  3. EP study and radiofrequency ablation for atrial fibrillation and      typical-appearing atrial flutter will be scheduled for the next      available time.    Sincerely,      Hillis Range, MD  Electronically Signed    JA/MedQ  DD: 11/20/2008  DT: 11/21/2008  Job #: [id number]   CC:    Doylene Canning. Ladona Ridgel, MD

## 2011-03-11 NOTE — Op Note (Signed)
NAME:  PATRICH, HEINZE NO.:  [id number]   MEDICAL RECORD NO.:  [id number]          PATIENT TYPE:  OIB   LOCATION:  2899                         FACILITY:  [facility]   PHYSICIAN:  Bevelyn Buckles. Bensimhon, MDDATE OF BIRTH:  [birth year]   DATE OF PROCEDURE:  12/15/2008  DATE OF DISCHARGE:                               OPERATIVE REPORT   This is a cardioversion report.   INDICATION:  Mr. Mollica is a 70 year old male with hypertrophic  cardiomyopathy and recurrent atrial fibrillation.  He was seen by Dr.  Johney Frame and started on amiodarone and referred for cardioversion.  INR  prior to the procedure was 2.3.  He has been therapeutic for greater  than 4 weeks.   The risks and benefits were explained.  Consent was signed and placed on  the chart.  Sedation was provided by Dr. Michelle Piper of Anesthesiology with  175  mg of IV Patanol and 150 joules synchronized biphasic shock was  delivered with prompt conversion to normal sinus rhythm.  There are no  apparent complications.      Bevelyn Buckles. Bensimhon, MD  Electronically Signed     DRB/MEDQ  D:  12/15/2008  T:  12/15/2008  Job:  [id number]

## 2011-03-11 NOTE — Discharge Summary (Signed)
NAME:  JEANNE, TERRANCE NO.:  [id number]   MEDICAL RECORD NO.:  [id number]          PATIENT TYPE:  OBV   LOCATION:  2915                         FACILITY:  [facility]   PHYSICIAN:  Hillis Range, MD       DATE OF BIRTH:  06/18/1941   DATE OF ADMISSION:  05/08/2009  DATE OF DISCHARGE:  05/09/2009                               DISCHARGE SUMMARY   This patient has tremor with amiodarone therapy, but he is willing to  continue that as long as it will guarantee sinus rhythm.   TIME FOR THIS DICTATION:  Greater than 35 minutes.  This includes  examination and discussion with the patient.   FINAL DIAGNOSES:  1. Persistent atrial fibrillation   SECONDARY DIAGNOSES:  1. Echocardiogram December 06, 2008, ejection fraction of 55-60%      moderate mitral regurgitation.  2. Hypertension.  3. Hypertrophic cardiomyopathy.   PROCEDURE:  May 08, 2009, afib ablation was performed.   BRIEF HISTORY:  Mr. Tozzi is a 70 year old male.  He has a history of  atrial fibrillation and this is symptomatic.  He gets fatigue and  dyspnea.  He previously failed medical therapy with multiple medications  including coumadin.  The patient had a prior catheter ablation for  atrial fibrillation on December 07, 2008.  He was in sinus rhythm  approximately 72 hours before reverting to atrial fibrillation in the  early morning hours while in bed.  The patient is on chronic Coumadin  therapy and on diltiazem.  He now presents for repeat cather ablation.   HOSPITAL COURSE:  The patient presents electively on May 08, 2009.  He  underwent electrophysiology study with anatomic encirclement of the  right superior pulmonary vein, the other three pulmonary veins were  electrically silent from a prior ablation.  Marked left atrial  enlargement was observed with diffuse left atrial scarring.  CAFEs were  identified and ablation throughout the left atrium.  The patient  remained in sinus rhythm thereafter.   There has been no trouble from the  catheter insertion sites, no hematoma there, no oozing.  He had limited  transthoracic echocardiogram the morning of discharge showing no  pericardial effusion.  The patient will be restarted on amiodarone  therapy.   MEDICATIONS:  1. He is to take amiodarone 200 mg tablets 2 tablets in the morning, 2      tablets in the evening from Wednesday May 09, 2009, through      Wednesday May 23, 2009, and then start 1 tablet in the morning and      1 tablet in the evening, Thursday May 24, 2009.  2. Protonix a new medication 40 mg daily for 6 weeks.  3. Coumadin 1 mg daily.  4. He is to stop taking diltiazem.  5. He applies cortisone cream to the area of skin irritation.   FOLLOWUP:  He has followup with [facility], [address].  1. Coumadin Clinic, Wednesday May 16, 2009, at 12:30.  2. He sees Dr. Johney Frame, Wednesday August 08, 2009, at 9:30.   LABORATORY STUDIES THIS ADMISSION:  Sodium 141, potassium 4.4, chloride  105, carbonate 30, glucose 95, BUN is 25, creatinine 1.3, white cells  6.6, hemoglobin 14.2, hematocrit 41.6, platelets of 146.  On the day of  discharge, protime is 31.5, INR 2.8.      Maple Mirza, [place]      Hillis Range, MD  Electronically Signed    GM/MEDQ  D:  05/09/2009  T:  05/09/2009  Job:  [id number]   cc:   [facility]

## 2011-03-11 NOTE — Procedures (Signed)
NAME:  KHOEN, GENET NO.:  [id number]   MEDICAL RECORD NO.:  [id number]          PATIENT TYPE:  OUT   LOCATION:  SLEEP CENTER                 FACILITY:  [facility]   PHYSICIAN:  Clinton D. Maple Hudson, MD, FCCP, FACPDATE OF BIRTH:  09[birth year]   DATE OF STUDY:  12/16/2008                            NOCTURNAL POLYSOMNOGRAM   REFERRING PHYSICIAN:   REFERRING PHYSICIAN:  Dr. Hillis Range.   DATE OF THE STUDY:  November 29, 2008.   INDICATION FOR STUDY:  Hypersomnia with sleep apnea.   EPWORTH SLEEPINESS SCORE:  2/24.  BMI 30.1.  Weight 216 pounds.  Height  71 inches.  Neck 15 inches.   HOME MEDICATIONS:  Are charted and reviewed.   SLEEP ARCHITECTURE:  Total sleep time 246 minutes with sleep efficiency  60.5%.  Stage I was 8.3%.  Stage II 71.6%.  Stage III absent.  REM 20.1%  of total sleep time.  Sleep latency 47 minutes.  REM latency 187  minutes.  Awake after sleep onset 105 minutes.  Arousal index 13.9.  No  bedtime medication was taken.  The patient awake and intermittently  throughout the night before final waking at 5:00 a.m.   RESPIRATORY DATA:  Apnea/hypopnea index (AHI) 4.9 per hour.  A total of  20 events was scored including 1 obstructive apnea and 19 hypopneas.  All events were recorded as nonsupine.  REM AHI 14.5 per hour.   OXYGEN DATA:  Moderately loud snoring with oxygen desaturation to a  nadir of 34% which is probably artifact.  Mean oxygen saturation through  the study was 90.7% on room air.  A total of 8.1 minutes was recorded  with oxygen saturation less than 88% on room air.   CARDIAC DATA:  Sinus rhythm.   MOVEMENTS/PARASOMNIA:  No significant movement disturbance.  No bathroom  trips.   IMPRESSION/RECOMMENDATIONS:  1. Occasional obstructive respiratory event with sleep disturbance,      within normal limits, apnea-hypopnea index 4.9 per hour (normal      range 2-5 per hour).  Events were not positional.  Moderately loud      snoring with  mean oxygen saturation through the study of 90.7% on      room air.  Significant central sleep apnea was not seen.  2. Mean oxygen saturation was low at 90.7% with a total of 8.1 minutes      recording saturation less than 88% on      room air.  This patient may be a candidate for supplemental oxygen      during sleep.  3. Frequent nonspecific waking through the night with poor sleep      efficiency.  Consider management as insomnia.      Clinton D. Maple Hudson, MD, [facility], FACP  Diplomate, Biomedical engineer of Sleep Medicine  Electronically Signed     CDY/MEDQ  D:  12/16/2008 10:00:31  T:  12/17/2008 02:16:18  Job:  [id number]

## 2011-03-11 NOTE — Assessment & Plan Note (Signed)
[facility]                                 ON-CALL NOTE   AVEREY, TROMPETER                   MRN:          [id number]  DATE:11/18/2008                            DOB:          Jun 18, 1941    CARDIOLOGIST:  Arturo Morton. Riley Kill, MD, [facility] and Hillis Range, MD   PHONE NUMBER:  [phone].   HISTORY:  Mr. Aloisi is a 70 year old male followed by Dr. Riley Kill and  Dr. Johney Frame with a history of hypertrophic obstructive cardiomyopathy as  well as paroxysmal atrial fibrillation as well as a history of atrial  flutter.  He is on amiodarone 200 mg a day and Coumadin.  His Coumadin  was last checked in late December 2009 and his INR was 2.8.  The patient  has had several admissions with recurrent atrial fibrillation and has  undergone cardioversion in the past.  He has recently discussed with Dr.  Johney Frame the possibility of proceeding with atrial fibrillation ablation.  He is still pondering this.  He feels like he went back out of rhythm at  11:30 this morning.  He called the Answering Service tonight with some  concerns.  His heart rate has been recorded at 116.  His blood pressure  is stable at 139/92.  He feels tired, but denies chest discomfort,  shortness of breath, nausea, or diaphoresis.  He did note some dyspepsia  symptoms but these have resolved.   PLAN:  I explained to Mr. Lapinsky that he can go to the emergency room  if he feels bad enough.  Certainly, if his heart rate climbs into the  140s or higher, he should go to the emergency room.  He inquired about  taking an extra amiodarone and I explained to him that this would be a  reasonable thing to do tonight.  I instructed him that I will leave a  message in our office for him to be seen as an outpatient on Monday  morning.  If his symptoms should change before that time, as noted he  should go to the emergency room.   DISPOSITION:  Mr. Westman will try the extra amiodarone and see how he  feels tonight.  If he continues to feel well, he will go to the office  on Monday, otherwise he will go to the emergency room this weekend.      Tereso Newcomer, PA-C  Electronically Signed      Luis Abed, MD, [facility]  Electronically Signed   SW/MedQ  DD: 11/18/2008  DT: 11/19/2008  Job #: [id number]

## 2011-03-11 NOTE — Op Note (Signed)
NAME:  Bradley Winters, Bradley Winters NO.:  [id number]   MEDICAL RECORD NO.:  [id number]          PATIENT TYPE:  OIB   LOCATION:  2899                         FACILITY:  [facility]   PHYSICIAN:  Madolyn Frieze. Jens Som, MD, FACCDATE OF BIRTH:  1940-11-20   DATE OF PROCEDURE:  02/16/2009  DATE OF DISCHARGE:  02/16/2009                               OPERATIVE REPORT   This is cardioversion of atrial fibrillation.  The patient was sedated  with propofol 150 mg intravenously by Anesthesia.  Synchronized  cardioversion with 120 joules (biphasic) resulted in normal sinus  rhythm.  There were no immediate complications.  We would recommend  continuing Coumadin and follow up with Dr. Riley Kill as scheduled.      Madolyn Frieze Jens Som, MD, [facility]  Electronically Signed     BSC/MEDQ  D:  02/16/2009  T:  02/17/2009  Job:  [id number]

## 2011-03-11 NOTE — Consult Note (Signed)
NAME:  Bradley Winters, Bradley Winters NO.:  [id number]   MEDICAL RECORD NO.:  [id number]          PATIENT TYPE:  INP   LOCATION:  4707                         FACILITY:  [facility]   PHYSICIAN:  Doylene Canning. Ladona Ridgel, MD    DATE OF BIRTH:  September 08, 1941   DATE OF CONSULTATION:  11/24/2007  DATE OF DISCHARGE:                                 CONSULTATION   REASON FOR CONSULTATION:  Consultation was requested by Dr. Bonnee Quin  and Dr. Calton Dach regarding atrial fibrillation in the setting of  hypertrophic cardiomyopathy.   HISTORY OF PRESENT ILLNESS:  The patient is a very pleasant 70 year old  man with a history of paroxysmal atrial fibrillation for over 15 years.  He has had multiple cardioversions in the past.  He has been for the  most part free of atrial fibrillation over the last 10 years.  He was  noted to have atrial fibrillation back in November after some strenuous  yard work.  He denies syncope.  He does note shortness of breath which  is mild as well as fatigue and anxiety and the sensation of palpitations  when he is out of rhythm.  The patient was seen in the emergency  department and admitted with atrial fibrillation and a minimally  increased rapid ventricular response.  Is referred now for additional  consideration and evaluation.  Of note the patient makes mention that he  had an episode of atrial fibrillation which began after an albuterol  treatment was given during pulmonary function testing.  This by the way  demonstrated normal DLCO.  He has never had syncope.  Additional past  medical history is notable for longstanding hypertrophic cardiomyopathy  and chronic Coumadin therapy.  He does not have hypertension on medical  therapy.   MEDICATIONS:  1. Amiodarone 200 a day.  2. Cardizem 30 q.6.  3. Coumadin as per pharmacy.   SOCIAL HISTORY:  The patient is married.  He lives with his wife.  He is  retired.  He has a history of tobacco use, but has not smoked for  many  years and has a history of minimal alcohol use.  He is retired from  working for a [employer].   FAMILY HISTORY:  Notable for colon cancer on his mother's side and heart  problems on his father's side.   REVIEW OF SYSTEMS:  Is as noted in the HPI.  Also the patient has  nocturia and arthralgias and joint discomfort at times.  Otherwise, his  Review of Systems was normal.   PHYSICAL EXAMINATION:  GENERAL:  He is a pleasant well-appearing middle-  aged man in no acute distress.  VITAL SIGNS:  Blood pressure was 100/70, pulse was 84 and irregularly  irregular, respirations 18, temperature 98.6, oxygen saturation 96%.  HEENT:  Normocephalic, atraumatic.  Pupils equal and round.  Oropharynx  moist.  Sclerae anicteric.  NECK:  Revealed no jugular venous distention.  There is no thyromegaly.  Trachea is midline.  Carotids 2+ and symmetric.  LUNGS:  Clear bilaterally to auscultation.  No wheezes, rales or rhonchi  are present.  There is  no increased work of breathing.  CARDIOVASCULAR:  Revealed an irregular rhythm with normal S1 and S2.  There is a grade 2/6 systolic murmur at the left upper sternal border.  PMI was not enlarged nor laterally displaced.  ABDOMEN:  Soft, nontender, nondistended.  There is no organomegaly.  Bowel sounds are present.  No rebound or guarding is present.  EXTREMITIES:  Demonstrated no cyanosis, clubbing or edema.  Pulses were  2+ and symmetric.  NEUROLOGIC:  Alert and oriented x3 with cranial nerves intact.  Strength  is 5/5 and symmetric.   EKG demonstrates atrial fibrillation with a rapid ventricular response.   IMPRESSION:  1. Hypertrophic cardiomyopathy.  2. 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  3. Paroxysmal atrial fibrillation.  4. Chronic Coumadin therapy now which is subtherapeutic.   DISCUSSION:  I have recommended up titration of the patient's amiodarone  to 400 a day and T guided DC cardioversion.  Will get this  scheduled at  earliest possible convenient time.      Doylene Canning. Ladona Ridgel, MD  Electronically Signed     GWT/MEDQ  D:  11/24/2007  T:  11/25/2007  Job:  [id number]   cc:   Arturo Morton. Riley Kill, MD, [facility]  Veverly Fells. Excell Seltzer, MD

## 2011-03-11 NOTE — Assessment & Plan Note (Signed)
[facility]                         ELECTROPHYSIOLOGY OFFICE NOTE   Bradley Winters                   MRN:          [id number]  DATE:08/02/2008                            DOB:          August 11, 1941    REFERRING PHYSICIAN:  Arturo Morton. Riley Winters, Bradley Winters, [facility]   INTRODUCTION:  Bradley Winters is a very pleasant 70 year old gentleman  with hypertrophic obstructive cardiomyopathy and paroxysmal atrial  fibrillation, who presents today for further EP evaluation.   HISTORY OF PRESENT ILLNESS:  Bradley Winters reports developing symptomatic  atrial fibrillation approximately 10 years ago.  He was subsequently  initiated on amiodarone and Coumadin.  Despite his medical therapy, he  reports increasing frequency and duration of atrial fibrillation since  that time.  Presently, he reports having symptomatic atrial fibrillation  every 2-3 months.  These episodes are associated with decreased exercise  capacity and fatigue.  He also reports occasional palpitations.  During  atrial fibrillation, the patient severely limits his activities due to  concerns for sudden death.  He has required multiple previous  cardioversions.  When in sinus rhythm, he reports feeling rather well.  He typically is very active and can walk approximately 1 mile without  difficulty.  He denies chest discomfort, shortness of breath, orthopnea,  PND, palpitations, near syncope, or syncope when in sinus rhythm.  He  has been reluctant to take antiarrhythmic medications other than  amiodarone including dofetilide due to concerns for sudden death.  He  was recently evaluated by Bradley Winters and is now referred for  possible catheter ablation.   PAST MEDICAL HISTORY:  1. Hypertrophic obstructive cardiomyopathy.  2. He has systolic anterior motion of the mitral valve as well as a      nontrivial gradient across the aortic outflow tract.  3. Paroxysmal atrial fibrillation.   MEDICATIONS:  1.  Amiodarone 200 mg daily.  2. Coumadin to maintain an INR between 2 and 3.   ALLERGIES:  No known drug allergies.   FAMILY HISTORY:  The patient's father and brother died of sudden death,  so the patient is unaware of any family history of hypertrophic  cardiomyopathy.   SOCIAL HISTORY:  The patient lives in [place].  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  He smoked previously, but  quit approximately 8 years ago.  He denies alcohol use.   REVIEW OF SYSTEMS:  All systems are reviewed and negative except as  outlined in the HPI above.   PHYSICAL EXAMINATION:  VITALS:  Blood pressure 112/64, heart rate 55,  respirations 18, and weight 216 pounds.  GENERAL:  The patient is a well-appearing male in no acute distress.  He  is alert and oriented x3.  HEENT:  Normocephalic and atraumatic.  Sclerae clear.  Conjunctivae  pink.  Oropharynx clear.  NECK:  Supple.  No JVD, lymphadenopathy, or bruits.  LUNGS:  Clear to auscultation bilaterally.  HEART:  Regular rate and rhythm, 2/6 systolic ejection murmur along the  left sternal border is noted as well as a 2/6 systolic ejection murmur  at the apex.  GI:  Soft, nontender, and nondistended.  Positive bowel sounds.  EXTREMITIES:  No clubbing, cyanosis, or edema.  NEUROLOGIC:  Nonfocal.  SKIN:  No ecchymosis or lacerations.  MUSCULOSKELETAL:  No deformity or atrophy.   EKG sinus rhythm at 55 beats per minute, PR intervals 212 msec, and  anteroseptal infarct pattern is observed.   IMPRESSION:  Bradley Winters is a pleasant 70 year old gentleman with  hypertrophic cardiomyopathy and paroxysmal atrial fibrillation, who  presents today for further EP consultation.  Therapeutic strategies for  atrial fibrillation including both medicine and catheter-based therapies  were discussed in detail with the patient today.  He has failed medical  therapy with amiodarone, as he continues to have frequent episodes of   symptomatic atrial fibrillation.  The risks, benefits, and alternatives  to EP study and radiofrequency ablation for atrial fibrillation were  also discussed in detail with the patient today.  These risks include,  but are not limited to a stroke, bleeding, vascular damage, pericardial  effusion with tamponade, pulmonary vein stenosis, esophageal and lung  injury, heart attack, and death.  The patient understands these risks  and wishes to proceed with catheter ablation.   PLAN:  We will therefore schedule the patient for EP study and  radiofrequency ablation for atrial fibrillation in the near future.  The  patient is active in gun competitions.  He reports that his competitive  season will be over at the first of the year.  He therefore wishes to  defer catheter ablation for atrial fibrillation until the first of the  year.  The importance of chronic anticoagulation with Coumadin was again  stressed today.  I have asked the patient to return to see me in the  clinic in 3 months.     Bradley Winters, Bradley Winters  Electronically Signed   Bradley Winters  DD: 08/02/2008  DT: 08/03/2008  Job #: [id number]   cc:   Bradley Winters, Bradley Winters, Bradley Winters  Bradley Winters, Bradley Winters, Bradley Winters  Bradley Winters, Bradley Winters

## 2011-03-11 NOTE — Assessment & Plan Note (Signed)
[facility]                            CARDIOLOGY OFFICE NOTE   AQUARIUS, LATOUCHE                MRN:          [id number]  DATE:09/21/2007                            DOB:          1940/12/04    [name] is in a followup visit.  He recently underwent a  cardioversion in the hospital.  He was brought in on the 21st, and  subsequently underwent treatment with Diltiazem.  He had 120 joules and  resumed ventricular response fairly promptly.  He is back in for  followup and feeling well.  He denies any ongoing chest pain or  shortness of breath.   MEDICATIONS:  1. Amiodarone 200 mg daily.  2. Coumadin as directed.   PHYSICAL EXAMINATION:  VITAL SIGNS:  Weight is 215 pounds, blood  pressure 128/70, pulse was 61.  HEART:  Systolic ejection murmur that is associated with decrease with  squatting and increase with standing.   IMPRESSION:  1. Hypertrophic cardiomyopathy.  2. Paroxysmal atrial fibrillation on Coumadin anticoagulation.   PLAN:  1. Continue Amiodarone.  2. Follow up in the clinic in three to four weeks.     Arturo Morton. Riley Kill, MD, [facility]  Electronically Signed    TDS/MedQ  DD: 09/21/2007  DT: 09/21/2007  Job #: [id number]

## 2011-03-11 NOTE — Assessment & Plan Note (Signed)
[facility]                            CARDIOLOGY OFFICE NOTE   EIVAN, GALLINA ERIC                MRN:          [id number]  DATE:08/17/2007                            DOB:          1940/12/01    CHIEF COMPLAINT:  Palpitations.   HISTORY OF PRESENT ILLNESS:  Mr. Villamar is a delightful 70 year old  gentleman who has been a long time patient of Dr. Riley Kill.  He has  hypertrophic cardiomyopathy and paroxysmal atrial fibrillation.  He has  been maintained in sinus rhythm on amiodarone and has not had an episode  of atrial fibrillation in several years.  While he was working on his  car this morning, he suddenly did not feel well.  He described an  uncomfortable feeling in his chest, and has a perception of his heart  beating fast.  He noted that his heart rate was elevated and was asked  to come to the office for evaluation.  Mr. Fan has had an initial  EKG done and it has demonstrated atrial fibrillation with rapid  ventricular response.  He complains today of feeling fatigued.  He  denies dyspnea, orthopnea, PND or chest pain.  He has not had  lightheadedness or syncope.  He has been a little fatigued for the last  48 hours, but really did not feel the sensation of palpitations until  this morning.  He has no other complaints at today's evaluation.  He  denies any recent illness, and he denies fevers, chills, nausea,  vomiting, or other complaints.   CURRENT MEDICATIONS:  1. Coumadin as directed.  2. Amiodarone 200 mg daily.   PAST MEDICAL HISTORY:  1. Hypertrophic cardiomyopathy.  2. Paroxysmal atrial fibrillation.   No other significant medical problems have been noted.   ALLERGIES:  No known drug allergies.   SOCIAL HISTORY:  The patient is married, he has 3 children.  He is a  former smoker.  He does not drink alcohol.   FAMILY HISTORY:  His father died of a myocardial infarction at age 23.   REVIEW OF SYSTEMS:  Complete  12-point review of systems was performed  and are negative except as detailed above.   PHYSICAL EXAMINATION:  The patient is alert and oriented.  He is in no  acute distress.  Heart rate is 120, weight is 213 pounds, blood pressure is 124/64.  Respiratory rate is 20.  HEENT:  Normal.  NECK:  Normal carotid upstrokes without bruits.  Jugular venous pressure  is normal.  No thyromegaly or thyroid nodules.  LUNGS:  Clear to auscultation bilaterally.  HEART:  Irregularly irregular with 2/6 mid systolic murmur along the  left sternal border and apex.  No diastolic murmurs or gallops are  noted.  ABDOMEN:  Soft, nontender, no organomegaly.  BACK:  There is no CVA tenderness.  EXTREMITIES:  There is 1+ pretibial edema on the right and trace  pretibial edema on the left.  There are stasis changes in the legs.  Peripheral pulses are 2+ and equal throughout.  SKIN:  Warm and dry.  NEUROLOGIC:  Cranial nerves II-XII are intact.  Strength is intact  and  equal bilaterally.   EKG shows atrial fibrillation with rapid ventricular response and a  ventricular rate of 121 beats per minute.  There is left axis deviation  and incomplete left bundle branch block noted.   ASSESSMENT:  1. Atrial fibrillation with rapid ventricular response.  2. Chronic anticoagulation with Coumadin.  3. Hypertrophic cardiomyopathy.   I think Mr. Luberto should be hospitalized.  In the setting of his  hypertrophic obstructive cardiomyopathy, I am not comfortable trying to  manage his atrial fibrillation with rapid ventricular response as an  outpatient.  I am going to admit him to the hospital and place him on a  Diltiazem drip to try to slow his rate.  If he does not convert  overnight, will likely proceed with cardioversion tomorrow morning.  He  has been therapeutic on Coumadin, and we will recheck and INR tonight  when he arrives at the hospital.  As above, will start him on a  Diltiazem drip and continue his  home medications.  He will be admitted  to a telemetry bed.   Will check a baseline chest x-ray as well as baseline laboratory work.  His thyroid and liver function tests have been monitored closely as he  has been on long-term amiodarone, and they have been within the normal  range.     Veverly Fells. Excell Seltzer, MD  Electronically Signed    MDC/MedQ  DD: 08/17/2007  DT: 08/18/2007  Job #: [id number]

## 2011-03-11 NOTE — Op Note (Signed)
NAME:  Bradley Winters, Bradley Winters NO.:  [id number]   MEDICAL RECORD NO.:  [id number]          PATIENT TYPE:  INP   LOCATION:  2918                         FACILITY:  [facility]   PHYSICIAN:  Hillis Range, MD       DATE OF BIRTH:  02-20-1941   DATE OF PROCEDURE:  12/07/2008  DATE OF DISCHARGE:                               OPERATIVE REPORT   INTRODUCTION:  [name] is a pleasant 70 year old gentleman with a  history of hypertrophic cardiomyopathy and persistent atrial  fibrillation, who presents today for EP study and radiofrequency  ablation.  He also has a history of typical-appearing atrial flutter by  EKG.  He has developed symptoms of progressive fatigue and decreased  exercise capacity during atrial fibrillation despite medical therapy  with amiodarone.  He has required multiple recent cardioversions.  He  therefore presents today for EP study and radiofrequency ablation.   PREPROCEDURE DIAGNOSES:  1. Persistent atrial fibrillation.  2. Typical-appearing atrial flutter.   POSTPROCEDURE DIAGNOSES:  1. Persistent atrial fibrillation.  2. Typical-appearing atrial flutter.   PROCEDURES:  1. Comprehensive electrophysiologic study.  2. Coronary sinus pacing and recording.  3. Three-dimensional mapping of supraventricular tachycardia.  4. Radiofrequency ablation of supraventricular tachycardia.  5. Transseptal catheterization.  6. Intracardiac echocardiography.  7. Pulmonary vein angiography.  8. Arterial blood pressure monitoring.  9. External cardioversion.   SURGEON:  Hillis Range, MD   FIRST ASSISTANT:  Doylene Canning. Ladona Ridgel, MD   DESCRIPTION OF PROCEDURE:  Informed written consent was obtained, and  the patient brought to the electrophysiology lab in a fasting state.  He  was adequately sedated with intravenous medications as outlined in the  anesthesia report.  The patient's right and left groins were prepped and  draped in the usual sterile fashion by the EP lab  staff.  Using a  percutaneous Seldinger technique, one 6, one 7, and one 8-French  hemostasis sheaths were placed in the right common femoral vein.  An 9-  [place] hemostasis sheath was placed in the left common femoral vein.  A  4-French hemostasis sheath was placed in the right common femoral artery  for blood pressure monitoring.  A 6-French decapolar Polaris X catheter  was introduced through the right common femoral vein and advanced into  the coronary sinus for recording and pacing from this location.  A 6-  French quadripolar Josephson catheter was introduced through the right  common femoral vein and advanced into the His bundle for recording and  pacing.  A 10-French Biosense-Webster Sound Star intracardiac  echocardiography catheter was introduced through the left common femoral  vein and advanced to the right atrium.  A three-dimensional anatomical  reconstruction of the left atrium was performed using CartoSound  technology.  The patient was confirmed to have a very large left atrium.  The right superior and right inferior pulmonary veins were moderate in  size.  The left superior pulmonary vein appeared to be very small and  the left inferior pulmonary vein was quite large.  These veins did not  share a common ostium, but were very closely appositioned.  The  patient  presented to the electrophysiology lab in atrial fibrillation.  His HV  interval measured 42 msec.  The RR interval upon presentation measured  609 msec with a QRS duration of 112 msec and a QT duration of 351 msec.  The middle right common femoral vein sheath was exchanged for an 8.5-  [place] SL-2 transseptal sheath and transseptal access was achieved in a  standard fashion using a Brockenbrough needle under biplane fluoroscopy  with intracardiac echocardiography guidance.  Once transseptal access  had been achieved, heparin was administered intravenously and intra-  arterially in order to maintain an ACT of  greater than 400 sec  throughout the procedure.  A 6-French multipurpose angiographic catheter  with guidewire was introduced through the transseptal sheath and  positioned over the mouth of all 4 pulmonary veins.  Pulmonary venograms  were performed by hand injection of nonionic contrast and demonstrated  moderate-sized right superior and right inferior pulmonary veins with no  evidence of pulmonary vein stenosis.  The left superior pulmonary vein  again was noted to be quite small and the left inferior pulmonary vein  was noted to be very large.  They did not share a common ostium, but  were very closely appositioned to each other.  There was no evidence of  pulmonary vein stenosis within the left pulmonary veins.  The  angiographic catheter was then removed.  The His bundle catheter was  removed and in its place a 3.5-mm Biosense-Webster EZ Steer Thermocool  ablation catheter was advanced into the right atrium.  The transseptal  sheath was pulled back into the IVC over a guidewire.  The ablation  catheter was advanced across the transseptal hole using the wire as a  guide.  The transseptal sheath was then re-advanced over the guidewire  into the left atrium.  A 20-pole 20-mm circular mapping catheter was  introduced through the transseptal sheath and positioned over the mouth  of all 4 pulmonary veins.  The patient was confirmed to have electrical  conduction within all 4 pulmonary veins at baseline.  He underwent  successful sequential electrical isolation and anatomical encircling of  all 4 pulmonary veins using radiofrequency current with a circular  mapping catheter as a guide.  The left superior and left inferior  pulmonary veins were isolated together using a wide area circumferential  ablation technique due to their close approximation.  Complex  fractionated atrial electrograms were then identified and successfully  ablated along the roof of the left atrium, the interatrial  septum, and  along the lateral left atrium between the left inferior pulmonary vein  and the mitral valve annulus.  Additional complex fractionated atrial  electrograms were identified and ablated overlying the coronary sinus.  The patient was noted to have a relative paucity of electrograms,  however, despite his very large left atrium.  The patient's arrhythmia  organized into a typical-appearing atrial flutter with a cycle length of  230 msec.  The ablation catheter was pulled back into the right atrium  and a three-dimensional activation map during tachycardia was performed,  which demonstrated that the organized rhythm was not typical flutter.  The cycle length varied and the activation sequence appeared to change  during the rhythm.  The patient was therefore successfully cardioverted  to sinus rhythm with a single synchronized 200 J biphasic shock with  cardioversion electrodes in the anterior-posterior thoracic  configuration.  The circular mapping catheter was withdrawn from the  left atrium and positioned within the superior  vena cava.  This  documented a moderate amount of electrical conduction within the  superior vena cava.  A series of radiofrequency applications were  delivered within a circular fashion around the superior vena cava at the  junction of the right atrium with the SVC.  Pacing was performed prior  to each ablation lesion to confirm that diaphragmatic stimulation was  not present.  Each lesion was delivered with a 12-second duration with a  power of 35 W and a target temperature of 40 degrees.  I thereafter  elected to ablate the cavotricuspid isthmus due to a history of typical  right atrial flutter.  A series of radiofrequency applications were  delivered between the tricuspid valve annulus and the inferior vena cava  along the usual cavotricuspid isthmus.  The patient converted to an  atrial flutter during ablation, which had an initial appearance of   typical flutter with a strongly positive P-wave in V1 and a minimally  negative flutter wave in the inferior leads with a cycle length of 250  msec.  Again, the cycle length was noted to alter and the activation  sequence was found to vary from time to time.  Because of alteration in  cycle length and variable activation sequence, this tachycardia could  not be electroanatomically mapped.  The patient was therefore  successfully cardioverted to sinus rhythm with a single synchronized 200  J biphasic shock.  Following ablation differential pacing was performed  from the low lateral right atrium and complete bidirectional isthmus  block was confirmed as evident by a stimulus to earliest atrial  activation recorded across the lesion measuring 230 msec when pacing  from each side of the isthmus lesion.  Following ablation, the patient  remained in sinus rhythm with an RR interval of 1258 msec, QRS duration  of 98 msec, and a HV interval of 56 msec.  The procedure was therefore  considered completed.  An intracardiac echocardiogram was again  performed, which demonstrated no pericardial effusion.  All catheters  were removed, and the sheaths were aspirated and flushed.  Intravenous  protamine 40 mg was administered.  The patient was transferred to the  recovery area for sheath removal per protocol.  A limited bedside  transthoracic echocardiogram revealed no pericardial effusion.  There  were no early apparent complications.   CONCLUSIONS:  1. Atrial fibrillation upon presentation.  2. Successful electrical isolation and anatomical encircling of all 4      pulmonary veins using radiofrequency current.  The left superior      and left inferior pulmonary veins were ablated using a WACA      approach.  Complex fractionated atrial electrograms were identified      and successfully ablated along the roof of the left atrium,      interatrial septum, low lateral left atrium, and above the coronary       sinus.  3. A series of radiofrequency applications were delivered in a      circular fashion at the superior vena cava/right atrial junction.  4. Successful cardioversion to sinus rhythm.  5. Successful cavotricuspid isthmus ablation with complete      bidirectional isthmus block achieved.  6. No early apparent complications.      Hillis Range, MD  Electronically Signed     JA/MEDQ  D:  12/07/2008  T:  12/08/2008  Job:  [id number]   cc:   Doylene Canning. Ladona Ridgel, MD  Arturo Morton. Riley Kill, MD, [facility]

## 2011-03-11 NOTE — Assessment & Plan Note (Signed)
[facility]                         ELECTROPHYSIOLOGY OFFICE NOTE   Bradley Winters, Bradley Winters                   MRN:          [id number]  DATE:07/05/2008                            DOB:          08-14-1941    REFERRING PHYSICIAN:  Arturo Morton. Riley Kill, MD, [facility]   Bradley Winters is seen at the request of Dr. Riley Winters because of recurrent  atrial fibrillation/flutter in the setting of hypertrophic obstructive  cardiomyopathy.   He is having recurrent atrial fibrillation, most recently cardioverted  in April.   He awakened with this a couple of days ago.  He is quite aware of it  both in palpitations in terms of concomitant symptoms of shortness of  breath and chest pain.   These are mild.   This does, however, make him feel quite uncomfortable and anxious,  notwithstanding.   There was some discussion at his last hospitalization about Tikosyn,  this was not elected.   As noted, he has hypertrophic cardiomyopathy, ejection fraction is  normal.  There is systolic anterior motion of the mitral valve and, as I  recall Bradley Winters comments, there was a non-trivial gradient across  the aortic valve although I do not see it on the most recent echo  report.   MEDICATIONS:  Coumadin and amiodarone.  He is not taking anything else  for his problem.  He has no known drug allergies.   EXAMINATION:  His blood pressure was 110/70, his pulse was 96 and  irregular.  LUNGS:  Clear.  HEART SOUNDS:  Irregular.  NECK VEINS:  Flat.  ABDOMEN:  Soft.  EXTREMITIES:  Had trace edema, right greater than left.   Electrocardiogram dated today demonstrated a flutteroid-like rhythm.  The electrocardiogram from September 5th was clearly atrial  fibrillation.   IMPRESSION:  1. Paroxysmal atrial fibrillation - recurrent.  2. Amiodarone for the above.  3. Reluctance to undertake alternative antiarrhythmic drug therapy.  4. Hypertrophic obstructive cardiomyopathy with a  left ventricular      outflow tract gradient that in August 2005 was recorded 4 meters      per second.   DISCUSSION:  Bradley Winters has recurrent symptomatic atrial fibrillation.  In the short-term, cardioversion makes the most sense.  In the  intermediate term, we have discussed having a sit-down with Bradley Winters to discuss potentially catheter ablation in this somewhat more  difficult sub-group of patients with hypertrophic cardiomyopathy.  He is  to meet with Dr. Riley Winters a couple weeks after that to decide whether to  pursue catheter ablation and if so where.   We will plan to set these 3 appointments up.     Bradley Salvia, MD, [facility]  Electronically Signed    SCK/MedQ  DD: 07/05/2008  DT: 07/05/2008  Job #: [id number]

## 2011-03-11 NOTE — Assessment & Plan Note (Signed)
[facility]                            CARDIOLOGY OFFICE NOTE   Bradley Winters, Bradley Winters                   MRN:          [id number]  DATE:12/19/2008                            DOB:          1941-07-12    [name] is in for followup.  In general, he has been stable.  He  has had a long ordeal.  He underwent a radiofrequency ablation of his  atrial fib.  He then developed flutter.  He was placed on amiodarone and  failed to convert and underwent cardioversion.  He is now stabilized.  He is feeling better over the last 3-4 days.  He is now on amiodarone  200 mg two tablets twice daily or 800 mg a day.  He is also taking  Coumadin, and Protonix 40 mg daily.   On physical, the blood pressure is 132/78, pulse is 59.  The lung fields  are actually clear.  He has a systolic ejection murmur compatible with  his known hypertrophic cardiomyopathy.   In summary, the patient has hypertrophic obstructive cardiomyopathy with  paroxysmal atrial fibrillation, now status post ablation.  He now has  typical-appearing flutter that was cardioverted.  He is remaining on  fairly high-dose amiodarone, with followup with Dr. Johney Frame planned.  He  would be seen around January 25, 2009.  If he has any problems in the  interim he is to call us.      Arturo Morton. Riley Kill, MD, [facility]  Electronically Signed    TDS/MedQ  DD: 12/19/2008  DT: 12/20/2008  Job #: [id number]

## 2011-03-11 NOTE — Cardiovascular Report (Signed)
NAME:  Bradley, Winters NO.:  [id number]   MEDICAL RECORD NO.:  [id number]          PATIENT TYPE:  INP   LOCATION:  2915                         FACILITY:  [facility]   PHYSICIAN:  Hillis Range, MD       DATE OF BIRTH:  [birth year]   DATE OF PROCEDURE:  05/08/2009  DATE OF DISCHARGE:                            CARDIAC CATHETERIZATION   PREPROCEDURE DIAGNOSIS:  Persistent atrial fibrillation.   POSTPROCEDURE DIAGNOSIS:  Persistent atrial fibrillation.   PROCEDURES:  1. Comprehensive electrophysiology study.  2. Coronary sinus pacing recording.  3. A 3-D mapping of supraventricular tachycardia.  4. Ablation of supraventricular tachycardia.  5. Arterial blood pressure monitoring.  6. Intracardiac echocardiography.  7. Pulmonary vein venography.  8. Transseptal puncture of an intact septum.  9. Cardioversion.   INTRODUCTION:  Bradley Winters is a pleasant 70 year old gentleman with a  history of hypertrophic cardiomyopathy and symptomatic persistent atrial  fibrillation who presents today for EP study and radiofrequency ablation  of his atrial fibrillation.  He previously failed medical therapy with  multiple antiarrhythmic medications and underwent atrial fibrillation  ablation by me on December 07, 2008.  He did well initially following  ablation but developed recurrent symptomatic atrial fibrillation despite  medical therapy with amiodarone.  He has subsequently been treated with  a rate control strategy with diltiazem and chronically anticoagulated  with Coumadin.  He now presents for repeat catheter ablation for his  atrial fibrillation.   DESCRIPTION OF PROCEDURE:  Informed written consent was obtained and the  patient was brought to the Electrophysiology Lab in the fasting state.  He was adequately sedated with intravenous medications as outlined in  the anesthesia report.  The patient's right and left groins were prepped  and draped in the usual sterile fashion  by the EP lab staff.  Using a  percutaneous Seldinger technique, one 6, one 7, and one 8 [place]  hemostasis sheaths were placed in the right common femoral vein.  A 4-  [place] hemostasis sheath was placed in the right common femoral artery  for blood pressure monitoring.  An 11-French hemostasis sheath was  placed in the left common femoral vein.  A 6-French decapolar Polaris X  catheter was introduced through the right common femoral vein and  advanced into the coronary sinus for recording and pacing from this  location.  A 6-French quadripolar Josephson catheter was introduced  through the right common femoral vein and advanced into the right  ventricle for recording and pacing.  This catheter was then pulled back  to the His bundle location.  The patient presented to the  Electrophysiology Lab in atrial fibrillation with a controlled  ventricular rate.  The surface electrogram was suggestive of an  organized rhythm perhaps atypical atrial flutter.  The coronary sinus  atrial activation, however, was consistent with atrial fibrillation.  The patient's HV interval measured 60 milliseconds with a QRS duration  of 100 milliseconds and a QT interval of 383 milliseconds.  A 10-French  Biosense Webster SoundStar intracardiac echocardiography catheter was  introduced through the left common femoral vein and advanced into the  right  atrium.  Intracardiac echocardiography of the left atrial  structures was performed.  This demonstrated a markedly enlarged left  atrium.  The left superior and inferior pulmonary veins were noted to be  close in their apposition of each other.  While the left inferior  pulmonary vein appeared to be moderate in size, the left superior vein  was noted to be very small.  The right superior and right inferior  pulmonary veins were noted to be moderate in size.  Flow velocities were  checked within all 4 pulmonary veins, an average 30-50.  There was no  evidence of  pulmonary vein stenosis.  The middle right common femoral  vein sheath was then exchanged for an 8.5 [place] SL2 transseptal sheath  and transseptal access was achieved in a standard fashion using a  Brockenbrough needle under biplane fluoroscopy with intracardiac  echocardiography visualization of the transseptal puncture.  Once  transseptal access had been achieved, heparin was administered  intravenously and intra-arterially in order to maintain an ACT of  greater than 400 seconds throughout the procedure.  A 6-French  multipurpose angiographic catheter with guidewire was introduced through  the transseptal sheath and positioned over the mouth of all 4 pulmonary  veins.  Pulmonary venograms were performed by hand injection of nonionic  contrast.  This demonstrated a closely appositioned left superior and  left inferior pulmonary veins with potentially a common ostium.  The  left superior pulmonary vein was noted to be small and the left inferior  pulmonary vein was noted to be moderate in size.  The right superior and  right inferior pulmonary veins were moderate in size.  There was no  evidence of pulmonary vein stenosis.  The angiographic catheter was then  removed.  The His bundle catheter was removed, and in its place, a 3.5  mm Biosense Webster [employer] ablation catheter was advanced  into the right atrium.  The transseptal sheath was pulled back into the  IVC over a guidewire.  The ablation catheter could not be advanced  through the transseptal hole due to scarring within the fossa ovalis  from the prior ablation procedure.  The transseptal sheath was,  therefore, re-advanced into the left atrium.  A 15-mm, 20-pole circular  mapping catheter was introduced through the transseptal sheath and  positioned over the mouth of all 4 pulmonary veins.  Three-dimensional  electrode anatomical mapping was performed using CARTO technology.  This  demonstrated that the left  superior, left inferior, and right inferior  pulmonary veins remained electrically silent from the prior ablation  procedure.  There was minimal return of electrical conduction within the  right superior pulmonary vein.  The circular catheter was then removed.  The ablation catheter was advanced through the transseptal sheath into  the left atrium.  The right superior pulmonary vein was successfully  electrically re-isolated and anatomically encircled using a wide atrial  circumferential ablation technique.  Following ablation around the right  superior and pulmonary vein, attention was turned to complex  fractionated atrial electrograms.  The patient was noted to have a  paucity of electrograms throughout the left atrium with the majority of  the electrograms along the lateral wall of the left atrium anteriorly  and above the coronary sinus along the mitral valve annulus.  A series  of radiofrequency applications were delivered in a linear fashion along  the roof of the left atrium between the right and left superior  pulmonary veins.  Additional radiofrequency applications were  delivered  in a linear fashion between the mitral valve annulus and the posterior  left atrium along the roof.  Complex fractionated atrial electrograms  were ablated along the posterior wall as well as the intra-atrial  septum.  Additional complex fractionated atrial electrograms were  ablated along the lateral wall of the left atrium between the left  inferior pulmonary vein and the mitral valve annulus in a linear  fashion.  Additional radiofrequency applications were delivered above  the coronary sinus.  Interestingly, the atrial fibrillation cycle length  appeared to regularize and slow significantly when ablating above the  coronary sinus; however, atrial fibrillation did not terminate.  Additional radiofrequency applications were, therefore, delivered above  the coronary sinus along the mitral valve annulus  as well as along the  lateral wall of the left atrium up to the left inferior pulmonary vein.  The ablation catheter was then pulled back into the right atrium, and  additional complex fractionated atrial electrograms were ablated within  the coronary sinus.  Following ablation, mapping was performed  throughout the right atrium, however, there were no significant complex  fractionated atrial electrograms to be ablated within the right atrium.  The patient was then cardioverted with a single 200-joule biphasic shock  but continued to have atrial fibrillation.  A 360-joule biphasic shock  with cardioversion electrodes in the anterior-posterior thoracic  configuration was then delivered, which successfully cardioverted the  patient to sinus bradycardia.  He remained in sinus rhythm thereafter.  His AH interval measured 168 milliseconds with an HV interval of 57  milliseconds.  His RR interval measured 1317 milliseconds with a PR  duration of 123 milliseconds, QRS duration of 90 milliseconds, and QT  interval of 510 milliseconds.  Ventricular pacing was performed, which  revealed VA dissociation at a baseline cycle length of 700 milliseconds.  Atrial pacing was performed, which revealed an AV Wenckebach cycle  length of 600 milliseconds.  The ablation catheter was then positioned  in the low lateral right atrium and differential atrial pacing was  performed, which confirmed that the patient continued to have complete  bidirectional isthmus block along the cavotricuspid isthmus from a prior  ablation procedure.  Isoproterenol was not infused today due to the  patient's history of hypertrophic cardiomyopathy.  The procedure was,  therefore, considered completed.  All catheters were removed and the  sheaths were aspirated and flushed.  Intracardiac echocardiography was  again performed, which revealed no pericardial effusion.  The patient  was transferred to the recovery area for sheath removal  per protocol.  A  limited bedside transthoracic echocardiogram revealed no pericardial  effusion.  There were no early apparent complications.   CONCLUSIONS:  1. Persistent atrial fibrillation.  2. The left superior, left inferior, and right inferior pulmonary      veins remained electrically silent from a prior ablation procedure.      There was minimal return of electrical conduction within the right      superior pulmonary vein, which was successfully electrically re-      isolated and anatomically encircled using a WACA technique today.  3. Complex fractionated atrial electrograms were identified and      ablated; however, the patient was noted to have a markedly enlarged      left atrium with a relative paucity of electrograms throughout.  4. Persistent cavotricuspid isthmus block from a prior ablation      procedure.  5. Successful cardioversion to sinus rhythm with a 360-joule biphasic  shock.  6. No early apparent complications.      Hillis Range, MD  Electronically Signed     JA/MEDQ  D:  05/08/2009  T:  05/09/2009  Job:  [id number]   cc:   Arturo Morton. Riley Kill, MD, [facility]

## 2011-03-11 NOTE — Assessment & Plan Note (Signed)
[facility]                                 ON-CALL NOTE   ARTIST, BLOOM                   MRN:          [id number]  DATE:10/21/2008                            DOB:          Jul 29, 1941    PRIMARY CARDIOLOGIST:  Arturo Morton. Riley Kill, MD, [facility]   ELECTROPHYSIOLOGIST:  Hillis Range, MD   Mr. Stamps is a 70 year old male with a history of atrial  fibrillation.  His wife called today because his heart beat was  irregular and she felt like he was back in AFib.  She stated he is not  having chest pain or shortness of breath, but does complain of general  malaise and fatigue.  She states that his Coumadin has been therapeutic  every time, it has been checked recently, and he is compliant with his  medications, which include amiodarone 200 mg daily and Coumadin.  She  states his heart rate was 86 the last time she took his blood pressure  and his blood pressure has been running fine.   I discussed the situation with Ms. Horn.  Mr. Keisling prefers not  to come into the hospital right now.  His symptoms started some time on  October 19, 2008, although he is not having chest pain or shortness of  breath, he does not feel like doing much.  However, he is reluctant to  come to the hospital in through the emergency room.   I requested she increase his amiodarone from 200 mg daily to 200 mg 2  tabs b.i.d.  I will leave a message with the office for him to be  contacted on Monday morning for followup.  She is aware that in the  meantime if he gets symptomatic then she can bring him into the  hospital.  I also recommended that she ambulate him and then check his  blood pressure and heart rate to make sure that his heart rate is not  out of control with activity as that might be a reason for him to come  in over the weekend.  Ms. Breach stated that she would do these things  and call back if she were going to bring him to the hospital.      Theodore Demark, PA-C  Electronically Signed      Doylene Canning. Ladona Ridgel, MD  Electronically Signed   RB/MedQ  DD: 10/21/2008  DT: 10/21/2008  Job #: [id number]

## 2011-03-11 NOTE — Consult Note (Signed)
NAME:  Bradley Winters, Bradley Winters            ACCOUNT NO.:  [id number]   MEDICAL RECORD NO.:  [id number]          PATIENT TYPE:  AMB   LOCATION:  ENDO                         FACILITY:  [facility]   PHYSICIAN:  Noralyn Pick. Eden Emms, MD, FACCDATE OF BIRTH:  1941/05/29   DATE OF CONSULTATION:  DATE OF DISCHARGE:  05/07/2009                                 CONSULTATION   A 70 year old patient who is to have an ablation by Dr. Johney Frame tomorrow.  INR at the time of the procedure was 2.8.  Study was done to rule out  left atrial appendage clot.   This patient was sedated with 100 mcg of fentanyl and 7 mg of Versed.  Using digital technique, an Omniplane probe was advanced into distal  esophagus without incident.   The patient had mild LVH, the EF was 60%.  There was no mural apical  thrombus.  Mitral valve was mildly thickened with mild MR.  There was  moderate left atrial enlargement.  Atrial septum was intact with no ASD  or PFO.  Right-sided cardiac chambers were normal.  Aortic valve was  trileaflet and minimally sclerotic.  Left atrial appendage was mildly  dilated.  There was mild spontaneous contrast.  There was no significant  thrombus.  The appendage was well viewed orthogonally.   Imaging of the aorta showed no significant debris.   FINAL IMPRESSION:  1. Minimal spontaneous contrast in the left atrium, no significant      thrombus.  The patient is cleared to have ablation.  2. Moderate left atrial enlargement.  3. Mild mitral regurgitation.  4. Aortic valve sclerosis.  5. Normal right-sided cardiac chambers.  6. Normal left ventricle, ejection fraction 60%.      Noralyn Pick. Eden Emms, MD, [facility]  Electronically Signed     PCN/MEDQ  D:  05/07/2009  T:  05/08/2009  Job:  [id number]

## 2011-03-11 NOTE — Assessment & Plan Note (Signed)
[facility]                            CARDIOLOGY OFFICE NOTE   VICENT, FEBLES                   MRN:          [id number]  DATE:03/06/2008                            DOB:          06-Oct-1941    Mr. Gladwin is here for follow-up.  In general, he has been stable.  He  continues to have a bit of a tremor in his hand.  He is on amiodarone  200 mg daily.  His most recent evaluations have included his pulmonary  function studies.  His DLCO was 91%.  His FEV-1 is 68%.  His last BMET  was unremarkable.  CK was 76.  His last TSH was nearly a year ago.  He  has continued to remain stable, however.   On physical today, the blood pressure is 126/60, pulse 56.  The lung fields are clear.  The cardiac rhythm reveals a systolic ejection murmur.  There are no  diastolic murmurs appreciated.   The patient and I had a long ranging discussion today.  He has  paroxysmal atrial fibrillation.  He has been reasonably well controlled  by amiodarone.  His most recent episode occurred during his pulmonary  function testing.  We will need to continue to keep a fairly close eye  on his TSH.  His liver studies will need to be reviewed at some point.  I will see him back in follow-up in 4 months' time.     Arturo Morton. Riley Kill, MD, [employer] [facility]  Electronically Signed    TDS/MedQ  DD: 03/06/2008  DT: 03/06/2008  Job #: [id number]

## 2011-03-11 NOTE — Assessment & Plan Note (Signed)
OFFICE VISIT   JEWETT, MCGANN  DOB:  March 31, 1941                                       06/18/2010  ZOXWR#:[id number]   The patient presents today for continued discussion of his extensive  venous hypertension.  He has worn graduated compression stockings and  this has made no improvement in his pain and has actually made his legs  feel worse.  He works as an Electronics engineer with entertainment [employer] and  has to work 4-8 hour shifts standing up for prolonged period of time and  this is very difficult due to pain and swelling.  He also has difficulty  falling asleep with leg pain at night particularly if he has been on  them for a great deal of time.   Physical examination is unchanged.  He has significant swelling in both  legs, more so on the right than on the left, and marked changes of  venous hypertension.   I feel that he has clearly failed conservative therapy.  I have  recommended staged bilateral laser ablation of his great saphenous vein  for reduction of venous hypertension.  Due to his significant swelling I  feel that he may require knee-high compression following the procedure  when possible for symptom relief as well.  He is on chronic Coumadin  therapy for chronic atrial fibrillation.  We will stop this 3 days prior  to ablation.  He understands there is very slight risk of DVT with the  procedure and also understands some risk of nonclosure of the saphenous  vein on Coumadin.  We will proceed at his earliest convenience.     Larina Earthly, M.D.  Electronically Signed   TFE/MEDQ  D:  06/18/2010  T:  06/19/2010  Job:  [id number]   cc:   Arturo Morton. Riley Kill, MD, [facility]

## 2011-03-11 NOTE — Assessment & Plan Note (Signed)
[facility]   NAME:  Bradley Winters, Bradley Winters            ACCOUNT NO.:  [id number]   MEDICAL RECORD NO.:  [id number]      DATE OF BIRTH:  [birth year]   PHYSICIAN:  Madolyn Frieze. Jens Som, MD, [facility] VISIT DATE:  08/18/2007                                   OFFICE VISIT   CARDIOVERSION OF ATRIAL FIBRILLATION:  The patient was sedated with  Pentothal 250 mg intravenously.  Synchronized cardioversion (biphasic)  with 120 joules resulted in normal sinus rhythm.  There were no  immediate complications.  We would recommend continuing his Coumadin  with a goal INR of 2-3.      Madolyn Frieze Jens Som, MD, [facility]  Electronically Signed     BSC/MEDQ  D:  08/18/2007  T:  08/18/2007  Job:  [id number]

## 2011-03-11 NOTE — Discharge Summary (Signed)
NAME:  Bradley Winters, Bradley Winters NO.:  [id number]   MEDICAL RECORD NO.:  [id number]          PATIENT TYPE:  INP   LOCATION:  4707                         FACILITY:  [facility]   PHYSICIAN:  Duke Salvia, MD, FACCDATE OF BIRTH:  Nov 15, 1940   DATE OF ADMISSION:  11/23/2007  DATE OF DISCHARGE:  11/27/2007                               DISCHARGE SUMMARY   PRIMARY CARDIOLOGIST:  Maisie Fus D. Riley Kill, MD, [facility].   DISCHARGE DIAGNOSIS:  Paroxysmal atrial fibrillation.   SECONDARY DIAGNOSES:  Hypertrophic cardiomyopathy with an ejection  fraction of 55-65% without regional wall motion abnormalities and  moderate asymmetric septal hypertrophy noted on transesophageal  echocardiogram on November 26, 2007.   ALLERGIES:  No known drug allergies.   PROCEDURES:  Transesophageal echocardiogram and successful direct  current cardioversion.   HISTORY OF PRESENT ILLNESS:  A 70 year old male with prior history of  hypertrophic cardiomyopathy and paroxysmal atrial fibrillation on  chronic anticoagulation, on Coumadin therapy.  He underwent pulmonary  function testing on January 26th with bronchodilator challenge.  Afterwards, began to feel poorly with tachy palpitations and fatigue.  He noted an irregular heartbeat upon checking his pulse and subsequently  arranged an appointment to be seen in our office on January 27th.  He  was seen by Dr. Excell Seltzer.  Given his history of hypertrophic  cardiomyopathy and propensity towards diastolic heart failure in the  setting of rapid A fib, the decision was made to admit him for formal EP  evaluation and probable cardioversion.   HOSPITAL COURSE:  Upon admission, his INR was subtherapeutic at 1.8.  He  was maintained on Coumadin therapy.  He was also noted to have an  elevated TSH of 6.569; however, his free T4 and free T3 were normal.  He  was seen by Dr. Lewayne Bunting for electrophysiology consultation on  January 28th.  It was felt that patient would  benefit from continuation  of amiodarone therapy along with TEE and cardioversion.  Low dose oral  potassium was used for additional rate control.  TEE was performed on  January 30th, revealing normal LV function without evidence of left  atrial appendage thrombus.  Cardioversion was then performed using one  120 joule biphasic shock with restoration of sinus rhythm.  Mr. Spielmann  has maintained sinus rhythm since cardioversion and will be discharged  home today in satisfactory condition.   DISCHARGE LABS:  Hemoglobin 14.4, hematocrit 42.3, WBC 7.5, platelets  150, INR 2.7.  Sodium 139, potassium 3.2 (replaced prior to discharge),  chloride 104, CO2 29, BUN 25, creatinine 1.28, glucose 100, calcium 8.4,  total bilirubin 0.8, alkaline phosphatase 61, AST 20, ALT 17, total  protein 6.5, albumin 3.5, calcium 8.4, TSH 6.569, free T4 1.31, free T3  3.5.   DISPOSITION:  Patient is being discharged home today in good condition.   FOLLOW-UP PLANS/APPOINTMENTS:  We will arrange for followup with Dr.  Riley Kill in approximately 3-4 weeks.  Given his hypokalemia this morning,  we have replaced his potassium and will plan to follow up with BMET in  approximately one week.   DISCHARGE MEDICATIONS:  1. Amiodarone  200 mg daily.  2. Coumadin 1 mg daily with the exception of Thursdays, when he takes      2 mg.   OUTSTANDING LABS/STUDIES:  None.   DURATION OF DISCHARGE ENCOUNTER:  Forty minutes, including physician  time.      Nicolasa Ducking, ANP      Duke Salvia, MD, [facility]  Electronically Signed    CB/MEDQ  D:  11/27/2007  T:  11/27/2007  Job:  [id number]

## 2011-03-11 NOTE — Assessment & Plan Note (Signed)
[facility]                            CARDIOLOGY OFFICE NOTE   KIRE, FERG                   MRN:          [id number]  DATE:11/23/2007                            DOB:          October 03, 1941    CHIEF COMPLAINT:  Palpitations.   HISTORY OF PRESENT ILLNESS:  Mr. Hounshell is a 70 year old gentleman  with hypertrophic cardiomyopathy and paroxysmal atrial fibrillation.  He  has been maintained in sinus rhythm on amiodarone and is chronically  anticoagulated with Coumadin.  He had done well for several years up  until October 2008, when he developed atrial fibrillation with RVR.  He  was hospitalized and electrically cardioverted at that time.  He  remained in sinus rhythm until he developed new onset symptoms.  Yesterday, he went for formal pulmonary function testing with a  bronchodilator challenge yesterday, and reports that he has felt poorly  since that time.  Last evening, he took his pulse and he is experiencing  palpitations and rapid heart rate, and his heart rate was irregular and  elevated.  He denies chest pain or dyspnea.  He complains of palpations  and overall does not feel well.  He has had no recent change in his  lower extremity edema.  He felt well until yesterday afternoon.  He has  no other complaints today.   CURRENT MEDICATIONS:  1. Coumadin as directed.  2. Amiodarone 200 mg daily.   PAST MEDICAL HISTORY:  1. Includes hypertrophic cardiomyopathy.  Echocardiogram from January      2008 shows an LVEF of 55 to 65% with moderate basal septal      hypertrophy.  There is mild mitral regurgitation and moderate      dilatation of the left atrium.  2. Paroxysmal atrial fibrillation, as detailed above.   No other medical problems have been noted.   ALLERGIES:  No known drug allergies.   SOCIAL HISTORY:  The patient is married.  He has 3 children.  He is a  former smoker.  He does not drink alcohol.   FAMILY HISTORY:   Father died of myocardial infarction at age 71.   REVIEW OF SYSTEMS:  A complete 12-point review of systems was performed.  Positive to report is a tremor.  This has been long-standing but worse  recently.  It appears familial.  He says his children have the same  symptom.  He has chronic venous stasis problems with his legs.  All  other systems are reviewed and are negative except for detailed above.   PHYSICAL EXAM:  The patient is alert and oriented.  He is a very nice  gentleman in no acute distress.  Weight is 221, blood pressure 125/78, heart rate 105, respiratory rate  16.  HEENT:  Normal.  NECK:  Normal carotid upstrokes with transmitted heart sounds.  Jugular  venous pressure is normal.  There is no thyromegaly or thyroid nodules.  LUNGS:  Clear to auscultation bilaterally.  HEART:  Irregularly irregular with a 2/6 systolic murmur along the left  sternal border.  There are no gallops.  There are no diastolic murmurs.  There is no right ventricular heave or lift.  ABDOMEN:  Soft and nontender.  No organomegaly.  EXTREMITIES:  There is 1+ edema on the right and trace edema on the  left.  There are chronic stasis changes bilaterally.  SKIN:  Warm and dry.  NEUROLOGIC:  Cranial nerves 2-12 are intact.  Strength is equal and  intact bilaterally.   EKG shows atrial fibrillation with rapid ventricular response and left  axis deviation.   ASSESSMENT:  Mr. Ureta is a 70 year old gentleman with symptomatic  atrial fibrillation with rapid ventricular response.  In the setting of  his hypertrophic cardiomyopathy and poorly-tolerated atrial  fibrillation, I am going to admit him to the hospital once again.  He  was last cardioverted approximately 3 months ago.  He may require repeat  cardioversion.  I have spoken with Loura Pardon, PA, and have asked for  formal EP evaluation.  Mr. Curci may need a change in his therapy or  even consideration for ablation.  Will continue his  Coumadin and  amiodarone, and check baseline laboratory work.  Also will add low-dose  short-acting Diltiazem for better rate control.  Further plan is pending  EP evaluation.     Veverly Fells. Excell Seltzer, MD  Electronically Signed    MDC/MedQ  DD: 11/23/2007  DT: 11/23/2007  Job #: [id number]

## 2011-03-11 NOTE — Assessment & Plan Note (Signed)
[place] [facility]                         ELECTROPHYSIOLOGY OFFICE NOTE   KEO, SCHIRMER                   MRN:          [id number]  DATE:12/29/2008                            DOB:          [birth year]    INTRODUCTION:  Mr. Senna is a pleasant 70 year old gentleman with a  history of hypertrophic cardiomyopathy and atrial fibrillation, who  presents today for urgent appointment for symptomatic atrial  fibrillation.   PROBLEM LIST:  1. Persistent atrial fibrillation and typical-appearing atrial flutter      status post electrophysiology study and radiofrequency ablation for      atrial flutter and atrial fibrillation on December 07, 2008.  2. Hypertrophic cardiomyopathy.   MEDICATIONS:  1. Coumadin.  2. Amiodarone 400 mg twice daily.  3. Protonix 40 mg daily.   INTERVAL HISTORY:  Mr. Butner most recently presented with recurrent  symptomatic atrial fibrillation and underwent cardioversion on December 07, 2008.  He reports doing well with no further episodes of atrial  fibrillation until yesterday.  Unfortunately, he woke up with symptoms  of palpitations.  He returned to Dr. Rosalyn Charters office and was found to  have recurrent atypical atrial flutter with ventricular rates of 100  beats per minute.  He reports associated fatigue.  He now presents for  further management.  He notes that his fatigue and shortness of breath  have significantly improved.  His heart rate today is in the 80s.  He  denies palpitations, presyncope, syncope, or other concerns.   PHYSICAL EXAMINATION:  VITAL SIGNS:  Blood pressure 122/80, heart rate  87, respirations 18, and weight 221 pounds.  GENERAL:  The patient is an anxious-appearing gentleman in no acute  distress.  He is alert and oriented x3.  HEENT:  Normocephalic and atraumatic.  Sclerae clear.  Conjunctivae  pink.  Oropharynx clear.  NECK:  Supple.  No thyromegaly, JVD, or bruits.  LUNGS:  Clear to  auscultation bilaterally.  HEART:  Irregularly irregular rhythm, 2/6 systolic ejection murmur along  the left sternal border.  GI:  Soft, nontender, and nondistended.  Positive bowel sounds.  EXTREMITIES:  No clubbing, cyanosis, 1+ lower extremity edema  bilaterally.  NEUROLOGIC:  Cranial nerves II-XII are intact.  Strength and sensation  are intact.  SKIN:  No ecchymosis or lacerations.   EKG today documents atypical-appearing atrial flutter with an average  ventricular rate of 85 beats per minute.  The patient has an anterior  and inferior infarct pattern.   IMPRESSION:  Mr. Farrelly presents today for followup.  He now appears  to have early recurrence of atrial fibrillation and atypical-appearing  atrial flutter following his ablation.  I suspect that he may have post  ablation inflammatory changes within his left atrium.  I think,  presently we should continue to treat him with his current regimen of  the amiodarone and Coumadin.  I think that he would benefit from rate  control intermittently.  I have therefore given the patient a  prescription today for diltiazem short acting 30 mg q.8 h. p.r.n. if  heart rates above 85 beats per minute.  Given his history  of  hypertrophic cardiomyopathy with a known gradient he may benefit from  long-acting Cardizem long term as well.  I have asked the patient to  contact our office on Monday.  If he is not return to sinus rhythm over  the weekend, he may again require cardioversion.  In long term, I think  that we need to decrease the patient's amiodarone dose.  However, in the  short term, as he recovers from his ablation, I think that we should  continue his present dose of 400 mg twice daily.     Hillis Range, MD  Electronically Signed    JA/MedQ  DD: 12/29/2008  DT: 12/30/2008  Job #: [id number]   cc:   Arturo Morton. Riley Kill, MD, [facility]

## 2011-03-11 NOTE — Discharge Summary (Signed)
NAME:  Bradley Winters, Bradley Winters NO.:  [id number]   MEDICAL RECORD NO.:  [id number]          PATIENT TYPE:  INP   LOCATION:  3709                         FACILITY:  [facility]   PHYSICIAN:  Pricilla Riffle, MD, FACCDATE OF BIRTH:  Mar 15, 1941   DATE OF ADMISSION:  01/29/2008  DATE OF DISCHARGE:  01/31/2008                               DISCHARGE SUMMARY   PROCEDURES:  1. Transesophageal echocardiogram.  2. Direct current cardioversion.   PRIMARY FINAL DISCHARGE DIAGNOSIS:  Paroxysmal atrial fibrillation.   SECONDARY DIAGNOSES:  1. Hypertrophic obstructive cardiomyopathy with an ejection fraction      of 60%, normal right ventricle, dilated left atrium, no left atrial      appendage thrombus, mild mitral valve prolapse in the anterior      leaflet with moderate posterior mitral regurgitation, trivial      tricuspid regurgitation, lipomatous hypertrophy of the intraatrial      septum, but no atrial septal defect or patent foramen ovale and      mild-to-moderate plaque in the descending aorta by transesophageal      echocardiogram this admission.  2. Minimally elevated TSH of 5.895, follow up with primary care      physician.  3. Chronic anticoagulation with Coumadin, INR 3.0 at discharge.  4. Possible obstructive sleep apnea, follow up with primary care      physician.  5. History of colon polyps.  6. Family history of coronary artery disease in his father (not      premature).   Time at discharge 33 minutes.   HOSPITAL COURSE:  Mr. Cardiff is a 70 year old male with a history of  hypertrophic obstructive cardiomyopathy.  He had atrial fibrillation and  was symptomatic, so he was admitted for further evaluation and possible  cardioversion.   He had some problems with bradycardia while in AFib and a heart rate  that occasionally dropped into the 40s.  He was on IV Cardizem at that  time.  His blood pressure was stable and the IV Cardizem dose was  adjusted which normalized  his heart rate.   On April, 6, 2009, his INR was confirmed to be therapeutic.  He had a  TEE cardioversion and was successfully converted to sinus rhythm with a  biphasic shock at 150 joules.  Postprocedure, he was maintaining sinus  rhythm and was therefore considered stable for discharge with close  outpatient followup.   DISCHARGE INSTRUCTIONS:  1. His activity levels to be increased gradually.  2. He is to stick to a low-sodium, heart-healthy diet.  3. He is to follow up with the [facility] on Thursday, April 9,      at 4:15.  4. He is to follow up with Dr. Riley Kill on May 11, at 4:30.  He is      strongly encouraged to obtain a primary care physician for      evaluation of noncardiac issues.   DISCHARGE MEDICATIONS:  1. Amiodarone 200 mg daily.  2. Coumadin 1 mg daily, except for 2 mg on Thursday.      Theodore Demark, PA-C      Bradley Winters  Bradley Freshwater, MD, [facility]  Electronically Signed    RB/MEDQ  D:  01/31/2008  T:  02/01/2008  Job:  [id number]   cc:   Arturo Morton. Riley Kill, MD, [facility]

## 2011-03-11 NOTE — Assessment & Plan Note (Signed)
[facility]                            CARDIOLOGY OFFICE NOTE   Bradley Winters, Bradley Winters                   MRN:          [id number]  DATE:08/21/2008                            DOB:          1940-12-24    Bradley Winters is in for followup.  In general, he really is doing pretty  well.  In the interim, I have had him see Dr. Johney Frame.  He is considering  the possibility of an atrial fibrillation ablation.  The patient has had  paroxysmal atrial fibrillation and has well-defined hypertrophic  cardiomyopathy.  Importantly, the patient has not had increasing  shortness of breath with effort or major symptomatology from the  hypertrophic cardiomyopathy except when he is in atrial fibrillation  then he clearly feels worse.  We have been discussing the possibility of  an AFib ablation for quite sometime.  He has been on amiodarone  prophylaxis, and we have discussed this in detail.  We have also talked  to him about the genetics, and he has been on his son about this for  quite some time, but his son has refused to have any kind of evaluation  done.  He has been in and out of atrial fib a couple of times during the  past year.  He has thought about the atrial fibrillation ablation and  has considered a number of things.  He still has not decided and I told  we would continue to have an ongoing dialogue about this.  I do have  concerns about long-term amiodarone, and he clearly has had some side  effects related to achieve this.   His current medications include Coumadin as directed and amiodarone 200  mg daily.   On physical examination, he is alert and oriented in no distress.  The  weight is 218 pounds.  The blood pressure 128/62, the pulse is 60.  The  lung fields are clear.  There is a systolic ejection murmur compatible  with hypertrophic cardiomyopathy.   IMPRESSION:  1. Hypertrophic obstructive cardiomyopathy.  2. Paroxysmal atrial fibrillation.   DISPOSITION:  1. Return to clinic in 3 months.  2. Continue consideration of all options with regard to treatment.  3. Follow up with Ophthalmology.     Bradley Winters. Riley Kill, MD, [facility]  Electronically Signed    TDS/MedQ  DD: 08/23/2008  DT: 08/23/2008  Job #: [id number]

## 2011-03-12 ENCOUNTER — Encounter: Payer: Medicare Other | Admitting: *Deleted

## 2011-03-13 ENCOUNTER — Ambulatory Visit (INDEPENDENT_AMBULATORY_CARE_PROVIDER_SITE_OTHER): Payer: Medicare Other | Admitting: *Deleted

## 2011-03-13 DIAGNOSIS — I4891 Unspecified atrial fibrillation: Secondary | ICD-10-CM

## 2011-03-13 DIAGNOSIS — I4892 Unspecified atrial flutter: Secondary | ICD-10-CM

## 2011-03-14 NOTE — Assessment & Plan Note (Signed)
[facility]                            CARDIOLOGY OFFICE NOTE   Bradley Winters, Bradley Winters                MRN:          [id number]  DATE:10/05/2006                            DOB:          03/13/1941    Mr. Eastman is in for followup.  He says he really feels great.  He is  working part-time, and he has considered going back to school on a  regular basis.  He denies any syncope or presyncope.   CURRENT MEDICATIONS:  1. Warfarin with a targeted INR of 2 to 3, requiring in the range of 8      mg weekly.  2. Amiodarone 200 mg daily for control of poorly tolerated atrial      fibrillation.   Last pulmonary function studies were in February of 2007.  A DLCO was  100% of predicted.  His FEV1 was 94% and FVC 100%, with an FEV to FVC  ratio of 65% which falls within normal range.  He had a mildly depressed  FEF of 47% compatible with early obstructive airway disease with a long  history of smoking.   PHYSICAL EXAMINATION:  GENERAL:  He is alert and oriented.  VITAL SIGNS:  Currently, the blood pressure is 162/80 but on recheck  today was 138/70 with a pulse of 58.  LUNGS:  Lung fields actually were quite clear without rhonchi or rales.  HEART:  The PMI was minimally displaced.  There was a harsh systolic  ejection murmur, and this murmur is quieted by squatting and brought out  by standing.  EXTREMITIES:  No significant edema.   The patient's electrocardiogram demonstrates normal sinus rhythm.  There  was a first-degree AV block with a PR interval of 228 milliseconds and a  corrected QTC of 473 milliseconds on chronic amiodarone therapy.   IMPRESSION:  1. Hypertrophic obstructive cardiomyopathy, as previously defined.  2. History of paroxysmal atrial fibrillation.  3. Prior smoking history.   RECOMMENDATIONS:  1. Repeat liver profile.  2. Repeat thyroid profile.  3. Repeat 2-D echocardiogram.  4. The patient will eventually need a chest  x-ray.     Arturo Morton. Riley Kill, MD, [facility]  Electronically Signed    TDS/MedQ  DD: 11/10/2006  DT: 11/10/2006  Job #: [id number]

## 2011-03-14 NOTE — Op Note (Signed)
NAME:  Bradley Winters, Bradley Winters                      ACCOUNT NO.:  [id number]   MEDICAL RECORD NO.:  [id number]                   PATIENT TYPE:  AMB   LOCATION:  ENDO                                 FACILITY:  [facility]   PHYSICIAN:  John C. Madilyn Fireman, M.D.                 DATE OF BIRTH:  1941-07-06   DATE OF PROCEDURE:  01/03/2004  DATE OF DISCHARGE:                                 OPERATIVE REPORT   PROCEDURE:  Colonoscopy with polypectomy.   INDICATIONS FOR PROCEDURE:  History of colon polyps on initial colonoscopy  two months ago with one polyp rather large and difficult to retrieve and  felt likely  not to be removed in its entirety.   DESCRIPTION OF PROCEDURE:  The patient was placed in the left lateral  decubitus position then placed on the pulse monitor with continuous low flow  oxygen delivered by nasal cannula. He was sedated with 75 mcg IV fentanyl  and 7 mg IV Versed. The Olympus video colonoscope was inserted into the  rectum and advanced to the cecum, confirmed by transillumination at  McBurney's point and visualization of the ileocecal valve and appendiceal  orifice. The prep was good. The cecum appeared normal with no masses,  polyps, diverticula or other mucosal abnormalities.  Within the ascending  colon, there was seen a small 6-7 mm sessile polyp that was fulgurated by  hot biopsy and no other abnormalities. Two similar polyps were seen and also  fulgurated by hot biopsy in the transverse colon.  In the descending colon  corresponding to the area of the previously seen large polyp, there was some  residual polyp and this was in a difficult location to address just behind a  sharp fold. With considerable difficulty, I was eventually able to get a  snare around it and it appeared it fulgurated in its entirety. I touched the  base of the remaining polypectomy site with the hot biopsy polyp until the  entire surface appeared white with no visible viable polyp remaining.  There  was  an 8 mm slightly pedunculated polyp that was removed by snare and sent  in a second specimen container with the descending colon polyp. There were a  few scattered sigmoid diverticula as well. The rectum appeared normal and  retroflexed view of the anus revealed no obvious internal hemorrhoids. The  scope was then withdrawn and the patient returned to the recovery room in  stable condition. The patient tolerated the procedure well and there were no  immediate complications.   IMPRESSION:  1. Small ascending and transverse colon polyps.  2. Residual polyp in the descending colon felt to be removed completely.  3. Small sigmoid polyp.   PLAN:  Await all biopsy results and will probably pursue next colonoscopy in  three years.  John C. Madilyn Fireman, M.D.    JCH/MEDQ  D:  01/03/2004  T:  01/03/2004  Job:  [id number]   cc:   Arturo Morton. Riley Kill, M.D.

## 2011-03-14 NOTE — Op Note (Signed)
NAME:  Bradley Winters, Bradley Winters                      ACCOUNT NO.:  [id number]   MEDICAL RECORD NO.:  [id number]                   PATIENT TYPE:  AMB   LOCATION:  ENDO                                 FACILITY:  [facility]   PHYSICIAN:  John C. Madilyn Fireman, M.D.                 DATE OF BIRTH:  1941-04-09   DATE OF PROCEDURE:  11/01/2003  DATE OF DISCHARGE:                                 OPERATIVE REPORT   PROCEDURE PERFORMED:  Colonoscopy with polypectomy.   ENDOSCOPIST:  Barrie Folk, M.D.   INDICATIONS FOR PROCEDURE:  Family history of colon cancer in a first degree  relative.   DESCRIPTION OF PROCEDURE:  The patient was placed in the left lateral  decubitus position and placed on the pulse monitor with continuous low-flow  oxygen delivered by nasal cannula.  The patient was sedated with 75 mcg of  IV fentanyl and 6 mg of IV Versed.  The Olympus video colonoscope was  inserted into the rectum and advanced to the cecum, confirmed by  transillumination of McBurney's point and visualization of the ileocecal  valve and appendiceal orifice.  The prep was excellent.  The cecum,  ascending, and transverse colon appeared normal with no masses, polyps,  diverticula or other mucosal abnormalities.  Near the splenic flexure at  approximately 70 cm there was a 1 cm sessile polyp that was removed by  snare.  In the descending colon at approximately 60 cm a somewhat larger,  broad 2 cm polyp was vaguely delineated from the surrounding mucosa with no  stalk.  It was somewhat slippery and difficult to get a snare underneath the  entire polyp.  I tried to accomplish this and shaved off part of the polyp.  There was some residual polyp remaining.  The second snare attempt, I was  able to retrieve the remaining polyp elements.  It still appeared to be that  there might be some residual polyp remaining which I elected not to more  aggressively fulgurate in this session.  The second polyp was in a separate  specimen  container.  There were two or three polyps less than 1 cm in  diameter in the sigmoid and rectum which I chose not to address with feeling  that he would probably need repeat colonoscopy in one to two months to  address residual persistence of the larger polyp if this polyp did not prove  to be malignant or require surgery.  No diverticula were seen and no other  abnormalities of the sigmoid or rectum.  The scope was then withdrawn and  the patient returned to the recovery room in stable condition.  He tolerated  the procedure well.  There were no immediate complications.   IMPRESSION:  1. Splenic flexure polyp.  2. Larger descending colon polyp, possibly incompletely removed.  3. Two or three polyps in the sigmoid less than 1 cm in diameter.   PLAN:  Await  all histology and if no malignancy or otherwise the patient  does not require surgery, will probably repeat colonoscopy in approximately  two months.                                               John C. Madilyn Fireman, M.D.    JCH/MEDQ  D:  11/01/2003  T:  11/01/2003  Job:  [id number]   cc:   Arturo Morton. Riley Kill, M.D.

## 2011-04-10 ENCOUNTER — Ambulatory Visit (INDEPENDENT_AMBULATORY_CARE_PROVIDER_SITE_OTHER): Payer: Medicare Other | Admitting: *Deleted

## 2011-04-10 DIAGNOSIS — I4891 Unspecified atrial fibrillation: Secondary | ICD-10-CM

## 2011-04-10 DIAGNOSIS — I4892 Unspecified atrial flutter: Secondary | ICD-10-CM

## 2011-04-10 LAB — POCT INR: INR: 1.7

## 2011-04-16 ENCOUNTER — Telehealth: Payer: Self-pay | Admitting: Cardiology

## 2011-04-16 NOTE — Telephone Encounter (Signed)
Patient is currently taking 20 mg of Lasix daily. He is c/o right leg edema and states that his right leg is weeping. No SOB or CP. His left leg is not swollen. He has been walking a lot. I did advise patient to relax and elevate his leg. Dr. Riley Kill does not want to increase his Lasix  due to his hypertrophic obstructive cardiomyopathy. Advised patient to try and keep his right leg elevated and to call us back by Friday if his symptoms continue. May need an ultrasound of his leg.

## 2011-04-16 NOTE — Telephone Encounter (Signed)
Pt has been on decreased dose of furosimide for about a year and now today he noticed his right leg was weeping again and he has some edema in his leg and is very concerned and wants to know what he needs to do

## 2011-05-08 ENCOUNTER — Ambulatory Visit (INDEPENDENT_AMBULATORY_CARE_PROVIDER_SITE_OTHER): Payer: Medicare Other | Admitting: *Deleted

## 2011-05-08 DIAGNOSIS — I4891 Unspecified atrial fibrillation: Secondary | ICD-10-CM

## 2011-05-08 DIAGNOSIS — I4892 Unspecified atrial flutter: Secondary | ICD-10-CM

## 2011-05-08 LAB — POCT INR: INR: 2.3

## 2011-05-16 ENCOUNTER — Encounter: Payer: Self-pay | Admitting: Cardiology

## 2011-05-20 ENCOUNTER — Encounter: Payer: Self-pay | Admitting: Cardiology

## 2011-05-20 ENCOUNTER — Ambulatory Visit (INDEPENDENT_AMBULATORY_CARE_PROVIDER_SITE_OTHER): Payer: Medicare Other | Admitting: Cardiology

## 2011-05-20 VITALS — BP 122/62 | HR 86 | Ht 71.0 in | Wt 219.0 lb

## 2011-05-20 DIAGNOSIS — I421 Obstructive hypertrophic cardiomyopathy: Secondary | ICD-10-CM

## 2011-05-20 DIAGNOSIS — I4891 Unspecified atrial fibrillation: Secondary | ICD-10-CM

## 2011-05-20 DIAGNOSIS — R609 Edema, unspecified: Secondary | ICD-10-CM

## 2011-05-20 DIAGNOSIS — I10 Essential (primary) hypertension: Secondary | ICD-10-CM

## 2011-05-20 LAB — BASIC METABOLIC PANEL
CO2: 28 mEq/L (ref 19–32)
Calcium: 9.1 mg/dL (ref 8.4–10.5)
GFR: 62.33 mL/min (ref >60.00)
Sodium: 144 mEq/L (ref 135–145)

## 2011-05-20 NOTE — Assessment & Plan Note (Signed)
Continue to follow.  He has been stable from this standpoint.  AF may have helped obstruction to some extent.

## 2011-05-20 NOTE — Assessment & Plan Note (Signed)
Rate is controlled.  On medical therapy.

## 2011-05-20 NOTE — Patient Instructions (Signed)
Your physician recommends that you schedule a follow-up appointment in: 4 months  

## 2011-05-20 NOTE — Assessment & Plan Note (Signed)
About baseline on low dose diuretics.  Check BMET and Mg today.  No active lesions.

## 2011-05-20 NOTE — Progress Notes (Signed)
HPI:  Had a small ulcer develop on the R leg, but popped, and no drainage.  They have been keeping clean.  He thinks he is doing pretty well.  Denies any chest pain.  No shortness of breath.     Current Outpatient Prescriptions  Medication Sig Dispense Refill  . acetaminophen (TYLENOL) 325 MG tablet Take 650 mg by mouth every 6 (six) hours as needed.        . diltiazem (TIAZAC) 360 MG 24 hr capsule Take 1 tablet by mouth daily.      . furosemide (LASIX) 20 MG tablet Take 0.5 tablets by mouth daily.       . metoprolol succinate (TOPROL-XL) 25 MG 24 hr tablet Take 1 tablet by mouth daily.      . potassium chloride SA (K-DUR,KLOR-CON) 20 MEQ tablet Take by mouth daily. Take 1/2 tablet       . warfarin (COUMADIN) 2 MG tablet Take 1 mg by mouth daily.         No Known Allergies  Past Medical History  Diagnosis Date  . HTN (hypertension)   . Hypertrophic cardiomyopathy   . Persistent atrial fibrillation   . Venous insufficiency     No past surgical history on file.  Family History  Problem Relation Age of Onset  . Colon cancer      History   Social History  . Marital Status: Married    Spouse Name: N/A    Number of Children: N/A  . Years of Education: N/A   Occupational History  . retired    Social History Main Topics  . Smoking status: Former Smoker    Quit date: 10/27/1998  . Smokeless tobacco: Not on file  . Alcohol Use: No  . Drug Use: Not on file  . Sexually Active: Not on file   Other Topics Concern  . Not on file   Social History Narrative  . No narrative on file    ROS: Please see the HPI.  All other systems reviewed and negative.  PHYSICAL EXAM:  BP 122/62  Pulse 86  Ht 5\' 11"  (1.803 m)  Wt 219 lb (99.338 kg)  BMI 30.54 kg/m2  General: Well developed, well nourished, in no acute distress. Head:  Normocephalic and atraumatic. Neck: no JVD Lungs: Clear to auscultation and percussion. Heart: Normal S1.  Irregularly, irregular rhythm.  Soft SEM,  no dm. Abdomen:  Normal bowel sounds; soft; non tender; no organomegaly Pulses: Pulses normal in all 4 extremities. Extremities: Bilateral venous stasis changes, right greater than left.  Small healing scar anterior aspect of R leg.   Neurologic: Alert and oriented x 3.  EKG:AF. Controlled ventricular response.  Delay in R Wave progression.  Leftward axis.  No acute changes.  ASSESSMENT AND PLAN:

## 2011-05-30 ENCOUNTER — Other Ambulatory Visit: Payer: Self-pay | Admitting: *Deleted

## 2011-05-30 MED ORDER — METOPROLOL SUCCINATE ER 25 MG PO TB24: 25.0000 mg | ORAL_TABLET | Freq: Every day | ORAL | Status: DC

## 2011-06-05 ENCOUNTER — Ambulatory Visit (INDEPENDENT_AMBULATORY_CARE_PROVIDER_SITE_OTHER): Payer: Medicare Other | Admitting: *Deleted

## 2011-06-05 DIAGNOSIS — I4892 Unspecified atrial flutter: Secondary | ICD-10-CM

## 2011-06-05 DIAGNOSIS — I4891 Unspecified atrial fibrillation: Secondary | ICD-10-CM

## 2011-06-05 LAB — POCT INR: INR: 1.9

## 2011-06-09 ENCOUNTER — Other Ambulatory Visit: Payer: Self-pay | Admitting: Cardiology

## 2011-06-26 ENCOUNTER — Ambulatory Visit (INDEPENDENT_AMBULATORY_CARE_PROVIDER_SITE_OTHER): Payer: Medicare Other | Admitting: *Deleted

## 2011-06-26 DIAGNOSIS — I4892 Unspecified atrial flutter: Secondary | ICD-10-CM

## 2011-06-26 DIAGNOSIS — I4891 Unspecified atrial fibrillation: Secondary | ICD-10-CM

## 2011-06-26 LAB — POCT INR: INR: 2.7

## 2011-07-17 LAB — T4, FREE: Free T4: 1.31

## 2011-07-17 LAB — COMPREHENSIVE METABOLIC PANEL
ALT: 17
AST: 20
CO2: 29
Chloride: 104
Creatinine, Ser: 1.11
GFR calc Af Amer: >60
GFR calc non Af Amer: >60
Glucose, Bld: 91
Total Bilirubin: 0.8

## 2011-07-17 LAB — BASIC METABOLIC PANEL
BUN: 25 — ABNORMAL HIGH
Calcium: 8.4
Creatinine, Ser: 1.28
GFR calc non Af Amer: 56 — ABNORMAL LOW

## 2011-07-17 LAB — CBC
HCT: 44.8
Hemoglobin: 15.1
MCHC: 33.8
MCV: 91
Platelets: 150
Platelets: 160
RBC: 4.93
RDW: 14.3
WBC: 7.5
WBC: 9.4

## 2011-07-17 LAB — PROTIME-INR
INR: 2.5 — ABNORMAL HIGH
Prothrombin Time: 24.8 — ABNORMAL HIGH
Prothrombin Time: 28.2 — ABNORMAL HIGH
Prothrombin Time: 29.8 — ABNORMAL HIGH

## 2011-07-22 LAB — BASIC METABOLIC PANEL
BUN: 17
Chloride: 107
Creatinine, Ser: 1.28
Glucose, Bld: 103 — ABNORMAL HIGH

## 2011-07-22 LAB — POCT CARDIAC MARKERS
CKMB, poc: 1.3
Myoglobin, poc: 67.9

## 2011-07-22 LAB — POCT I-STAT, CHEM 8
Calcium, Ion: 1.19
Chloride: 104
Glucose, Bld: 94
HCT: 45

## 2011-07-22 LAB — CBC
MCHC: 33.8
MCHC: 34.2
MCV: 91.9
Platelets: 130 — ABNORMAL LOW
RBC: 4.72
RDW: 14.8
WBC: 6.8
WBC: 8.1

## 2011-07-22 LAB — TSH: TSH: 5.895 — ABNORMAL HIGH

## 2011-07-22 LAB — PROTIME-INR
INR: 2.5 — ABNORMAL HIGH
Prothrombin Time: 27.9 — ABNORMAL HIGH
Prothrombin Time: 30.5 — ABNORMAL HIGH

## 2011-07-24 ENCOUNTER — Encounter: Payer: Medicare Other | Admitting: *Deleted

## 2011-07-30 LAB — DIFFERENTIAL
Basophils Relative: 1
Eosinophils Absolute: 0.2
Lymphs Abs: 2.1
Monocytes Absolute: 0.6
Monocytes Relative: 9

## 2011-07-30 LAB — POCT I-STAT, CHEM 8
BUN: 33 — ABNORMAL HIGH
Calcium, Ion: 1.1 — ABNORMAL LOW
Chloride: 107
Glucose, Bld: 95
Potassium: 3.1 — ABNORMAL LOW

## 2011-07-30 LAB — CBC
HCT: 44
Hemoglobin: 14.6
MCHC: 33.2
MCV: 94.2
RBC: 4.67

## 2011-07-30 LAB — POCT CARDIAC MARKERS
CKMB, poc: 1.9
Myoglobin, poc: 92.7

## 2011-07-31 LAB — COMPREHENSIVE METABOLIC PANEL WITH GFR
Alkaline Phosphatase: 51 U/L (ref 39–117)
BUN: 18 mg/dL (ref 6–23)
Chloride: 100 meq/L (ref 96–112)
Creatinine, Ser: 1.12 mg/dL (ref 0.4–1.5)
GFR calc non Af Amer: >60 mL/min (ref >60)
Glucose, Bld: 95 mg/dL (ref 70–99)
Potassium: 3.9 meq/L (ref 3.5–5.1)
Total Bilirubin: 0.6 mg/dL (ref 0.3–1.2)

## 2011-07-31 LAB — COMPREHENSIVE METABOLIC PANEL
ALT: 15 U/L (ref 0–53)
AST: 21 U/L (ref 0–37)
Albumin: 3.1 g/dL — ABNORMAL LOW (ref 3.5–5.2)
CO2: 28 mEq/L (ref 19–32)
Calcium: 8.4 mg/dL (ref 8.4–10.5)
GFR calc Af Amer: >60 mL/min (ref >60)
Sodium: 136 mEq/L (ref 135–145)
Total Protein: 6 g/dL (ref 6.0–8.3)

## 2011-07-31 LAB — APTT: aPTT: 40 s — ABNORMAL HIGH (ref 24–37)

## 2011-07-31 LAB — DIFFERENTIAL
Basophils Absolute: 0 K/uL (ref 0.0–0.1)
Basophils Relative: 1 % (ref 0–1)
Eosinophils Absolute: 0.2 10*3/uL (ref 0.0–0.7)
Eosinophils Relative: 3 % (ref 0–5)
Lymphocytes Relative: 22 % (ref 12–46)
Lymphs Abs: 1.4 10*3/uL (ref 0.7–4.0)
Monocytes Absolute: 0.8 10*3/uL (ref 0.1–1.0)
Monocytes Relative: 13 % — ABNORMAL HIGH (ref 3–12)
Neutro Abs: 3.8 K/uL (ref 1.7–7.7)
Neutrophils Relative %: 61 % (ref 43–77)

## 2011-07-31 LAB — CBC
HCT: 45.7 % (ref 39.0–52.0)
Hemoglobin: 15.3 g/dL (ref 13.0–17.0)
MCHC: 33.4 g/dL (ref 30.0–36.0)
MCV: 92.4 fL (ref 78.0–100.0)
Platelets: 146 K/uL — ABNORMAL LOW (ref 150–400)
RBC: 4.95 MIL/uL (ref 4.22–5.81)
RDW: 14.2 % (ref 11.5–15.5)
WBC: 6.2 K/uL (ref 4.0–10.5)

## 2011-07-31 LAB — PROTIME-INR
INR: 2.2 — ABNORMAL HIGH (ref 0.00–1.49)
Prothrombin Time: 25.7 s — ABNORMAL HIGH (ref 11.6–15.2)

## 2011-07-31 LAB — MAGNESIUM: Magnesium: 2.4 mg/dL (ref 1.5–2.5)

## 2011-07-31 LAB — TSH: TSH: 4.882 u[IU]/mL — ABNORMAL HIGH (ref 0.350–4.500)

## 2011-08-01 ENCOUNTER — Ambulatory Visit (INDEPENDENT_AMBULATORY_CARE_PROVIDER_SITE_OTHER): Payer: Medicare Other | Admitting: *Deleted

## 2011-08-01 DIAGNOSIS — I4892 Unspecified atrial flutter: Secondary | ICD-10-CM

## 2011-08-01 DIAGNOSIS — I4891 Unspecified atrial fibrillation: Secondary | ICD-10-CM

## 2011-08-01 LAB — POCT INR: INR: 2.1

## 2011-08-06 LAB — TSH: TSH: 5.193

## 2011-08-06 LAB — CBC
HCT: 45.1
Hemoglobin: 15
Platelets: 153
WBC: 7.2

## 2011-08-06 LAB — BASIC METABOLIC PANEL
GFR calc non Af Amer: >60
Potassium: 4.1
Sodium: 139

## 2011-08-06 LAB — APTT: aPTT: 34

## 2011-08-06 LAB — PROTIME-INR
INR: 2 — ABNORMAL HIGH
INR: 2.1 — ABNORMAL HIGH

## 2011-08-29 ENCOUNTER — Ambulatory Visit (INDEPENDENT_AMBULATORY_CARE_PROVIDER_SITE_OTHER): Payer: Medicare Other | Admitting: *Deleted

## 2011-08-29 DIAGNOSIS — I4891 Unspecified atrial fibrillation: Secondary | ICD-10-CM

## 2011-08-29 DIAGNOSIS — I4892 Unspecified atrial flutter: Secondary | ICD-10-CM

## 2011-08-29 LAB — POCT INR: INR: 3.2

## 2011-09-08 ENCOUNTER — Other Ambulatory Visit: Payer: Self-pay | Admitting: *Deleted

## 2011-09-08 MED ORDER — WARFARIN SODIUM 2 MG PO TABS: 2.0000 mg | ORAL_TABLET | ORAL | Status: DC

## 2011-09-17 ENCOUNTER — Encounter: Payer: Medicare Other | Admitting: *Deleted

## 2011-09-18 ENCOUNTER — Other Ambulatory Visit: Payer: Self-pay | Admitting: Cardiology

## 2011-09-24 ENCOUNTER — Ambulatory Visit (INDEPENDENT_AMBULATORY_CARE_PROVIDER_SITE_OTHER): Payer: Medicare Other | Admitting: *Deleted

## 2011-09-24 DIAGNOSIS — I4891 Unspecified atrial fibrillation: Secondary | ICD-10-CM

## 2011-09-24 DIAGNOSIS — I4892 Unspecified atrial flutter: Secondary | ICD-10-CM

## 2011-10-17 ENCOUNTER — Ambulatory Visit (INDEPENDENT_AMBULATORY_CARE_PROVIDER_SITE_OTHER): Payer: Medicare Other | Admitting: Cardiology

## 2011-10-17 ENCOUNTER — Encounter: Payer: Self-pay | Admitting: Cardiology

## 2011-10-17 DIAGNOSIS — I421 Obstructive hypertrophic cardiomyopathy: Secondary | ICD-10-CM

## 2011-10-17 DIAGNOSIS — I4891 Unspecified atrial fibrillation: Secondary | ICD-10-CM

## 2011-10-17 DIAGNOSIS — D696 Thrombocytopenia, unspecified: Secondary | ICD-10-CM | POA: Insufficient documentation

## 2011-10-17 NOTE — Assessment & Plan Note (Signed)
Very mild, noted in past.  No bleeding.  Will recheck.

## 2011-10-17 NOTE — Patient Instructions (Addendum)
Your physician recommends that you have lab work: BMP and CBC (12/26 or 12/27)  Your physician has requested that you have an echocardiogram in 4 MONTHS. Echocardiography is a painless test that uses sound waves to create images of your heart. It provides your doctor with information about the size and shape of your heart and how well your heart's chambers and valves are working. This procedure takes approximately one hour. There are no restrictions for this procedure.  Your physician recommends that you continue on your current medications as directed. Please refer to the Current Medication list given to you today.  Your physician wants you to follow-up in: 4 MONTHS.  You will receive a reminder letter in the mail two months in advance. If you don't receive a letter, please call our office to schedule the follow-up appointment.

## 2011-10-17 NOTE — Assessment & Plan Note (Signed)
Rate is controlled.  Remains on medication to control rate.

## 2011-10-17 NOTE — Assessment & Plan Note (Signed)
Stable

## 2011-10-17 NOTE — Progress Notes (Signed)
   HPI:  He is doing pretty well.  No major symptoms.  He denies chest pain.  We reviewed his medications today.    Current Outpatient Prescriptions  Medication Sig Dispense Refill  . acetaminophen (TYLENOL) 325 MG tablet Take 650 mg by mouth every 6 (six) hours as needed.        . diltiazem (TIAZAC) 360 MG 24 hr capsule TAKE ONE CAPSULE BY MOUTH DAILY  90 capsule  3  . furosemide (LASIX) 20 MG tablet TAKE 2 TABLETS DAILY  60 tablet  5  . metoprolol succinate (TOPROL-XL) 25 MG 24 hr tablet Take 1 tablet (25 mg total) by mouth daily.  30 tablet  6  . potassium chloride SA (K-DUR,KLOR-CON) 20 MEQ tablet Take by mouth daily. Take 1/2 tablet       . warfarin (COUMADIN) 2 MG tablet Take 1 tablet (2 mg total) by mouth as directed.  45 tablet  2    No Known Allergies  Past Medical History  Diagnosis Date  . HTN (hypertension)   . Hypertrophic cardiomyopathy   . Persistent atrial fibrillation   . Venous insufficiency     No past surgical history on file.  Family History  Problem Relation Age of Onset  . Colon cancer      History   Social History  . Marital Status: Married    Spouse Name: N/A    Number of Children: N/A  . Years of Education: N/A   Occupational History  . retired    Social History Main Topics  . Smoking status: Former Smoker    Quit date: 10/27/1998  . Smokeless tobacco: Not on file  . Alcohol Use: No  . Drug Use: Not on file  . Sexually Active: Not on file   Other Topics Concern  . Not on file   Social History Narrative  . No narrative on file    ROS: Please see the HPI.  All other systems reviewed and negative.  PHYSICAL EXAM:  BP 142/77  Pulse 91  Resp 18  Ht 5\' 8"  (1.727 m)  Wt 101.334 kg (223 lb 6.4 oz)  BMI 33.97 kg/m2  General: Well developed, well nourished, in no acute distress. Head:  Normocephalic and atraumatic. Neck: no JVD Lungs: Clear to auscultation and percussion. Heart: Normal S1 and S2.  SEM.  Irregularly irregular  rhythm. Abdomen:  Normal bowel sounds; soft; non tender; no organomegaly Pulses: Pulses normal in all 4 extremities. Extremities: No clubbing or cyanosis.  Edema bilaterally left greater than right.  Neurologic: Alert and oriented x 3.  EKG:  Atrial fib with controlled ventricular response.   ASSESSMENT AND PLAN:

## 2011-10-22 ENCOUNTER — Ambulatory Visit (INDEPENDENT_AMBULATORY_CARE_PROVIDER_SITE_OTHER): Payer: Medicare Other | Admitting: *Deleted

## 2011-10-22 ENCOUNTER — Other Ambulatory Visit (INDEPENDENT_AMBULATORY_CARE_PROVIDER_SITE_OTHER): Payer: Medicare Other | Admitting: *Deleted

## 2011-10-22 DIAGNOSIS — I4891 Unspecified atrial fibrillation: Secondary | ICD-10-CM

## 2011-10-22 DIAGNOSIS — I421 Obstructive hypertrophic cardiomyopathy: Secondary | ICD-10-CM

## 2011-10-22 DIAGNOSIS — D696 Thrombocytopenia, unspecified: Secondary | ICD-10-CM

## 2011-10-22 DIAGNOSIS — I4892 Unspecified atrial flutter: Secondary | ICD-10-CM

## 2011-10-23 LAB — CBC WITH DIFFERENTIAL/PLATELET
Basophils Relative: 0.5 % (ref 0.0–3.0)
Eosinophils Absolute: 0.2 10*3/uL (ref 0.0–0.7)
HCT: 43.9 % (ref 39.0–52.0)
Lymphs Abs: 2.8 10*3/uL (ref 0.7–4.0)
MCHC: 33.7 g/dL (ref 30.0–36.0)
MCV: 93.9 fl (ref 78.0–100.0)
Monocytes Absolute: 0.7 10*3/uL (ref 0.1–1.0)
Neutrophils Relative %: 59 % (ref 43.0–77.0)
RBC: 4.67 Mil/uL (ref 4.22–5.81)

## 2011-10-23 LAB — BASIC METABOLIC PANEL
BUN: 24 mg/dL — ABNORMAL HIGH (ref 6–23)
Calcium: 9 mg/dL (ref 8.4–10.5)
Creatinine, Ser: 1 mg/dL (ref 0.4–1.5)
GFR: 81.11 mL/min (ref >60.00)
Glucose, Bld: 91 mg/dL (ref 70–99)
Potassium: 4 mEq/L (ref 3.5–5.1)

## 2011-11-19 ENCOUNTER — Ambulatory Visit (INDEPENDENT_AMBULATORY_CARE_PROVIDER_SITE_OTHER): Payer: Medicare Other | Admitting: *Deleted

## 2011-11-19 DIAGNOSIS — I4892 Unspecified atrial flutter: Secondary | ICD-10-CM

## 2011-11-19 DIAGNOSIS — I4891 Unspecified atrial fibrillation: Secondary | ICD-10-CM

## 2011-11-19 LAB — POCT INR: INR: 2.3

## 2011-12-17 ENCOUNTER — Ambulatory Visit (INDEPENDENT_AMBULATORY_CARE_PROVIDER_SITE_OTHER): Payer: Medicare Other | Admitting: *Deleted

## 2011-12-17 DIAGNOSIS — I4892 Unspecified atrial flutter: Secondary | ICD-10-CM

## 2011-12-17 DIAGNOSIS — I4891 Unspecified atrial fibrillation: Secondary | ICD-10-CM

## 2011-12-25 ENCOUNTER — Other Ambulatory Visit: Payer: Self-pay | Admitting: Cardiology

## 2011-12-25 MED ORDER — METOPROLOL SUCCINATE ER 25 MG PO TB24: 25.0000 mg | ORAL_TABLET | Freq: Every day | ORAL | Status: DC

## 2012-01-12 ENCOUNTER — Other Ambulatory Visit: Payer: Self-pay | Admitting: Cardiology

## 2012-01-14 ENCOUNTER — Other Ambulatory Visit: Payer: Self-pay | Admitting: Cardiology

## 2012-01-28 ENCOUNTER — Other Ambulatory Visit: Payer: Self-pay | Admitting: Cardiology

## 2012-01-28 ENCOUNTER — Ambulatory Visit (INDEPENDENT_AMBULATORY_CARE_PROVIDER_SITE_OTHER): Payer: Medicare Other | Admitting: Pharmacist

## 2012-01-28 DIAGNOSIS — I4892 Unspecified atrial flutter: Secondary | ICD-10-CM

## 2012-01-28 DIAGNOSIS — I4891 Unspecified atrial fibrillation: Secondary | ICD-10-CM

## 2012-01-30 IMAGING — CR DG CHEST 2V
2 series · 2 of 2 positions shown · non-contrast
Comparison: 10/21/2008

CLINICAL DATA: Shortness of breath.  Chest pain.  Bilateral leg
swelling.  Hypertrophic cardiomyopathy.  Chronic atrial
fibrillation.

CHEST - 2 VIEW

[w chest pa]
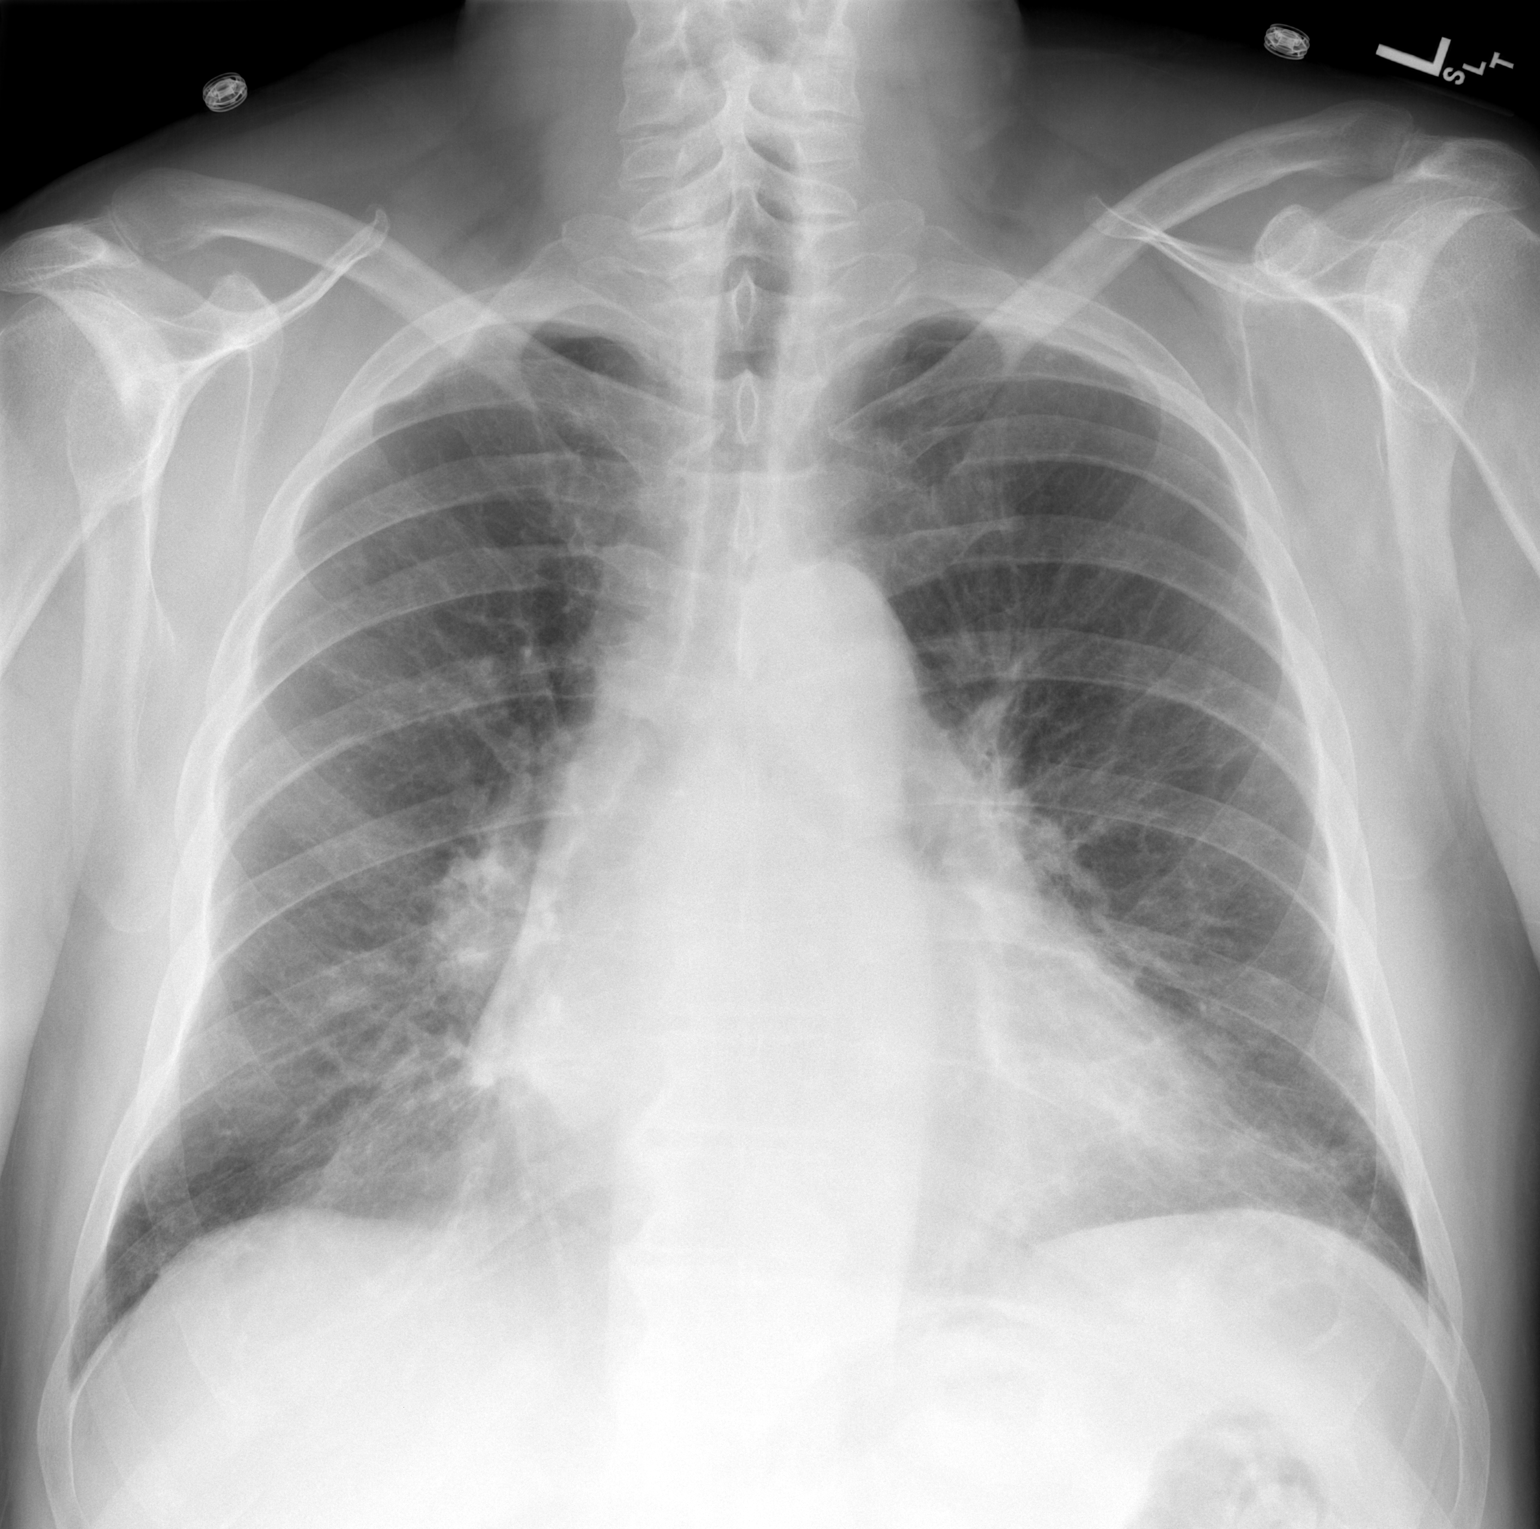

[w chest lat *]
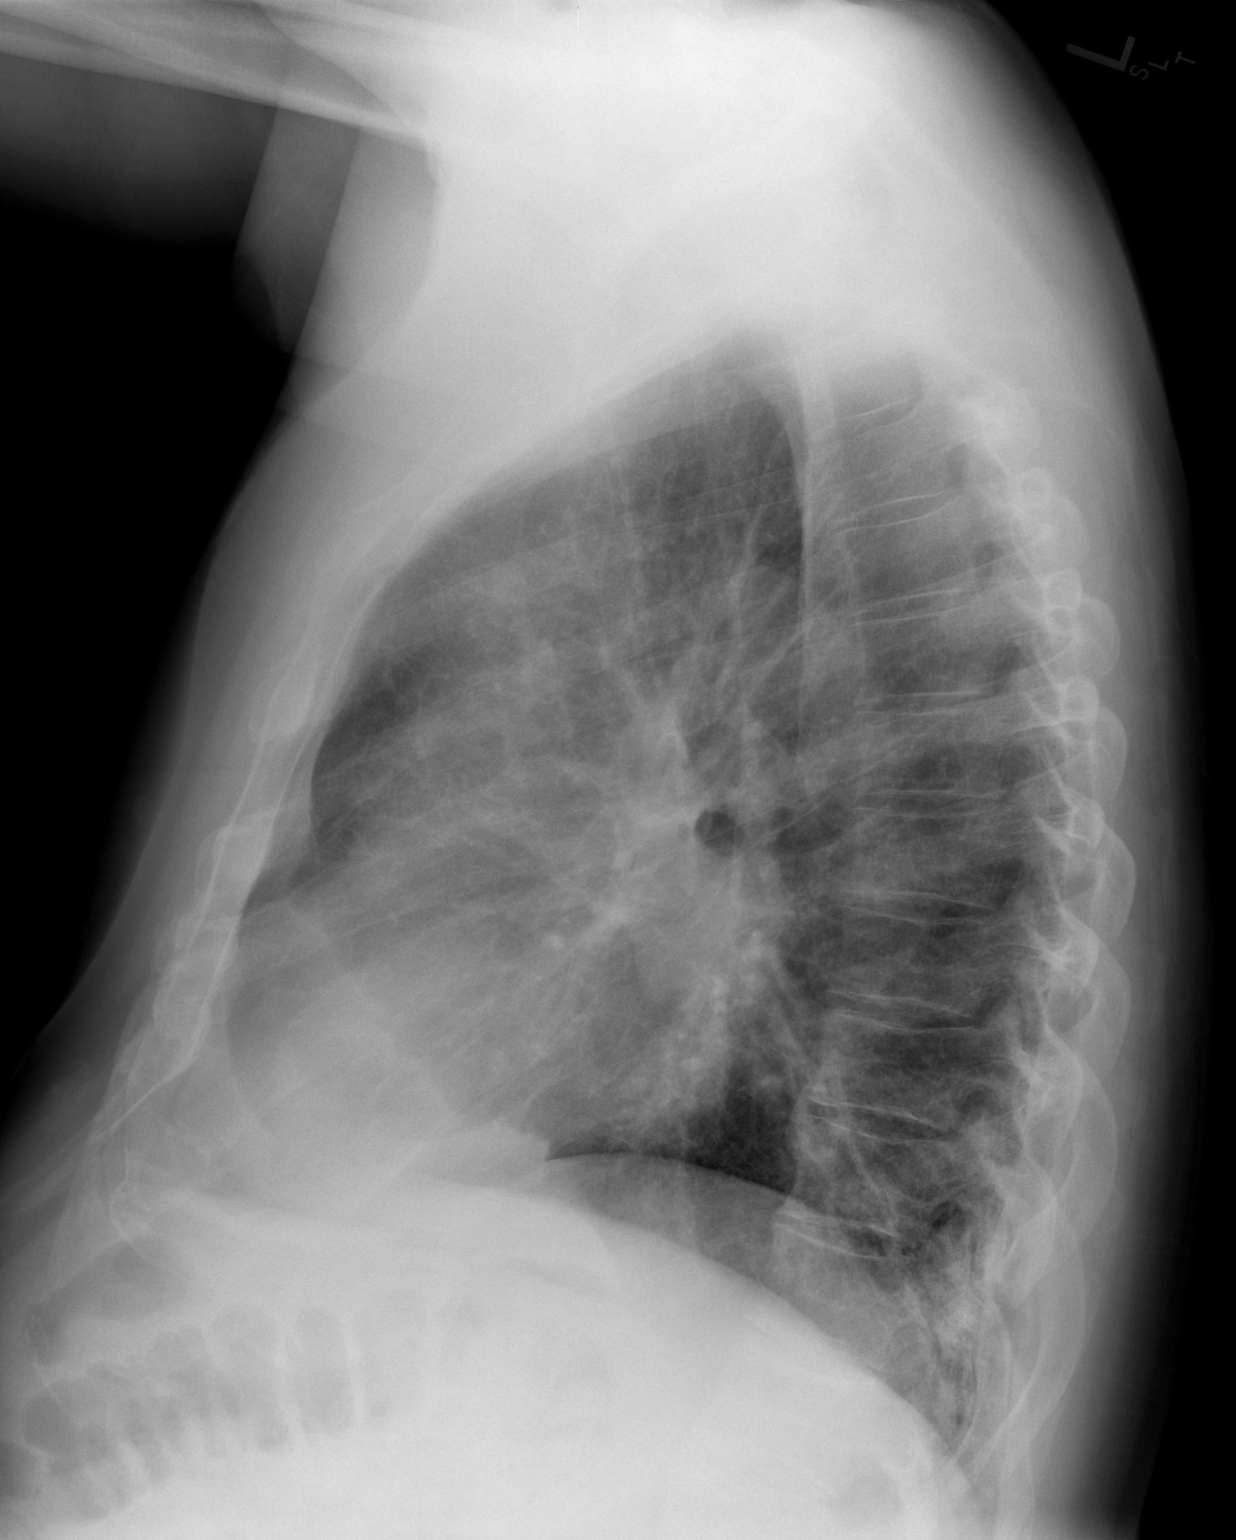

[2 of 2 positions shown; findings below may reference images not displayed]

FINDINGS: Mild cardiomegaly noted.  There is suspected prominence
of the epicardial fat pad as well.  Faint interstitial prominence
in the lung bases appears chronic.

No pleural effusion identified.  No discrete airspace opacity is
noted.
IMPRESSION: 1.  Cardiomegaly.
2.  Faint chronic interstitial prominence in the lung bases,
possibly from prior tobacco use.
3.  No overt edema.

## 2012-02-16 ENCOUNTER — Other Ambulatory Visit (HOSPITAL_COMMUNITY): Payer: Self-pay | Admitting: Cardiology

## 2012-02-16 DIAGNOSIS — I421 Obstructive hypertrophic cardiomyopathy: Secondary | ICD-10-CM

## 2012-02-16 DIAGNOSIS — I4891 Unspecified atrial fibrillation: Secondary | ICD-10-CM

## 2012-02-17 ENCOUNTER — Other Ambulatory Visit (HOSPITAL_COMMUNITY): Payer: Medicare Other | Admitting: Radiology

## 2012-02-17 ENCOUNTER — Ambulatory Visit (HOSPITAL_COMMUNITY): Payer: Medicare Other | Attending: Cardiology

## 2012-02-17 ENCOUNTER — Other Ambulatory Visit: Payer: Self-pay

## 2012-02-17 DIAGNOSIS — I517 Cardiomegaly: Secondary | ICD-10-CM | POA: Insufficient documentation

## 2012-02-17 DIAGNOSIS — I10 Essential (primary) hypertension: Secondary | ICD-10-CM | POA: Insufficient documentation

## 2012-02-17 DIAGNOSIS — I421 Obstructive hypertrophic cardiomyopathy: Secondary | ICD-10-CM | POA: Insufficient documentation

## 2012-02-17 DIAGNOSIS — E669 Obesity, unspecified: Secondary | ICD-10-CM | POA: Insufficient documentation

## 2012-02-17 DIAGNOSIS — I059 Rheumatic mitral valve disease, unspecified: Secondary | ICD-10-CM | POA: Insufficient documentation

## 2012-02-17 DIAGNOSIS — I4891 Unspecified atrial fibrillation: Secondary | ICD-10-CM | POA: Insufficient documentation

## 2012-02-17 DIAGNOSIS — I428 Other cardiomyopathies: Secondary | ICD-10-CM

## 2012-02-17 DIAGNOSIS — Z87891 Personal history of nicotine dependence: Secondary | ICD-10-CM | POA: Insufficient documentation

## 2012-02-19 ENCOUNTER — Ambulatory Visit (INDEPENDENT_AMBULATORY_CARE_PROVIDER_SITE_OTHER): Payer: Medicare Other | Admitting: Cardiology

## 2012-02-19 ENCOUNTER — Ambulatory Visit (INDEPENDENT_AMBULATORY_CARE_PROVIDER_SITE_OTHER): Payer: Medicare Other | Admitting: *Deleted

## 2012-02-19 ENCOUNTER — Encounter: Payer: Self-pay | Admitting: Cardiology

## 2012-02-19 VITALS — BP 138/72 | HR 77 | Ht 71.0 in | Wt 223.1 lb

## 2012-02-19 DIAGNOSIS — E7800 Pure hypercholesterolemia, unspecified: Secondary | ICD-10-CM

## 2012-02-19 DIAGNOSIS — I872 Venous insufficiency (chronic) (peripheral): Secondary | ICD-10-CM

## 2012-02-19 DIAGNOSIS — I421 Obstructive hypertrophic cardiomyopathy: Secondary | ICD-10-CM

## 2012-02-19 DIAGNOSIS — I10 Essential (primary) hypertension: Secondary | ICD-10-CM

## 2012-02-19 DIAGNOSIS — I4891 Unspecified atrial fibrillation: Secondary | ICD-10-CM

## 2012-02-19 DIAGNOSIS — D696 Thrombocytopenia, unspecified: Secondary | ICD-10-CM

## 2012-02-19 LAB — CBC WITH DIFFERENTIAL/PLATELET
Basophils Absolute: 0 10*3/uL (ref 0.0–0.1)
Eosinophils Absolute: 0.2 10*3/uL (ref 0.0–0.7)
Lymphocytes Relative: 29.6 % (ref 12.0–46.0)
MCHC: 33.6 g/dL (ref 30.0–36.0)
Neutrophils Relative %: 57.9 % (ref 43.0–77.0)
RDW: 15 % — ABNORMAL HIGH (ref 11.5–14.6)

## 2012-02-19 LAB — HEPATIC FUNCTION PANEL
AST: 20 U/L (ref 0–37)
Total Bilirubin: 1 mg/dL (ref 0.3–1.2)

## 2012-02-19 LAB — LIPID PANEL
Cholesterol: 125 mg/dL (ref 0–200)
LDL Cholesterol: 65 mg/dL (ref 0–99)
Triglycerides: 97 mg/dL (ref 0.0–149.0)
VLDL: 19.4 mg/dL (ref 0.0–40.0)

## 2012-02-19 LAB — BASIC METABOLIC PANEL
BUN: 23 mg/dL (ref 6–23)
GFR: 68.64 mL/min (ref >60.00)
Potassium: 3.9 mEq/L (ref 3.5–5.1)

## 2012-02-19 NOTE — Patient Instructions (Signed)
Your physician recommends that you schedule a follow-up appointment in: 4 MONTHS  You have been referred to Hematology for Thrombocytopenia (low platelet count).  Your physician recommends that you continue on your current medications as directed. Please refer to the Current Medication list given to you today.

## 2012-02-19 NOTE — Progress Notes (Signed)
HPI:  He really seems to be getting along quite well, and has adjusted to atrial fibrillation.  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  Has no problems doing that.  Not particularly short of breath overall.  Has chronic venous stasis changes which have not gotten appreciably worse recently.  Also reviewed the results of his echo from a couple of days ago.    Current Outpatient Prescriptions  Medication Sig Dispense Refill  . acetaminophen (TYLENOL) 325 MG tablet Take 650 mg by mouth every 6 (six) hours as needed.        . diltiazem (TIAZAC) 360 MG 24 hr capsule TAKE ONE CAPSULE BY MOUTH DAILY  90 capsule  2  . furosemide (LASIX) 20 MG tablet       . metoprolol succinate (TOPROL-XL) 25 MG 24 hr tablet Take 1 tablet (25 mg total) by mouth daily.  30 tablet  6  . potassium chloride SA (KLOR-CON M20) 20 MEQ tablet       . warfarin (COUMADIN) 2 MG tablet TAKE 1 TABLET BY MOUTH AS DIRECTED  45 tablet  3  . DISCONTD: furosemide (LASIX) 20 MG tablet TAKE 2 TABLETS DAILY  60 tablet  5  . DISCONTD: KLOR-CON M20 20 MEQ tablet TAKE 1 TABLET BY MOUTH EVERY DAY  30 tablet  4    No Known Allergies  Past Medical History  Diagnosis Date  . HTN (hypertension)   . Hypertrophic cardiomyopathy   . Persistent atrial fibrillation   . Venous insufficiency     No past surgical history on file.  Family History  Problem Relation Age of Onset  . Colon cancer      History   Social History  . Marital Status: Married    Spouse Name: N/A    Number of Children: N/A  . Years of Education: N/A   Occupational History  . retired    Social History Main Topics  . Smoking status: Former Smoker    Quit date: 10/27/1998  . Smokeless tobacco: Not on file  . Alcohol Use: No  . Drug Use: Not on file  . Sexually Active: Not on file   Other Topics Concern  . Not on file   Social History Narrative  . No narrative on file    ROS: Please see the HPI.  All other  systems reviewed and negative.  PHYSICAL EXAM:  BP 138/72  Pulse 77  Ht 5\' 11"  (1.803 m)  Wt 223 lb 1.9 oz (101.207 kg)  BMI 31.12 kg/m2  General: Well developed, well nourished, in no acute distress. Head:  Normocephalic and atraumatic. Neck: no JVD Lungs: Clear to auscultation and percussion. Heart: irregularly irregular.  Murmur not as prominent since he has had atrial fib.  .  Abdomen:  Normal bowel sounds; soft; non tender; no organomegaly Pulses: Bilateral LE edema, left worse than right.   Extremities: No clubbing or cyanosis. No edema. Neurologic: Alert and oriented x 3.  EKG:  Atrial fibrillation with controlled ventricular response.   Delay in R wave progression.  ECHO 02/17/2012    Study Conclusions  - Left ventricle: The cavity size was normal. There was mild focal basal hypertrophy of the septum. Systolic function was normal. The estimated ejection fraction was in the range of 55% to 60%. Wall motion was normal; there were no regional wall motion abnormalities. - Mitral valve: There was mild systolic anterior motion of the chordal structures. Mild regurgitation. -  Left atrium: The atrium was severely dilated. - Right atrium: The atrium was severely dilated. - Pulmonic valve: Severe regurgitation. - Pulmonary arteries: Systolic pressure was mildly increased. PA peak pressure: 31mm Hg (S). - Pericardium, extracardiac: A trivial pericardial effusion was identified.     ASSESSMENT AND PLAN:

## 2012-02-27 ENCOUNTER — Telehealth: Payer: Self-pay | Admitting: *Deleted

## 2012-02-27 NOTE — Telephone Encounter (Signed)
PATIENT CONFIRMED OVER THE PHONE THE NEW DATE AND TIME PRINTED OUT CALENDAR AND MAILED TO THE PATIENT ON 02-27-2012

## 2012-02-27 NOTE — Telephone Encounter (Signed)
correction prior to the 03-05-2012

## 2012-02-27 NOTE — Telephone Encounter (Signed)
patient confirmed over the phone the new date and time of the new patient appointment mailed out calendar on 02-27-2012

## 2012-03-02 ENCOUNTER — Telehealth: Payer: Self-pay | Admitting: Oncology

## 2012-03-02 NOTE — Telephone Encounter (Signed)
Referred by Dr. Raiford Simmonds Dx- Mild Thrombocytopenia

## 2012-03-03 ENCOUNTER — Encounter: Payer: Self-pay | Admitting: Oncology

## 2012-03-05 ENCOUNTER — Ambulatory Visit: Payer: Medicare Other | Admitting: Oncology

## 2012-03-05 ENCOUNTER — Other Ambulatory Visit: Payer: Medicare Other | Admitting: Lab

## 2012-03-05 ENCOUNTER — Ambulatory Visit: Payer: Medicare Other

## 2012-03-11 DIAGNOSIS — I872 Venous insufficiency (chronic) (peripheral): Secondary | ICD-10-CM | POA: Insufficient documentation

## 2012-03-11 NOTE — Assessment & Plan Note (Signed)
Chronic and mild.  No clinical bleeding.  Refer to heme regarding understanding mechanisms.

## 2012-03-11 NOTE — Assessment & Plan Note (Signed)
Overall LV remains preserved.  Has basal hypertrophy, and has had classic HOCM exam in the past.  Less noticeable now with atrial fib.

## 2012-03-11 NOTE — Assessment & Plan Note (Signed)
Left worse than right.  Remains on warfarin for atrial fib.  Tolerates diuretics.

## 2012-03-11 NOTE — Assessment & Plan Note (Signed)
Has failed ablation, and is now with rate controlled atrial fib.  Remains on warfarin, and probably ideal given his venous disease as well.

## 2012-03-12 ENCOUNTER — Ambulatory Visit (INDEPENDENT_AMBULATORY_CARE_PROVIDER_SITE_OTHER): Payer: Medicare Other

## 2012-03-12 DIAGNOSIS — I4891 Unspecified atrial fibrillation: Secondary | ICD-10-CM

## 2012-03-12 DIAGNOSIS — I4892 Unspecified atrial flutter: Secondary | ICD-10-CM

## 2012-03-12 LAB — POCT INR: INR: 2.8

## 2012-03-18 ENCOUNTER — Other Ambulatory Visit: Payer: Self-pay | Admitting: Oncology

## 2012-04-22 ENCOUNTER — Ambulatory Visit (INDEPENDENT_AMBULATORY_CARE_PROVIDER_SITE_OTHER): Payer: Medicare Other | Admitting: *Deleted

## 2012-04-22 DIAGNOSIS — I4891 Unspecified atrial fibrillation: Secondary | ICD-10-CM

## 2012-04-22 DIAGNOSIS — I4892 Unspecified atrial flutter: Secondary | ICD-10-CM

## 2012-04-22 LAB — POCT INR: INR: 2.4

## 2012-05-13 ENCOUNTER — Telehealth: Payer: Self-pay | Admitting: Cardiology

## 2012-05-13 NOTE — Telephone Encounter (Signed)
I spoke with the pt and he is very anxious about a rash that developed yesterday on his upper body, thighs and waist.  The pt said he went to work and started having itching in his hands.  A rash developed and this spread up his arms and to his waist and thighs.  The pt left work and went home and got on the computer to determine what was happening.  The pt feels like he had hives.  The pt washed his body in antibacterial soap and his rash is improved today.  The pt denies any symptoms of difficulty breathing with episode.  I made the pt aware that he may have contact dermatitis.  The pt did come into contact with a powder that was located on his car battery when the car would not crank prior to work.  I made the pt aware that if this occurs again he can take Benadryl or Claritin (no antihistamines with decongestant) and hydrocortisone cream.  Pt agreed with plan and will call the office with any other questions or concerns.

## 2012-05-13 NOTE — Telephone Encounter (Signed)
New msg Pt has hives and he doesn't know what is causing it. Please call to discuss

## 2012-06-03 ENCOUNTER — Ambulatory Visit (INDEPENDENT_AMBULATORY_CARE_PROVIDER_SITE_OTHER): Payer: Medicare Other | Admitting: Pharmacist

## 2012-06-03 DIAGNOSIS — I4892 Unspecified atrial flutter: Secondary | ICD-10-CM

## 2012-06-03 DIAGNOSIS — I4891 Unspecified atrial fibrillation: Secondary | ICD-10-CM

## 2012-06-03 LAB — POCT INR: INR: 2.7

## 2012-06-16 ENCOUNTER — Encounter: Payer: Self-pay | Admitting: Cardiology

## 2012-06-16 ENCOUNTER — Ambulatory Visit (INDEPENDENT_AMBULATORY_CARE_PROVIDER_SITE_OTHER): Payer: Medicare Other | Admitting: Cardiology

## 2012-06-16 VITALS — BP 126/75 | HR 81 | Ht 71.0 in | Wt 210.1 lb

## 2012-06-16 DIAGNOSIS — I4891 Unspecified atrial fibrillation: Secondary | ICD-10-CM

## 2012-06-16 DIAGNOSIS — I421 Obstructive hypertrophic cardiomyopathy: Secondary | ICD-10-CM

## 2012-06-16 LAB — BASIC METABOLIC PANEL WITH GFR
BUN: 29 mg/dL — ABNORMAL HIGH (ref 6–23)
CO2: 30 meq/L (ref 19–32)
Calcium: 8.8 mg/dL (ref 8.4–10.5)
Chloride: 102 meq/L (ref 96–112)
Creatinine, Ser: 1.1 mg/dL (ref 0.4–1.5)
GFR: 67.88 mL/min
Glucose, Bld: 90 mg/dL (ref 70–99)
Potassium: 3.6 meq/L (ref 3.5–5.1)
Sodium: 139 meq/L (ref 135–145)

## 2012-06-16 LAB — TSH: TSH: 1.1 u[IU]/mL (ref 0.35–5.50)

## 2012-06-16 LAB — MAGNESIUM: Magnesium: 2.3 mg/dL (ref 1.5–2.5)

## 2012-06-16 NOTE — Progress Notes (Signed)
   HPI:  The patient is in for followup. 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 The patient is very diligent and obviously hard-working. He's had a recent knee injection is also notes some cramping in his feet. He is also noted some weight loss, which to some degree has been unexplained. Otherwise, he is stable  Current Outpatient Prescriptions  Medication Sig Dispense Refill  . acetaminophen (TYLENOL) 325 MG tablet Take 650 mg by mouth every 6 (six) hours as needed.        . diltiazem (TIAZAC) 360 MG 24 hr capsule TAKE ONE CAPSULE BY MOUTH DAILY  90 capsule  2  . furosemide (LASIX) 20 MG tablet Take 20 mg by mouth daily.       . metoprolol succinate (TOPROL-XL) 25 MG 24 hr tablet Take 1 tablet (25 mg total) by mouth daily.  30 tablet  6  . potassium chloride SA (KLOR-CON M20) 20 MEQ tablet Take 10 mEq by mouth daily.       [name] warfarin (COUMADIN) 2 MG tablet TAKE 1 TABLET BY MOUTH AS DIRECTED  45 tablet  3    No Known Allergies  Past Medical History  Diagnosis Date  . HTN (hypertension)   . Hypertrophic cardiomyopathy   . Persistent atrial fibrillation   . Venous insufficiency   . Thrombocytopenia     No past surgical history on file.  Family History  Problem Relation Age of Onset  . Colon cancer      History   Social History  . Marital Status: Married    Spouse Name: N/A    Number of Children: N/A  . Years of Education: N/A   Occupational History  . retired    Social History Main Topics  . Smoking status: Former Smoker    Quit date: 10/27/1998  . Smokeless tobacco: Not on file  . Alcohol Use: No  . Drug Use: Not on file  . Sexually Active: Not on file   Other Topics Concern  . Not on file   Social History Narrative  . No narrative on file    ROS: Please see the HPI.  All other systems reviewed and negative.  PHYSICAL EXAM:  BP 126/75  Pulse 81  Ht 5'  11" (1.803 m)  Wt 210 lb 1.9 oz (95.31 kg)  BMI 29.31 kg/m2  General: Well developed, well nourished, in no acute distress. Head:  Normocephalic and atraumatic. Neck: no JVD Lungs: Clear to auscultation and percussion. Heart: irregularly irregular rhythm.  SEM 2/6.  I do not appreciate a diastolic blow.   Abdomen:  Normal bowel sounds; soft; non tender; no organomegaly Pulses: Pulses normal in all 4 extremities. Extremities:  Diffuse chronic venous stasis changes.   Neurologic: Alert and oriented x 3.  EKG:  Atrial fib with controlled ventricular response.  Low voltage QRS.  Nonspecific ST and T changes.    ASSESSMENT AND PLAN:  1.  hypertrophic cardiomyopathy-he remained stable from the standpoint 2. Atrial fibrillation-his rate remains well controlled, and is tolerating this reasonably well. 3. Thrombocytopenia  --  We will refer him to hematology for a visit to review his chronic low platelet counts.    Labs will be obtained today. We will review his 2 most recent echocardiograms for comparison. Followup will be in 3 months.

## 2012-06-16 NOTE — Patient Instructions (Addendum)
Your physician recommends that you schedule a follow-up appointment in: 3 MONTHS with Dr Riley Kill  Your physician recommends that you have lab work today: BMP, Magnesium and TSH  Please schedule appointment with Hematology (order was placed in system in May).  Anithistamines that you can take: Claritin, Zyrtec, Benadryl

## 2012-06-17 ENCOUNTER — Telehealth: Payer: Self-pay | Admitting: Oncology

## 2012-06-17 NOTE — Telephone Encounter (Signed)
S/W pt re NP appt 9/4 @ 1:30 w. Dr. Clelia Croft. Referring Dr. Shawnie Pons Dx- Thrombocytopenia NP packet mailed out.

## 2012-06-17 NOTE — Telephone Encounter (Signed)
C/D on 8/22 for Appt. On 9/4

## 2012-06-18 ENCOUNTER — Telehealth: Payer: Self-pay | Admitting: *Deleted

## 2012-06-18 ENCOUNTER — Ambulatory Visit (INDEPENDENT_AMBULATORY_CARE_PROVIDER_SITE_OTHER)
Admission: RE | Admit: 2012-06-18 | Discharge: 2012-06-18 | Disposition: A | Discharge status: Home / Self Care | Payer: Medicare Other | Source: Ambulatory Visit | Attending: Cardiology | Admitting: Cardiology

## 2012-06-18 ENCOUNTER — Encounter: Payer: Self-pay | Admitting: Cardiology

## 2012-06-18 ENCOUNTER — Encounter: Payer: Self-pay | Admitting: *Deleted

## 2012-06-18 DIAGNOSIS — R634 Abnormal weight loss: Secondary | ICD-10-CM

## 2012-06-18 DIAGNOSIS — Z87891 Personal history of nicotine dependence: Secondary | ICD-10-CM

## 2012-06-18 NOTE — Telephone Encounter (Signed)
pt notified of lab results and is having cxr today @ [facility] on [address].

## 2012-06-18 NOTE — Telephone Encounter (Signed)
Pt did x-ray and would and would like a call back asap because he is kind of anxious to hear about it

## 2012-06-18 NOTE — Telephone Encounter (Signed)
Message copied by Tarri Fuller on Fri Jun 18, 2012  8:40 AM ------      Message from: Shawnie Pons D      Created: Fri Jun 18, 2012  6:45 AM       Let Bradley Winters know that his labs look good.  He should have a CXR because of his weight loss and prior smoking history.  Thanks.  TS

## 2012-06-18 NOTE — Telephone Encounter (Signed)
This encounter was created in error - please disregard.

## 2012-06-30 ENCOUNTER — Ambulatory Visit: Payer: Medicare Other

## 2012-06-30 ENCOUNTER — Telehealth: Payer: Self-pay | Admitting: Cardiology

## 2012-06-30 ENCOUNTER — Ambulatory Visit (HOSPITAL_BASED_OUTPATIENT_CLINIC_OR_DEPARTMENT_OTHER): Payer: Medicare Other | Admitting: Oncology

## 2012-06-30 ENCOUNTER — Other Ambulatory Visit (HOSPITAL_BASED_OUTPATIENT_CLINIC_OR_DEPARTMENT_OTHER): Payer: Medicare Other | Admitting: Lab

## 2012-06-30 VITALS — BP 129/61 | HR 85 | Temp 97.2°F | Resp 20 | Ht 71.0 in | Wt 212.2 lb

## 2012-06-30 DIAGNOSIS — I4892 Unspecified atrial flutter: Secondary | ICD-10-CM

## 2012-06-30 DIAGNOSIS — D696 Thrombocytopenia, unspecified: Secondary | ICD-10-CM

## 2012-06-30 DIAGNOSIS — I4891 Unspecified atrial fibrillation: Secondary | ICD-10-CM

## 2012-06-30 LAB — MORPHOLOGY: PLT EST: DECREASED

## 2012-06-30 LAB — CBC WITH DIFFERENTIAL/PLATELET
Basophils Absolute: 0 10*3/uL (ref 0.0–0.1)
Eosinophils Absolute: 0.2 10*3/uL (ref 0.0–0.5)
HCT: 45.6 % (ref 38.4–49.9)
HGB: 15.5 g/dL (ref 13.0–17.1)
MCH: 31 pg (ref 27.2–33.4)
MONO#: 0.6 10*3/uL (ref 0.1–0.9)
NEUT#: 5.8 10*3/uL (ref 1.5–6.5)
NEUT%: 65.8 % (ref 39.0–75.0)
RDW: 15.2 % — ABNORMAL HIGH (ref 11.0–14.6)
WBC: 8.7 10*3/uL (ref 4.0–10.3)
lymph#: 2.1 10*3/uL (ref 0.9–3.3)

## 2012-06-30 LAB — CHCC SMEAR

## 2012-06-30 NOTE — Progress Notes (Signed)
Note dictated

## 2012-06-30 NOTE — Telephone Encounter (Signed)
Please return call to patient [phone],   plz  Call patient tomorrow after you receive the results of his lab test to discuss and advise as he is a little bit anxious to know.

## 2012-06-30 NOTE — Progress Notes (Signed)
CC:   Arturo Morton. Riley Kill, MD, [facility]  REASON FOR CONSULTATION:  Thrombocytopenia.  HISTORY OF PRESENT ILLNESS:  [name] is a pleasant 71 year old gentleman, native of [place], currently of [place].  Currently retired, worked in Medical laboratory scientific officer.  Also did not serve in the [employer].  This is a gentleman with a past medical history significant for cardiomyopathy and atrial fibrillation followed by Dr. Riley Kill for his cardiac condition.  He was noted to have mild thrombocytopenia.  His most recent CBC back in April of 2013 showed a platelet count of 136, lower limit of normal about 150.  He had normal white cell count, normal hemoglobin, normal differential.  Historically, his platelet counts have fluctuated as low as 125 back in 2011 and as high as 158 back in December of 2012.  Clinically, the patient is asymptomatic.  He had not reported any bleeding.  He had not reported any epistaxis.  He had not report any ecchymosis.  For the most part, he is asymptomatic at this time.  REVIEW OF SYSTEMS:  He does not report any headaches, blurry vision, double vision.  He does not report any motor or sensory neuropathy.  He does not report any alteration in mental status.  He does not report any psychiatric issues, depression.  He does not report any fever, chills, sweats.  He does not report any cough, hemoptysis, hematemesis.  No nausea, vomiting.  He does not report any abdominal pain.  No hematochezia or melena.  Rest of review of systems unremarkable.  PAST MEDICAL HISTORY:  Significant for hypertension, hypertrophic cardiomyopathy, atrial fibrillation, and mild thrombocytopenia.  MEDICATION:  He is on Tylenol, diltiazem, Lasix, metoprolol, potassium and Coumadin.  He denied any supplements or over-the-counter medication or any unsanctioned medication at this time.  ALLERGIES:  None.  SOCIAL HISTORY:  He is married.  He has 4 children.  Denied any alcohol or tobacco use at this  time.  FAMILY HISTORY:  His mother died of colon cancer.  He had 1 brother who had colon cancer.  Father had cardiac complications.  PHYSICAL EXAM:  General:  Alert, awake gentleman, appeared in no active distress.  Vital signs:  His blood pressure is 129/61, pulse 85, respirations 20, temperature is 97.2.  HEENT:  Head is normocephalic, atraumatic.  Pupils equal, round, reactive to light.  Oral mucosa moist and pink.  Neck:  Supple without lymphadenopathy.  Heart:  Regular rate and rhythm.  S1, S2.  Lungs:  Clear to auscultation.  No rhonchi, wheeze, dullness to percussion.  Abdomen:  Soft, nontender.  No hepatosplenomegaly.  Extremities:  No clubbing, cyanosis, or edema. Neurological:  Intact motor, sensory and deep tendon reflexes.  LABORATORY DATA:  Today showed a hemoglobin of 15.5, white cell count of 8.7, platelet count of 139, and the count is confirmed by citrate-base tube at this time.  His differential was otherwise all normal. Peripheral smear was personally reviewed today and did not really show any evidence of platelet clumping.  I could not really see any evidence of schistocytosis or red cell fragmentation, no evidence of any dysplasia.  ASSESSMENT AND PLAN:  This is a very pleasant gentleman with the following issues: Mild thrombocytopenia.  The differential diagnosis discussed today in detail.  His platelet counts are very close to being normal.  He has thrombocytopenia dating back to at least 2008 which makes me think this is probably of a benign etiology.  Conditions such as medication, platelet clumping, mild sequestration are probably the causes  of his thrombocytopenia.  I doubt there is a malignant condition here.  This does not look like HUS, DIC, TTP or evidence of any myelodysplastic syndrome or myeloproliferative disorder or lymphoproliferative disorder. At this time, I do not see that any further hematological workup is needed.  He does not really need a  bone marrow biopsy and really is very low to no risk of bleeding at this time.  Mr. Wilkinson is very anxious and really very nervous about being here.  I told him that I will be happy to see him in the future as needed.  If his platelet counts drop below 100,000, we will re-evaluate him and he has my contact information and, again, it was pleasure meeting him today and I will be happy to see him in the future anytime needed.    ______________________________ Benjiman Core, M.D. FNS/MEDQ  D:  06/30/2012  T:  06/30/2012  Job:  [id number]

## 2012-07-01 NOTE — Telephone Encounter (Signed)
I spoke with the pt and reviewed his office visit from Dr Clelia Croft.  The pt was very nervous about this visit and the labs that were checked.  I reassured the pt that he will only need to see hematology on an as needed basis.  I also made the pt aware that his platelet count was 139 and this is stable for him.

## 2012-07-04 NOTE — Progress Notes (Signed)
No show.  Follow up prn.  

## 2012-07-15 ENCOUNTER — Ambulatory Visit (INDEPENDENT_AMBULATORY_CARE_PROVIDER_SITE_OTHER): Payer: Medicare Other

## 2012-07-15 DIAGNOSIS — I4891 Unspecified atrial fibrillation: Secondary | ICD-10-CM

## 2012-07-15 DIAGNOSIS — I4892 Unspecified atrial flutter: Secondary | ICD-10-CM

## 2012-07-15 LAB — POCT INR: INR: 3

## 2012-07-25 ENCOUNTER — Other Ambulatory Visit: Payer: Self-pay | Admitting: Cardiology

## 2012-08-26 ENCOUNTER — Ambulatory Visit (INDEPENDENT_AMBULATORY_CARE_PROVIDER_SITE_OTHER): Payer: Medicare Other | Admitting: Pharmacist

## 2012-08-26 DIAGNOSIS — I4892 Unspecified atrial flutter: Secondary | ICD-10-CM

## 2012-08-26 DIAGNOSIS — I4891 Unspecified atrial fibrillation: Secondary | ICD-10-CM

## 2012-08-26 LAB — POCT INR: INR: 2.2

## 2012-09-14 ENCOUNTER — Ambulatory Visit (INDEPENDENT_AMBULATORY_CARE_PROVIDER_SITE_OTHER): Payer: Medicare Other | Admitting: Cardiology

## 2012-09-14 ENCOUNTER — Encounter: Payer: Self-pay | Admitting: Cardiology

## 2012-09-14 VITALS — BP 118/70 | HR 72 | Ht 71.0 in | Wt 220.0 lb

## 2012-09-14 DIAGNOSIS — I421 Obstructive hypertrophic cardiomyopathy: Secondary | ICD-10-CM

## 2012-09-14 DIAGNOSIS — I4891 Unspecified atrial fibrillation: Secondary | ICD-10-CM

## 2012-09-14 NOTE — Assessment & Plan Note (Signed)
Patient has SAM, typical exam.  Long standing.  When seen next will discuss genetic testing.  Continue to follow.

## 2012-09-14 NOTE — Assessment & Plan Note (Signed)
Rate is well controlled.  Continue medical therapy.

## 2012-09-14 NOTE — Patient Instructions (Addendum)
Your physician recommends that you schedule a follow-up appointment in: February 2014  Your physician recommends that you continue on your current medications as directed. Please refer to the Current Medication list given to you today.  

## 2012-09-14 NOTE — Progress Notes (Signed)
   HPI:  The patient is in for a followup visit. From a cardiac standpoint he has been stable. 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  He has had swelling is been more prominent in the right lower extremity, but he said this improved actually with cortisone shots to the knee. He's had recurring problems with his right knee. He denies any other major symptoms. He is tolerating his atrial fibrillation rate.  Current Outpatient Prescriptions  Medication Sig Dispense Refill  . acetaminophen (TYLENOL) 325 MG tablet Take 650 mg by mouth every 6 (six) hours as needed.        . diltiazem (TIAZAC) 360 MG 24 hr capsule TAKE ONE CAPSULE BY MOUTH DAILY  90 capsule  2  . furosemide (LASIX) 20 MG tablet Take 20 mg by mouth daily.       . metoprolol succinate (TOPROL-XL) 25 MG 24 hr tablet TAKE 1 TABLET EVERY DAY  30 tablet  9  . potassium chloride SA (KLOR-CON M20) 20 MEQ tablet Take 10 mEq by mouth daily.       [name] warfarin (COUMADIN) 2 MG tablet TAKE 1 TABLET BY MOUTH AS DIRECTED  45 tablet  3    No Known Allergies  Past Medical History  Diagnosis Date  . HTN (hypertension)   . Hypertrophic cardiomyopathy   . Persistent atrial fibrillation   . Venous insufficiency   . Thrombocytopenia     No past surgical history on file.  Family History  Problem Relation Age of Onset  . Colon cancer      History   Social History  . Marital Status: Married    Spouse Name: N/A    Number of Children: N/A  . Years of Education: N/A   Occupational History  . retired    Social History Main Topics  . Smoking status: Former Smoker    Quit date: 10/27/1998  . Smokeless tobacco: Not on file  . Alcohol Use: No  . Drug Use: Not on file  . Sexually Active: Not on file   Other Topics Concern  . Not on file   Social History Narrative  . No narrative on file    ROS: Please see the HPI.  All other systems reviewed and negative.  PHYSICAL EXAM:  BP  118/70  Pulse 72  Ht 5\' 11"  (1.803 m)  Wt 220 lb (99.791 kg)  BMI 30.68 kg/m2  SpO2 95%  General: Well developed, well nourished, in no acute distress. Head:  Normocephalic and atraumatic. Neck: no JVD Lungs: Clear to auscultation and percussion. Heart: irregularly irregular with minimal murmur at this point in time.   Abdomen:  Normal bowel sounds; soft; non tender; no organomegaly Pulses: Pulses normal in all 4 extremities. Extremities: RLE is 3 plus, with chronic venous stasis changes.   Neurologic: Alert and oriented x 3.  EKG:  Atrial fibrillation with controlled ventricular response.  Low voltage QRS.  Delay in R wave progression.    ASSESSMENT AND PLAN:

## 2012-09-16 ENCOUNTER — Telehealth: Payer: Self-pay | Admitting: Cardiology

## 2012-09-16 MED ORDER — POTASSIUM CHLORIDE CRYS ER 20 MEQ PO TBCR: 10.0000 meq | EXTENDED_RELEASE_TABLET | Freq: Every day | ORAL | Status: DC

## 2012-09-16 MED ORDER — FUROSEMIDE 20 MG PO TABS: 20.0000 mg | ORAL_TABLET | Freq: Every day | ORAL | Status: DC

## 2012-09-16 NOTE — Telephone Encounter (Signed)
Pt needs refill of potassium and furosimide , [phone]

## 2012-09-16 NOTE — Telephone Encounter (Signed)
[facility], and called pt to inform him.  Pt is to call back before 2 if his medications had not been received.  Caralee Ates, CMA

## 2012-10-07 ENCOUNTER — Ambulatory Visit (INDEPENDENT_AMBULATORY_CARE_PROVIDER_SITE_OTHER): Payer: Medicare Other | Admitting: *Deleted

## 2012-10-07 DIAGNOSIS — I4892 Unspecified atrial flutter: Secondary | ICD-10-CM

## 2012-10-07 DIAGNOSIS — I4891 Unspecified atrial fibrillation: Secondary | ICD-10-CM

## 2012-10-07 LAB — POCT INR: INR: 2.2

## 2012-10-25 ENCOUNTER — Other Ambulatory Visit: Payer: Self-pay | Admitting: *Deleted

## 2012-10-25 MED ORDER — DILTIAZEM HCL ER BEADS 360 MG PO CP24: 360.0000 mg | ORAL_CAPSULE | Freq: Every day | ORAL | Status: DC

## 2012-11-18 ENCOUNTER — Ambulatory Visit (INDEPENDENT_AMBULATORY_CARE_PROVIDER_SITE_OTHER): Payer: Medicare Other

## 2012-11-18 DIAGNOSIS — I4891 Unspecified atrial fibrillation: Secondary | ICD-10-CM

## 2012-11-18 DIAGNOSIS — I4892 Unspecified atrial flutter: Secondary | ICD-10-CM

## 2012-11-18 LAB — POCT INR: INR: 2.5

## 2012-11-23 ENCOUNTER — Encounter: Payer: Self-pay | Admitting: Cardiology

## 2012-11-23 NOTE — Telephone Encounter (Signed)
New Problem    Pt missed medications (diltiazem, coumadin,furosemide, potassium chloride), last time he took it was around 10 AM yesterday. Would like to speak to nurse.

## 2012-11-23 NOTE — Telephone Encounter (Signed)
This encounter was created in error - please disregard.

## 2012-12-02 ENCOUNTER — Ambulatory Visit (INDEPENDENT_AMBULATORY_CARE_PROVIDER_SITE_OTHER): Payer: Medicare Other | Admitting: Cardiology

## 2012-12-02 ENCOUNTER — Encounter: Payer: Self-pay | Admitting: Cardiology

## 2012-12-02 VITALS — BP 116/64 | HR 68 | Ht 71.0 in | Wt 230.0 lb

## 2012-12-02 DIAGNOSIS — I4891 Unspecified atrial fibrillation: Secondary | ICD-10-CM

## 2012-12-02 DIAGNOSIS — D696 Thrombocytopenia, unspecified: Secondary | ICD-10-CM

## 2012-12-02 DIAGNOSIS — R259 Unspecified abnormal involuntary movements: Secondary | ICD-10-CM

## 2012-12-02 DIAGNOSIS — I872 Venous insufficiency (chronic) (peripheral): Secondary | ICD-10-CM

## 2012-12-02 DIAGNOSIS — I421 Obstructive hypertrophic cardiomyopathy: Secondary | ICD-10-CM

## 2012-12-02 NOTE — Patient Instructions (Addendum)
Your physician recommends that you return for lab work: BMP and Genetic testing  Your physician wants you to follow-up in: 6 MONTHS with Dr Excell Seltzer.  You will receive a reminder letter in the mail two months in advance. If you don't receive a letter, please call our office to schedule the follow-up appointment.  Your physician recommends that you continue on your current medications as directed. Please refer to the Current Medication list given to you today.

## 2012-12-02 NOTE — Progress Notes (Signed)
HPI:  Patient returns in followup today. Overall he is getting along well. He has no specific complaints. He feels good and we had a very nice conversation. He does continue to have some swelling of the lower extremities, characterized by long-standing venous stasis changes. Otherwise, he is doing well. The patient's had prior bilateral thymectomy. We also discussed obtaining genetic testing today.  Current Outpatient Prescriptions  Medication Sig Dispense Refill  . acetaminophen (TYLENOL) 325 MG tablet Take 650 mg by mouth every 6 (six) hours as needed.        . diltiazem (TIAZAC) 360 MG 24 hr capsule Take 1 capsule (360 mg total) by mouth daily.  90 capsule  2  . furosemide (LASIX) 20 MG tablet Take 1 tablet (20 mg total) by mouth daily.  30 tablet  5  . metoprolol succinate (TOPROL-XL) 25 MG 24 hr tablet TAKE 1 TABLET EVERY DAY  30 tablet  9  . potassium chloride SA (KLOR-CON M20) 20 MEQ tablet Take 0.5 tablets (10 mEq total) by mouth daily.  30 tablet  5  . warfarin (COUMADIN) 2 MG tablet TAKE 1 TABLET BY MOUTH AS DIRECTED  45 tablet  3    No Known Allergies  Past Medical History  Diagnosis Date  . HTN (hypertension)   . Hypertrophic cardiomyopathy   . Persistent atrial fibrillation   . Venous insufficiency   . Thrombocytopenia     No past surgical history on file.  Family History  Problem Relation Age of Onset  . Colon cancer      History   Social History  . Marital Status: Married    Spouse Name: N/A    Number of Children: N/A  . Years of Education: N/A   Occupational History  . retired    Social History Main Topics  . Smoking status: Former Smoker    Quit date: 10/27/1998  . Smokeless tobacco: Not on file  . Alcohol Use: No  . Drug Use: Not on file  . Sexually Active: Not on file   Other Topics Concern  . Not on file   Social History Narrative  . No narrative on file    ROS: Please see the HPI.  All other systems reviewed and negative.  PHYSICAL  EXAM:  BP 116/64  Pulse 68  Ht 5\' 11"  (1.803 m)  Wt 230 lb (104.327 kg)  BMI 32.08 kg/m2  SpO2 97%  General: Well developed, well nourished, in no acute distress. Head:  Normocephalic and atraumatic. Neck: no JVD Lungs: Clear to auscultation and percussion. Heart: irregularly irregular rhtyhm.  Apical systolic murmur.   Abdomen:  Normal bowel sounds; soft; non tender; no organomegaly Pulses: Pulses normal in all 4 extremities. Extremities: Moderate edema of RLE with stasis changes R greater than L.   Neurologic: Alert and oriented x 3.  EKG:  Atrial fib with controlled ventricular response.  PVC.  Nonspecific T changes.  Delay in R wave, cannot exclude ASMI, old.  (no clinical history or WMA)  ECHO 2013  Study Conclusions  - Left ventricle: The cavity size was normal. There was mild focal basal hypertrophy of the septum. Systolic function was normal. The estimated ejection fraction was in the range of 55% to 60%. Wall motion was normal; there were no regional wall motion abnormalities. - Mitral valve: There was mild systolic anterior motion of the chordal structures. Mild regurgitation. - Left atrium: The atrium was severely dilated. - Right atrium: The atrium was severely dilated. - Pulmonary  arteries: Systolic pressure was mildly increased. PA peak pressure: 31mm Hg (S). - Pericardium, extracardiac: A trivial pericardial effusion was identified.     ASSESSMENT AND PLAN:  I will have him follow up with Dr. Excell Seltzer as his long term will be best served by nursing/MD continuity.

## 2012-12-03 ENCOUNTER — Other Ambulatory Visit (INDEPENDENT_AMBULATORY_CARE_PROVIDER_SITE_OTHER): Payer: Medicare Other

## 2012-12-03 DIAGNOSIS — I4891 Unspecified atrial fibrillation: Secondary | ICD-10-CM

## 2012-12-03 LAB — BASIC METABOLIC PANEL
BUN: 23 mg/dL (ref 6–23)
Creatinine, Ser: 1.2 mg/dL (ref 0.4–1.5)
GFR: 62.05 mL/min (ref >60.00)
Potassium: 3.8 mEq/L (ref 3.5–5.1)

## 2012-12-09 NOTE — Assessment & Plan Note (Signed)
See note of hematology.

## 2012-12-09 NOTE — Assessment & Plan Note (Signed)
Has had bilateral venectomy.  Has seen Dr. Arbie Cookey.  Currently stable.

## 2012-12-09 NOTE — Assessment & Plan Note (Signed)
Rate is nicely controlled.  Has failed ablation.  Continue present treatment.  On combo dilt/metoprolol.

## 2012-12-09 NOTE — Assessment & Plan Note (Signed)
We have discussed this over the years.  He had characteristic exam prior to atrial fibrillation development.  I would favor genetic testing.  We will put this in motion so his son gets tested.

## 2012-12-09 NOTE — Assessment & Plan Note (Signed)
Resolved with dc of amiodarone.

## 2012-12-30 ENCOUNTER — Ambulatory Visit (INDEPENDENT_AMBULATORY_CARE_PROVIDER_SITE_OTHER): Payer: Medicare Other

## 2012-12-30 DIAGNOSIS — I4892 Unspecified atrial flutter: Secondary | ICD-10-CM

## 2012-12-30 DIAGNOSIS — I4891 Unspecified atrial fibrillation: Secondary | ICD-10-CM

## 2012-12-30 LAB — POCT INR: INR: 2.4

## 2013-01-24 ENCOUNTER — Other Ambulatory Visit: Payer: Self-pay | Admitting: Pharmacist

## 2013-01-24 MED ORDER — WARFARIN SODIUM 2 MG PO TABS: ORAL_TABLET | ORAL | Status: DC

## 2013-02-02 ENCOUNTER — Telehealth: Payer: Self-pay | Admitting: Cardiology

## 2013-02-02 NOTE — Telephone Encounter (Signed)
New problem    Pt has recv'd notification from [facility] wants to know about the results

## 2013-02-02 NOTE — Telephone Encounter (Signed)
Will forward to Lauren to report results.

## 2013-02-04 NOTE — Telephone Encounter (Signed)
I spoke with the pt and made him aware that we have not received the results of his study at this time from [facility]. We will contact the pt once we obtain results.

## 2013-02-10 ENCOUNTER — Ambulatory Visit (INDEPENDENT_AMBULATORY_CARE_PROVIDER_SITE_OTHER): Payer: Medicare Other

## 2013-02-10 DIAGNOSIS — I4891 Unspecified atrial fibrillation: Secondary | ICD-10-CM

## 2013-02-10 DIAGNOSIS — I4892 Unspecified atrial flutter: Secondary | ICD-10-CM

## 2013-02-10 NOTE — Telephone Encounter (Signed)
I contacted [facility] and the pt's blood work evaluation is still in process.  Customer Service states that they do not send out letters to patients and is unsure what the pt received in the mail. I spoke with the pt and made him aware of this information.

## 2013-03-17 ENCOUNTER — Other Ambulatory Visit: Payer: Self-pay | Admitting: Cardiovascular Disease

## 2013-03-24 ENCOUNTER — Ambulatory Visit (INDEPENDENT_AMBULATORY_CARE_PROVIDER_SITE_OTHER): Payer: Medicare Other | Admitting: *Deleted

## 2013-03-24 DIAGNOSIS — I4891 Unspecified atrial fibrillation: Secondary | ICD-10-CM

## 2013-03-24 DIAGNOSIS — I4892 Unspecified atrial flutter: Secondary | ICD-10-CM

## 2013-04-01 ENCOUNTER — Telehealth: Payer: Self-pay | Admitting: Cardiology

## 2013-04-01 NOTE — Telephone Encounter (Signed)
I had called [facility] regarding his genetic testing.  He has a gene mutation consistent with HCM.  I suggested to him that he have his family tested.  FU here will be with Dr. Excell Seltzer.

## 2013-05-05 ENCOUNTER — Ambulatory Visit (INDEPENDENT_AMBULATORY_CARE_PROVIDER_SITE_OTHER): Payer: Medicare Other | Admitting: *Deleted

## 2013-05-05 DIAGNOSIS — I4892 Unspecified atrial flutter: Secondary | ICD-10-CM

## 2013-05-05 DIAGNOSIS — I4891 Unspecified atrial fibrillation: Secondary | ICD-10-CM

## 2013-05-05 LAB — POCT INR: INR: 2.6

## 2013-05-28 ENCOUNTER — Other Ambulatory Visit: Payer: Self-pay | Admitting: Cardiology

## 2013-06-13 ENCOUNTER — Ambulatory Visit (INDEPENDENT_AMBULATORY_CARE_PROVIDER_SITE_OTHER): Payer: Medicare Other | Admitting: Cardiovascular Disease

## 2013-06-13 ENCOUNTER — Ambulatory Visit (INDEPENDENT_AMBULATORY_CARE_PROVIDER_SITE_OTHER): Payer: Medicare Other

## 2013-06-13 ENCOUNTER — Encounter: Payer: Self-pay | Admitting: Cardiovascular Disease

## 2013-06-13 VITALS — BP 112/66 | HR 77 | Ht 71.0 in | Wt 213.0 lb

## 2013-06-13 DIAGNOSIS — I10 Essential (primary) hypertension: Secondary | ICD-10-CM

## 2013-06-13 DIAGNOSIS — I4892 Unspecified atrial flutter: Secondary | ICD-10-CM

## 2013-06-13 DIAGNOSIS — I4891 Unspecified atrial fibrillation: Secondary | ICD-10-CM

## 2013-06-13 DIAGNOSIS — I421 Obstructive hypertrophic cardiomyopathy: Secondary | ICD-10-CM

## 2013-06-13 NOTE — Progress Notes (Signed)
HPI:  72 year-old male with hypertrophic cardiomyopathy and persistent atrial fibrillation. He has undergone 2 atrial fib ablations but has permanent AF. He's been previously followed by Dr Riley Kill and I will be assuming his care.  The patient is feeling pretty well. He has fatigue and is unable to work long hours at the [employer]. Otherwise he has no specific complaints. He denies chest pain, dyspnea, lightheadedness, palpitations, orthopnea, or PND. He has longstanding leg swelling related to stasis.  Outpatient Encounter Prescriptions as of 06/13/2013  Medication Sig Dispense Refill  . acetaminophen (TYLENOL) 325 MG tablet Take 650 mg by mouth every 6 (six) hours as needed.        . diltiazem (TIAZAC) 360 MG 24 hr capsule Take 1 capsule (360 mg total) by mouth daily.  90 capsule  2  . furosemide (LASIX) 20 MG tablet TAKE 1 TABLET EVERY DAY  30 tablet  5  . metoprolol succinate (TOPROL-XL) 25 MG 24 hr tablet TAKE 1 TABLET BY MOUTH EVERY DAY  30 tablet  2  . potassium chloride SA (KLOR-CON M20) 20 MEQ tablet Take 0.5 tablets (10 mEq total) by mouth daily.  30 tablet  5  . warfarin (COUMADIN) 2 MG tablet TAKE 1 TABLET BY MOUTH AS DIRECTED  45 tablet  3   No facility-administered encounter medications on file as of 06/13/2013.    No Known Allergies  Past Medical History  Diagnosis Date  . HTN (hypertension)   . Hypertrophic cardiomyopathy   . Persistent atrial fibrillation   . Venous insufficiency   . Thrombocytopenia    ROS: positive for  Erectile dysfunction, otherwise negative except as per HPI  BP 112/66  Pulse 77  Ht 5\' 11"  (1.803 m)  Wt 96.616 kg (213 lb)  BMI 29.72 kg/m2  SpO2 96%  PHYSICAL EXAM: Pt is alert and oriented, NAD HEENT: normal Neck: JVP - normal, carotids 2+= without bruits Lungs: CTA bilaterally CV: irregularly irregular without murmur or gallop Abd: soft, NT, Positive BS, no hepatomegaly Ext: no C/C/E, distal pulses intact and equal Skin: warm/dry no  rash  EKG:  Atrial fibrillation, nonspecific IVCD, cannot rule out age-indeterminate anterior MI.  2D Echo, April 2013: ------------------------------------------------------------ Study Conclusions  - Left ventricle: The cavity size was normal. There was mild focal basal hypertrophy of the septum. Systolic function was normal. The estimated ejection fraction was in the range of 55% to 60%. Wall motion was normal; there were no regional wall motion abnormalities. - Mitral valve: There was mild systolic anterior motion of the chordal structures. Mild regurgitation. - Left atrium: The atrium was severely dilated. - Right atrium: The atrium was severely dilated. - Pulmonary arteries: Systolic pressure was mildly increased. PA peak pressure: 31mm Hg (S). - Pericardium, extracardiac: A trivial pericardial effusion was identified.  ------------------------------------------------------------ Labs, prior tests, procedures, and surgery: Echocardiography (2010). The mitral valve showed moderate regurgitation. EF was 60%.  Transthoracic echocardiography. M-mode, complete 2D, spectral Doppler, and color Doppler. Height: Height: 180.3cm. Height: 71in. Weight: Weight: 100.2kg. Weight: 220.5lb. Body mass index: BMI: 30.8kg/m^2. Body surface area: BSA: 2.72m^2. Blood pressure: 130/75. Patient status: Outpatient. Location: [facility]  ------------------------------------------------------------  ------------------------------------------------------------ Left ventricle: The cavity size was normal. There was mild focal basal hypertrophy of the septum. Systolic function was normal. The estimated ejection fraction was in the range of 55% to 60%. Wall motion was normal; there were no regional wall motion abnormalities. The study was not technically sufficient to allow evaluation of LV  diastolic dysfunction due to atrial  fibrillation.  ------------------------------------------------------------ Aortic valve: Structurally normal valve. Cusp separation was normal. Doppler: Transvalvular velocity was within the normal range. There was no stenosis. No regurgitation.  ------------------------------------------------------------ Aorta: Aortic root: The aortic root was normal in size.  ------------------------------------------------------------ Mitral valve: Structurally normal valve. Leaflet separation was normal. There was mild systolic anterior motion of the chordal structures. Doppler: Transvalvular velocity was within the normal range. There was no evidence for stenosis. Mild regurgitation.  ------------------------------------------------------------ Left atrium: The atrium was severely dilated.  ------------------------------------------------------------ Right ventricle: The cavity size was normal. Systolic function was normal.  ------------------------------------------------------------ Pulmonic valve: Structurally normal valve. Cusp separation was normal. Doppler: Transvalvular velocity was within the normal range. Trivial regurgitation.  ------------------------------------------------------------ Tricuspid valve: Structurally normal valve. Leaflet separation was normal. Doppler: Transvalvular velocity was within the normal range. Mild regurgitation.  ------------------------------------------------------------ Pulmonary artery: Systolic pressure was mildly increased.  ------------------------------------------------------------ Right atrium: The atrium was severely dilated.  ------------------------------------------------------------ Pericardium: A trivial pericardial effusion was identified.  ------------------------------------------------------------ Systemic veins: Inferior vena cava: The vessel was normal in size; the respirophasic diameter changes were in the normal range  (= 50%); findings are consistent with normal central venous pressure.  ASSESSMENT AND PLAN: 1. Atrial fibrillation. Seems to be tolerating rate-control and anticoagulation well. No bleeding problems. Follow-up in 6 months.  2. Hypertrophic cardiomyopathy. I don't appreciate a murmur on his exam and last echo unremarkable, but had characteristic findings prior to development of AF. 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 Recommend repeat echo.  3. Erectile dysfunction. Will review echo. I have some concern about med Rx/vasodilation in setting of his dx of HCM. Will review echo before decision. He understands.  For follow-up I'll see back in 6 months.  Tonny Bollman 06/15/2013 11:57 PM

## 2013-06-13 NOTE — Patient Instructions (Addendum)
Your physician has requested that you have an echocardiogram. Echocardiography is a painless test that uses sound waves to create images of your heart. It provides your doctor with information about the size and shape of your heart and how well your heart's chambers and valves are working. This procedure takes approximately one hour. There are no restrictions for this procedure.  Your physician wants you to follow-up in: 6 MONTHS with Dr Cooper.  You will receive a reminder letter in the mail two months in advance. If you don't receive a letter, please call our office to schedule the follow-up appointment.  Your physician recommends that you continue on your current medications as directed. Please refer to the Current Medication list given to you today.  

## 2013-06-15 ENCOUNTER — Encounter: Payer: Self-pay | Admitting: Cardiovascular Disease

## 2013-06-16 ENCOUNTER — Ambulatory Visit (HOSPITAL_COMMUNITY): Payer: Medicare Other | Attending: Cardiovascular Disease | Admitting: Radiology

## 2013-06-16 DIAGNOSIS — I10 Essential (primary) hypertension: Secondary | ICD-10-CM | POA: Insufficient documentation

## 2013-06-16 DIAGNOSIS — I421 Obstructive hypertrophic cardiomyopathy: Secondary | ICD-10-CM | POA: Insufficient documentation

## 2013-06-16 DIAGNOSIS — I4891 Unspecified atrial fibrillation: Secondary | ICD-10-CM | POA: Insufficient documentation

## 2013-06-16 NOTE — Progress Notes (Signed)
Echocardiogram performed.  

## 2013-07-01 ENCOUNTER — Telehealth: Payer: Self-pay

## 2013-07-01 MED ORDER — TADALAFIL 10 MG PO TABS: 10.0000 mg | ORAL_TABLET | Freq: Every day | ORAL | Status: DC | PRN

## 2013-07-01 NOTE — Telephone Encounter (Signed)
I spoke with the pt and made him aware that [facility]  I gave the pt instructions about how to use this medication as needed.  I did advise the pt that if he develops CP and has to call EMS or go to the hospital then he should make them aware if he has taken this medication. Pt verbalized understanding.  The pt also plans to try just a half tablet initially to see if this works.

## 2013-07-19 ENCOUNTER — Other Ambulatory Visit: Payer: Self-pay | Admitting: Cardiology

## 2013-08-24 ENCOUNTER — Other Ambulatory Visit: Payer: Self-pay | Admitting: Cardiovascular Disease

## 2013-08-24 ENCOUNTER — Other Ambulatory Visit: Payer: Self-pay | Admitting: Cardiology

## 2013-09-08 ENCOUNTER — Ambulatory Visit (INDEPENDENT_AMBULATORY_CARE_PROVIDER_SITE_OTHER): Payer: Medicare Other | Admitting: Pharmacist

## 2013-09-08 ENCOUNTER — Telehealth: Payer: Self-pay | Admitting: Cardiovascular Disease

## 2013-09-08 DIAGNOSIS — I4891 Unspecified atrial fibrillation: Secondary | ICD-10-CM

## 2013-09-08 DIAGNOSIS — I4892 Unspecified atrial flutter: Secondary | ICD-10-CM

## 2013-09-08 LAB — POCT INR: INR: 2.5

## 2013-09-08 NOTE — Telephone Encounter (Signed)
Please gave me a call when you are back in the office.  Need to discuss my son who think he is having the same problems I'm having.

## 2013-09-09 NOTE — Telephone Encounter (Signed)
I spoke with the pt and he said his son was taken to the ER two weeks ago with what they thought was a heart attack.  The pt was diagnosed with a panic attack. The pt's son is concerned that he could have HOCM and would like to be evaluated.  I made the pt aware that I cannot order testing on his son at this time because he is not a pt in our practice.  I instructed him to have his son contact the PCP and they can order an echo and refer him to cardiology. The son's name is [employer].

## 2013-10-18 ENCOUNTER — Ambulatory Visit (INDEPENDENT_AMBULATORY_CARE_PROVIDER_SITE_OTHER): Payer: Medicare Other | Admitting: Pharmacist

## 2013-10-18 DIAGNOSIS — I4891 Unspecified atrial fibrillation: Secondary | ICD-10-CM

## 2013-10-18 DIAGNOSIS — I4892 Unspecified atrial flutter: Secondary | ICD-10-CM

## 2013-10-18 DIAGNOSIS — Z23 Encounter for immunization: Secondary | ICD-10-CM

## 2013-10-18 LAB — POCT INR: INR: 2.4

## 2013-10-22 ENCOUNTER — Other Ambulatory Visit: Payer: Self-pay | Admitting: Cardiology

## 2013-11-24 ENCOUNTER — Other Ambulatory Visit: Payer: Self-pay | Admitting: Cardiovascular Disease

## 2013-11-25 ENCOUNTER — Other Ambulatory Visit: Payer: Self-pay | Admitting: Cardiovascular Disease

## 2013-11-29 ENCOUNTER — Ambulatory Visit (INDEPENDENT_AMBULATORY_CARE_PROVIDER_SITE_OTHER): Payer: Medicare Other

## 2013-11-29 DIAGNOSIS — Z5181 Encounter for therapeutic drug level monitoring: Secondary | ICD-10-CM | POA: Insufficient documentation

## 2013-11-29 DIAGNOSIS — I4891 Unspecified atrial fibrillation: Secondary | ICD-10-CM

## 2013-11-29 DIAGNOSIS — I4892 Unspecified atrial flutter: Secondary | ICD-10-CM

## 2013-11-29 LAB — POCT INR: INR: 2.4

## 2013-12-12 ENCOUNTER — Other Ambulatory Visit: Payer: Self-pay | Admitting: Cardiology

## 2013-12-16 ENCOUNTER — Encounter: Payer: Self-pay | Admitting: Cardiovascular Disease

## 2013-12-16 ENCOUNTER — Ambulatory Visit (INDEPENDENT_AMBULATORY_CARE_PROVIDER_SITE_OTHER): Payer: Medicare Other | Admitting: Cardiovascular Disease

## 2013-12-16 VITALS — BP 128/70 | HR 80

## 2013-12-16 DIAGNOSIS — I4891 Unspecified atrial fibrillation: Secondary | ICD-10-CM

## 2013-12-16 DIAGNOSIS — E7800 Pure hypercholesterolemia, unspecified: Secondary | ICD-10-CM

## 2013-12-16 MED ORDER — SILDENAFIL CITRATE 50 MG PO TABS: 50.0000 mg | ORAL_TABLET | Freq: Every day | ORAL | Status: DC | PRN

## 2013-12-16 NOTE — Patient Instructions (Addendum)
Your physician wants you to follow-up in:  6 months. You will receive a reminder letter in the mail two months in advance. If you don't receive a letter, please call our office to schedule the follow-up appointment.  Your physician recommends that you return for fasting lab work week or so prior to appointment with Dr. [name] in 6 months.

## 2013-12-16 NOTE — Progress Notes (Addendum)
HPI:  73 year old gentleman presenting for followup evaluation. He has hypertrophic cardiomyopathy and persistent atrial fibrillation.  The patient had some hematuria recently. He was thought to have a urinary tract infection and was started on Cipro. This cleared up quickly after initiating antibiotics. From a cardiac perspective he is doing well. He denies chest pain, shortness of breath, or palpitations. He's had no leg swelling, orthopnea, or PND. He's tolerating warfarin with the exception of some blood in his urine as noted above. He tried Cialis after it was prescribed for erectile dysfunction at the time of his last office visit. It was ineffective and he would like to try something different if possible.  Outpatient Encounter Prescriptions as of 12/16/2013  Medication Sig  . acetaminophen (TYLENOL) 325 MG tablet Take 650 mg by mouth every 6 (six) hours as needed.    . ciprofloxacin (CIPRO) 500 MG tablet   . diltiazem (TIAZAC) 360 MG 24 hr capsule TAKE 1 CAPSULE (360 MG TOTAL) BY MOUTH DAILY.  . furosemide (LASIX) 20 MG tablet TAKE 1 TABLET EVERY DAY  . KLOR-CON M20 20 MEQ tablet TAKE 1/2 TABLET BY MOUTH DAILY  . metoprolol succinate (TOPROL-XL) 25 MG 24 hr tablet TAKE 1 TABLET BY MOUTH EVERY DAY  . warfarin (COUMADIN) 2 MG tablet TAKE 1 TABLET BY MOUTH AS DIRECTED  . [DISCONTINUED] tadalafil (CIALIS) 10 MG tablet Take 1 tablet (10 mg total) by mouth daily as needed for erectile dysfunction.    No Known Allergies  Past Medical History  Diagnosis Date  . HTN (hypertension)   . Hypertrophic cardiomyopathy   . Persistent atrial fibrillation   . Venous insufficiency   . Thrombocytopenia     ROS: Negative except as per HPI  BP 128/70  Pulse 80  PHYSICAL EXAM: Pt is alert and oriented, NAD HEENT: normal Neck: JVP - normal, carotids 2+= without bruits Lungs: CTA bilaterally CV: Irregularly irregular without murmur or gallop Abd: soft, NT, Positive BS, no hepatomegaly Ext:  no C/C/E, distal pulses intact and equal Skin: warm/dry no rash  EKG:  Atrial fibrillation 80 beats per minute, age-indeterminate septal infarct, nonspecific ST abnormality.  2-D echocardiogram 06/16/2013:Left ventricle: The cavity size was normal. Wall thickness was increased in a pattern of moderate LVH. Systolic function was normal. The estimated ejection fraction was in the range of 55% to 60%. Wall motion was normal; there were no regional wall motion abnormalities. The study was not technically sufficient to allow evaluation of LV diastolic dysfunction due to atrial fibrillation. Doppler parameters are consistent with high ventricular filling pressure.  ------------------------------------------------------------ Aortic valve: Trileaflet; normal thickness leaflets. Mobility was not restricted. Doppler: Transvalvular velocity was within the normal range. There was no stenosis. No regurgitation.  ------------------------------------------------------------ Aorta: Aortic root: The aortic root was normal in size.  ------------------------------------------------------------ Mitral valve: Structurally normal valve. Mobility was not restricted. There was mild systolic anterior motion of the anterior leaflet and posterior leaflet. Doppler: Transvalvular velocity was within the normal range. There was no evidence for stenosis. Mild regurgitation. Peak gradient: 40mm Hg (D).  ------------------------------------------------------------ Left atrium: The atrium was severely dilated.  ------------------------------------------------------------ Right ventricle: The cavity size was normal. Systolic function was normal.  ------------------------------------------------------------ Pulmonic valve: Doppler: Transvalvular velocity was within the normal range. There was no evidence for stenosis.  ------------------------------------------------------------ Tricuspid valve: Structurally  normal valve. Doppler: Transvalvular velocity was within the normal range. Trivial regurgitation.  ------------------------------------------------------------ Pulmonary artery: Systolic pressure was mildly increased.  ------------------------------------------------------------ Right atrium: The atrium was moderately dilated.  ------------------------------------------------------------  Pericardium: There was no pericardial effusion.  ------------------------------------------------------------ Systemic veins: Inferior vena cava: The vessel was normal in size.  ASSESSMENT AND PLAN: 1. Hypertrophic cardiomyopathy. Fairly mild changes noted on his echocardiogram. There is no significant LV outflow tract gradient. Continue with clinical followup.  2. Atrial fibrillation. He is managed with rate control and anticoagulation. He will continue on diltiazem, long-acting metoprolol, and warfarin.  3. Erectile dysfunction. Trial of Viagra 25-50 mg as needed.  For followup I would like to see him back in 6 months.  [name] 12/16/2013 3:09 PM   Received request for bladder surgery. The patient was recently seen in the office and he has an updated echocardiogram. He was stable without significant cardiopulmonary limitation. He can hold warfarin 5-7 before surgery and resume when ok from post-op bleeding perspective. No further cardiac testing is indicated. Would be happy to see him if any perioperative cardiac issues arise.   [name] 01/26/2014 9:08 PM

## 2013-12-25 ENCOUNTER — Other Ambulatory Visit: Payer: Self-pay | Admitting: Cardiovascular Disease

## 2014-01-13 ENCOUNTER — Ambulatory Visit (INDEPENDENT_AMBULATORY_CARE_PROVIDER_SITE_OTHER): Payer: Medicare Other | Admitting: *Deleted

## 2014-01-13 DIAGNOSIS — I4891 Unspecified atrial fibrillation: Secondary | ICD-10-CM

## 2014-01-13 DIAGNOSIS — I4892 Unspecified atrial flutter: Secondary | ICD-10-CM

## 2014-01-13 DIAGNOSIS — Z5181 Encounter for therapeutic drug level monitoring: Secondary | ICD-10-CM

## 2014-01-13 LAB — POCT INR: INR: 1.9

## 2014-01-20 ENCOUNTER — Other Ambulatory Visit: Payer: Self-pay | Admitting: Cardiovascular Disease

## 2014-01-21 ENCOUNTER — Other Ambulatory Visit: Payer: Self-pay | Admitting: Cardiovascular Disease

## 2014-01-25 ENCOUNTER — Telehealth: Payer: Self-pay | Admitting: Cardiovascular Disease

## 2014-01-25 NOTE — Telephone Encounter (Signed)
New message     Need clearance for bladder tumor surgery and to hold coumadin. Please fax clearance to [phone].

## 2014-01-26 NOTE — Telephone Encounter (Signed)
Addendum done to most recent office note. thx

## 2014-01-31 ENCOUNTER — Other Ambulatory Visit: Payer: Self-pay | Admitting: Urology

## 2014-01-31 NOTE — Telephone Encounter (Signed)
12/16/13 office note faxed per Pam's request.

## 2014-01-31 NOTE — Telephone Encounter (Deleted)
Follow up     Calling because clearance note has not been

## 2014-02-02 ENCOUNTER — Encounter: Payer: Self-pay | Admitting: Cardiology

## 2014-02-03 ENCOUNTER — Encounter (HOSPITAL_BASED_OUTPATIENT_CLINIC_OR_DEPARTMENT_OTHER): Payer: Self-pay | Admitting: *Deleted

## 2014-02-06 ENCOUNTER — Encounter (HOSPITAL_BASED_OUTPATIENT_CLINIC_OR_DEPARTMENT_OTHER): Payer: Self-pay | Admitting: *Deleted

## 2014-02-07 ENCOUNTER — Encounter (HOSPITAL_BASED_OUTPATIENT_CLINIC_OR_DEPARTMENT_OTHER): Payer: Self-pay | Admitting: *Deleted

## 2014-02-07 NOTE — Progress Notes (Addendum)
NPO AFTER MN. ARRIVE AT [id number]. (DUE TO WORK SCHEDULE PT UNABLE TO COME BEFORE DOS).  NEEDS CBC, BMET, PT/INR.  CURRENT EKG IN CHART AND EPIC.  CARDIAC CLEARANCE WITH CHART. WILL TAKE DILTIAZEM AM DOS W/ SIPS OF WATER.    REVIEWED CHART W/ DR FORTUNE MDA,  OK TO PROCEED.

## 2014-02-10 ENCOUNTER — Ambulatory Visit (HOSPITAL_BASED_OUTPATIENT_CLINIC_OR_DEPARTMENT_OTHER): Payer: Medicare Other | Admitting: Anesthesiology

## 2014-02-10 ENCOUNTER — Encounter (HOSPITAL_BASED_OUTPATIENT_CLINIC_OR_DEPARTMENT_OTHER)
Admission: RE | Disposition: A | Discharge status: Home / Self Care | Payer: Self-pay | Source: Ambulatory Visit | Attending: Urology

## 2014-02-10 ENCOUNTER — Encounter (HOSPITAL_BASED_OUTPATIENT_CLINIC_OR_DEPARTMENT_OTHER): Payer: Self-pay | Admitting: *Deleted

## 2014-02-10 ENCOUNTER — Encounter (HOSPITAL_BASED_OUTPATIENT_CLINIC_OR_DEPARTMENT_OTHER): Payer: Medicare Other | Admitting: Anesthesiology

## 2014-02-10 ENCOUNTER — Ambulatory Visit (HOSPITAL_BASED_OUTPATIENT_CLINIC_OR_DEPARTMENT_OTHER)
Admission: RE | Admit: 2014-02-10 | Discharge: 2014-02-10 | Disposition: A | Discharge status: Home / Self Care | Payer: Medicare Other | Source: Ambulatory Visit | Attending: Urology | Admitting: Urology

## 2014-02-10 DIAGNOSIS — R310 Gross hematuria: Secondary | ICD-10-CM | POA: Insufficient documentation

## 2014-02-10 DIAGNOSIS — I517 Cardiomegaly: Secondary | ICD-10-CM | POA: Insufficient documentation

## 2014-02-10 DIAGNOSIS — N529 Male erectile dysfunction, unspecified: Secondary | ICD-10-CM | POA: Insufficient documentation

## 2014-02-10 DIAGNOSIS — F411 Generalized anxiety disorder: Secondary | ICD-10-CM | POA: Insufficient documentation

## 2014-02-10 DIAGNOSIS — R300 Dysuria: Secondary | ICD-10-CM | POA: Insufficient documentation

## 2014-02-10 DIAGNOSIS — K4090 Unilateral inguinal hernia, without obstruction or gangrene, not specified as recurrent: Secondary | ICD-10-CM | POA: Insufficient documentation

## 2014-02-10 DIAGNOSIS — C679 Malignant neoplasm of bladder, unspecified: Secondary | ICD-10-CM | POA: Insufficient documentation

## 2014-02-10 DIAGNOSIS — I10 Essential (primary) hypertension: Secondary | ICD-10-CM | POA: Insufficient documentation

## 2014-02-10 DIAGNOSIS — R011 Cardiac murmur, unspecified: Secondary | ICD-10-CM | POA: Insufficient documentation

## 2014-02-10 DIAGNOSIS — Z87891 Personal history of nicotine dependence: Secondary | ICD-10-CM | POA: Insufficient documentation

## 2014-02-10 DIAGNOSIS — F3289 Other specified depressive episodes: Secondary | ICD-10-CM | POA: Insufficient documentation

## 2014-02-10 DIAGNOSIS — I739 Peripheral vascular disease, unspecified: Secondary | ICD-10-CM | POA: Insufficient documentation

## 2014-02-10 DIAGNOSIS — Z800 Family history of malignant neoplasm of digestive organs: Secondary | ICD-10-CM | POA: Insufficient documentation

## 2014-02-10 DIAGNOSIS — F329 Major depressive disorder, single episode, unspecified: Secondary | ICD-10-CM | POA: Insufficient documentation

## 2014-02-10 DIAGNOSIS — Z7901 Long term (current) use of anticoagulants: Secondary | ICD-10-CM | POA: Insufficient documentation

## 2014-02-10 DIAGNOSIS — D494 Neoplasm of unspecified behavior of bladder: Secondary | ICD-10-CM

## 2014-02-10 HISTORY — DX: Personal history of other diseases of the nervous system and sense organs: Z86.69

## 2014-02-10 HISTORY — DX: Presence of spectacles and contact lenses: Z97.3

## 2014-02-10 HISTORY — DX: Essential (primary) hypertension: I10

## 2014-02-10 HISTORY — PX: TRANSURETHRAL RESECTION OF BLADDER TUMOR: SHX2575

## 2014-02-10 HISTORY — DX: Personal history of colon polyps, unspecified: Z86.0100

## 2014-02-10 HISTORY — DX: Male erectile dysfunction, unspecified: N52.9

## 2014-02-10 HISTORY — DX: Anxiety disorder, unspecified: F41.9

## 2014-02-10 HISTORY — DX: Personal history of peptic ulcer disease: Z87.11

## 2014-02-10 HISTORY — DX: Personal history of colonic polyps: Z86.010

## 2014-02-10 HISTORY — DX: Personal history of other diseases of the digestive system: Z87.19

## 2014-02-10 HISTORY — DX: Other specified postprocedural states: Z98.890

## 2014-02-10 LAB — CBC
HCT: 45.5 % (ref 39.0–52.0)
HEMOGLOBIN: 15 g/dL (ref 13.0–17.0)
MCH: 30.6 pg (ref 26.0–34.0)
MCHC: 33 g/dL (ref 30.0–36.0)
MCV: 92.9 fL (ref 78.0–100.0)
Platelets: 128 10*3/uL — ABNORMAL LOW (ref 150–400)
RBC: 4.9 MIL/uL (ref 4.22–5.81)
RDW: 14.4 % (ref 11.5–15.5)
WBC: 7.3 10*3/uL (ref 4.0–10.5)

## 2014-02-10 LAB — BASIC METABOLIC PANEL
BUN: 22 mg/dL (ref 6–23)
CALCIUM: 9.5 mg/dL (ref 8.4–10.5)
CO2: 27 meq/L (ref 19–32)
Chloride: 103 mEq/L (ref 96–112)
Creatinine, Ser: 1.13 mg/dL (ref 0.50–1.35)
GFR calc Af Amer: 73 mL/min — ABNORMAL LOW (ref >90)
GFR calc non Af Amer: 63 mL/min — ABNORMAL LOW (ref >90)
GLUCOSE: 99 mg/dL (ref 70–99)
POTASSIUM: 3.6 meq/L — AB (ref 3.7–5.3)
SODIUM: 142 meq/L (ref 137–147)

## 2014-02-10 LAB — PROTIME-INR
INR: 1.17 (ref 0.00–1.49)
Prothrombin Time: 14.7 seconds (ref 11.6–15.2)

## 2014-02-10 SURGERY — TURBT (TRANSURETHRAL RESECTION OF BLADDER TUMOR)
Anesthesia: General | Site: Bladder

## 2014-02-10 MED ORDER — OXYCODONE HCL 5 MG PO TABS
5.0000 mg | ORAL_TABLET | Freq: Once | ORAL | Status: AC | PRN
  Administered 2014-02-10: 5 mg via ORAL
  Filled 2014-02-10: qty 1

## 2014-02-10 MED ORDER — ACETAMINOPHEN 10 MG/ML IV SOLN
INTRAVENOUS | Status: DC | PRN
  Administered 2014-02-10: 1000 mg via INTRAVENOUS

## 2014-02-10 MED ORDER — PROPOFOL 10 MG/ML IV BOLUS
INTRAVENOUS | Status: DC | PRN
  Administered 2014-02-10: 200 mg via INTRAVENOUS

## 2014-02-10 MED ORDER — GLYCOPYRROLATE 0.2 MG/ML IJ SOLN
INTRAMUSCULAR | Status: DC | PRN
  Administered 2014-02-10: 0.2 mg via INTRAVENOUS

## 2014-02-10 MED ORDER — LIDOCAINE HCL (CARDIAC) 20 MG/ML IV SOLN
INTRAVENOUS | Status: DC | PRN
  Administered 2014-02-10: 80 mg via INTRAVENOUS

## 2014-02-10 MED ORDER — HYDROMORPHONE HCL PF 1 MG/ML IJ SOLN
INTRAMUSCULAR | Status: AC
  Filled 2014-02-10: qty 1

## 2014-02-10 MED ORDER — METOCLOPRAMIDE HCL 5 MG/ML IJ SOLN
INTRAMUSCULAR | Status: DC | PRN
  Administered 2014-02-10: 10 mg via INTRAVENOUS

## 2014-02-10 MED ORDER — MIDAZOLAM HCL 5 MG/5ML IJ SOLN
INTRAMUSCULAR | Status: DC | PRN
  Administered 2014-02-10 (×2): 1 mg via INTRAVENOUS

## 2014-02-10 MED ORDER — CIPROFLOXACIN IN D5W 400 MG/200ML IV SOLN
INTRAVENOUS | Status: AC
  Filled 2014-02-10: qty 200

## 2014-02-10 MED ORDER — MITOMYCIN CHEMO FOR BLADDER INSTILLATION 40 MG
40.0000 mg | Freq: Once | INTRAVENOUS | Status: AC
  Administered 2014-02-10: 40 mg via INTRAVESICAL
  Filled 2014-02-10: qty 40

## 2014-02-10 MED ORDER — PROMETHAZINE HCL 25 MG/ML IJ SOLN
6.2500 mg | INTRAMUSCULAR | Status: DC | PRN
  Filled 2014-02-10: qty 1

## 2014-02-10 MED ORDER — FENTANYL CITRATE 0.05 MG/ML IJ SOLN
INTRAMUSCULAR | Status: AC
  Filled 2014-02-10: qty 4

## 2014-02-10 MED ORDER — CIPROFLOXACIN HCL 500 MG PO TABS
ORAL_TABLET | ORAL | Status: AC
  Filled 2014-02-10: qty 1

## 2014-02-10 MED ORDER — PHENAZOPYRIDINE HCL 100 MG PO TABS
ORAL_TABLET | ORAL | Status: AC
  Filled 2014-02-10: qty 2

## 2014-02-10 MED ORDER — LACTATED RINGERS IV SOLN
INTRAVENOUS | Status: DC
  Administered 2014-02-10: 50 mL/h via INTRAVENOUS
  Administered 2014-02-10: 06:00:00 via INTRAVENOUS
  Filled 2014-02-10: qty 1000

## 2014-02-10 MED ORDER — MIDAZOLAM HCL 2 MG/2ML IJ SOLN
INTRAMUSCULAR | Status: AC
  Filled 2014-02-10: qty 2

## 2014-02-10 MED ORDER — OXYCODONE HCL 5 MG/5ML PO SOLN
5.0000 mg | Freq: Once | ORAL | Status: AC | PRN
  Filled 2014-02-10: qty 5

## 2014-02-10 MED ORDER — OXYCODONE HCL 5 MG PO TABS
ORAL_TABLET | ORAL | Status: AC
  Filled 2014-02-10: qty 1

## 2014-02-10 MED ORDER — SODIUM CHLORIDE 0.9 % IR SOLN
Status: DC | PRN
  Administered 2014-02-10: 12000 mL via INTRAVESICAL

## 2014-02-10 MED ORDER — ONDANSETRON HCL 4 MG/2ML IJ SOLN
INTRAMUSCULAR | Status: DC | PRN
  Administered 2014-02-10: 4 mg via INTRAVENOUS

## 2014-02-10 MED ORDER — PHENAZOPYRIDINE HCL 200 MG PO TABS: 200.0000 mg | ORAL_TABLET | Freq: Three times a day (TID) | ORAL | Status: DC | PRN

## 2014-02-10 MED ORDER — MEPERIDINE HCL 25 MG/ML IJ SOLN
6.2500 mg | INTRAMUSCULAR | Status: DC | PRN
  Filled 2014-02-10: qty 1

## 2014-02-10 MED ORDER — FENTANYL CITRATE 0.05 MG/ML IJ SOLN
INTRAMUSCULAR | Status: DC | PRN
  Administered 2014-02-10 (×2): 50 ug via INTRAVENOUS

## 2014-02-10 MED ORDER — CIPROFLOXACIN IN D5W 200 MG/100ML IV SOLN
200.0000 mg | INTRAVENOUS | Status: AC
  Administered 2014-02-10: 200 mg via INTRAVENOUS
  Filled 2014-02-10: qty 100

## 2014-02-10 MED ORDER — OXYCODONE-ACETAMINOPHEN 10-325 MG PO TABS: 1.0000 | ORAL_TABLET | ORAL | Status: DC | PRN

## 2014-02-10 MED ORDER — PHENAZOPYRIDINE HCL 200 MG PO TABS
200.0000 mg | ORAL_TABLET | Freq: Once | ORAL | Status: AC | PRN
  Administered 2014-02-10: 200 mg via ORAL
  Filled 2014-02-10: qty 1

## 2014-02-10 MED ORDER — HYDROMORPHONE HCL PF 1 MG/ML IJ SOLN
0.2500 mg | INTRAMUSCULAR | Status: DC | PRN
  Administered 2014-02-10: 0.5 mg via INTRAVENOUS
  Administered 2014-02-10: 0.25 mg via INTRAVENOUS
  Filled 2014-02-10: qty 1

## 2014-02-10 MED ORDER — DEXAMETHASONE SODIUM PHOSPHATE 4 MG/ML IJ SOLN
INTRAMUSCULAR | Status: DC | PRN
  Administered 2014-02-10: 10 mg via INTRAVENOUS

## 2014-02-10 SURGICAL SUPPLY — 28 items
BAG DRAIN URO-CYSTO SKYTR STRL (DRAIN) ×2 IMPLANT
BAG URINE DRAINAGE (UROLOGICAL SUPPLIES) IMPLANT
BAG URINE LEG 19OZ MD ST LTX (BAG) IMPLANT
CANISTER SUCT LVC 12 LTR MEDI- (MISCELLANEOUS) ×4 IMPLANT
CATH FOLEY 2WAY SLVR  5CC 20FR (CATHETERS) ×1
CATH FOLEY 2WAY SLVR 5CC 20FR (CATHETERS) ×1 IMPLANT
CLOTH BEACON ORANGE TIMEOUT ST (SAFETY) ×2 IMPLANT
DRAPE CAMERA CLOSED 9X96 (DRAPES) ×2 IMPLANT
ELECT BUTTON HF 24-28F 2 30DE (ELECTRODE) IMPLANT
ELECT LOOP MED HF 24F 12D (CUTTING LOOP) IMPLANT
ELECT LOOP MED HF 24F 12D CBL (CLIP) ×2 IMPLANT
ELECT REM PT RETURN 9FT ADLT (ELECTROSURGICAL) ×2
ELECT RESECT VAPORIZE 12D CBL (ELECTRODE) IMPLANT
ELECTRODE REM PT RTRN 9FT ADLT (ELECTROSURGICAL) ×1 IMPLANT
EVACUATOR MICROVAS BLADDER (UROLOGICAL SUPPLIES) ×2 IMPLANT
GLOVE BIO SURGEON STRL SZ 6.5 (GLOVE) ×2 IMPLANT
GLOVE BIO SURGEON STRL SZ8 (GLOVE) ×2 IMPLANT
GLOVE INDICATOR 6.5 STRL GRN (GLOVE) ×2 IMPLANT
GOWN STRL REUS W/ TWL LRG LVL3 (GOWN DISPOSABLE) ×1 IMPLANT
GOWN STRL REUS W/ TWL XL LVL3 (GOWN DISPOSABLE) ×1 IMPLANT
GOWN STRL REUS W/TWL LRG LVL3 (GOWN DISPOSABLE) ×1
GOWN STRL REUS W/TWL XL LVL3 (GOWN DISPOSABLE) ×1
HOLDER FOLEY CATH W/STRAP (MISCELLANEOUS) ×2 IMPLANT
IV NS IRRIG 3000ML ARTHROMATIC (IV SOLUTION) ×8 IMPLANT
PACK CYSTOSCOPY (CUSTOM PROCEDURE TRAY) ×2 IMPLANT
PLUG CATH AND CAP STER (CATHETERS) IMPLANT
SET ASPIRATION TUBING (TUBING) ×2 IMPLANT
WATER STERILE IRR 3000ML UROMA (IV SOLUTION) IMPLANT

## 2014-02-10 NOTE — Discharge Instructions (Signed)
Transurethral Resection of Bladder Tumor (TURBT)   Definition:  Transurethral Resection of the Bladder Tumor is a surgical procedure used to diagnose and remove tumors within the bladder. TURBT is the most common treatment for early stage bladder cancer.  General instructions:     Your recent bladder surgery requires very little post hospital care but some definite precautions.  Despite the fact that no skin incisions were used, the area around the bladder incisions are raw and covered with scabs to promote healing and prevent bleeding. Certain precautions are needed to insure that the scabs are not disturbed over the next 2-4 weeks while the healing proceeds.  Because the raw surface inside your bladder and the irritating effects of urine you may expect frequency of urination and/or urgency (a stronger desire to urinate) and perhaps even getting up at night more often. This will usually resolve or improve slowly over the healing period. You may see some blood in your urine over the first 6 weeks. Do not be alarmed, even if the urine was clear for a while. Get off your feet and drink lots of fluids until clearing occurs. If you start to pass clots or don't improve call us.  Catheter: (If you are discharged with a catheter.)  1. Keep your catheter secured to your leg at all times with tape or the supplied strap. 2. You may experience leakage of urine around your catheter- as long as the  catheter continues to drain, this is normal.  If your catheter stops draining  go to the ER. 3. You may also have blood in your urine, even after it has been clear for  several days; you may even pass some small blood clots or other material.  This  is normal as well.  If this happens, sit down and drink plenty of water to help  make urine to flush out your bladder.  If the blood in your urine becomes worse  after doing this, contact our office or return to the ER. 4. You may use the leg bag (small bag)  during the day, but use the large bag at  night.  Diet:  You may return to your normal diet immediately. Because of the raw surface of your bladder, alcohol, spicy foods, foods high in acid and drinks with caffeine may cause irritation or frequency and should be used in moderation. To keep your urine flowing freely and avoid constipation, drink plenty of fluids during the day (8-10 glasses). Tip: Avoid cranberry juice because it is very acidic.  Activity:  Your physical activity doesn't need to be restricted. However, if you are very active, you may see some blood in the urine. We suggest that you reduce your activity under the circumstances until the bleeding has stopped.  Bowels:  It is important to keep your bowels regular during the postoperative period. Straining with bowel movements can cause bleeding. A bowel movement every other day is reasonable. Use a mild laxative if needed, such as milk of magnesia 2-3 tablespoons, or 2 Dulcolax tablets. Call if you continue to have problems. If you had been taking narcotics for pain, before, during or after your surgery, you may be constipated. Take a laxative if necessary.    Medication:  You should resume your pre-surgery medications unless told not to. In addition you may be given an antibiotic to prevent or treat infection. Antibiotics are not always necessary. All medication should be taken as prescribed until the bottles are finished unless you are having  an unusual reaction to one of the drugs.    Post Anesthesia Home Care Instructions  Activity: Get plenty of rest for the remainder of the day. A responsible adult should stay with you for 24 hours following the procedure.  For the next 24 hours, DO NOT: -Drive a car -Operate machinery -Drink alcoholic beverages -Take any medication unless instructed by your physician -Make any legal decisions or sign important papers.  Meals: Start with liquid foods such as gelatin or soup.  Progress to regular foods as tolerated. Avoid greasy, spicy, heavy foods. If nausea and/or vomiting occur, drink only clear liquids until the nausea and/or vomiting subsides. Call your physician if vomiting continues.  Special Instructions/Symptoms: Your throat may feel dry or sore from the anesthesia or the breathing tube placed in your throat during surgery. If this causes discomfort, gargle with warm salt water. The discomfort should disappear within 24 hours.        

## 2014-02-10 NOTE — Transfer of Care (Signed)
Immediate Anesthesia Transfer of Care Note  Patient: Bradley Winters  Procedure(s) Performed: Procedure(s) (LRB): TRANSURETHRAL RESECTION OF BLADDER TUMOR (TURBT) (N/A)  Patient Location: PACU  Anesthesia Type: General  Level of Consciousness: awake, alert  and oriented  Airway & Oxygen Therapy: Patient Spontanous Breathing and Patient connected to face mask oxygen  Post-op Assessment: Report given to PACU RN and Post -op Vital signs reviewed and stable  Post vital signs: Reviewed and stable  Complications: No apparent anesthesia complications

## 2014-02-10 NOTE — Anesthesia Preprocedure Evaluation (Signed)
Anesthesia Evaluation  Patient identified by MRN, date of birth, ID band Patient awake    Reviewed: Allergy & Precautions, H&P , NPO status , Patient's Chart, lab work & pertinent test results  Airway Mallampati: II TM Distance: >3 FB Neck ROM: Full    Dental  (+) Dental Advisory Given   Pulmonary neg pulmonary ROS, former smoker,  breath sounds clear to auscultation        Cardiovascular hypertension, Pt. on medications and Pt. on home beta blockers + Peripheral Vascular Disease + dysrhythmias Atrial Fibrillation Rhythm:Regular Rate:Normal  hocm   Neuro/Psych PSYCHIATRIC DISORDERS Anxiety negative neurological ROS     GI/Hepatic negative GI ROS, Neg liver ROS,   Endo/Other  negative endocrine ROS  Renal/GU negative Renal ROS     Musculoskeletal negative musculoskeletal ROS (+)   Abdominal   Peds  Hematology thrombocytopenia   Anesthesia Other Findings   Reproductive/Obstetrics                           Anesthesia Physical Anesthesia Plan  ASA: III  Anesthesia Plan: General   Post-op Pain Management:    Induction: Intravenous  Airway Management Planned: LMA  Additional Equipment:   Intra-op Plan:   Post-operative Plan: Extubation in OR  Informed Consent: I have reviewed the patients History and Physical, chart, labs and discussed the procedure including the risks, benefits and alternatives for the proposed anesthesia with the patient or authorized representative who has indicated his/her understanding and acceptance.   Dental advisory given  Plan Discussed with: CRNA  Anesthesia Plan Comments:         Anesthesia Quick Evaluation

## 2014-02-10 NOTE — Anesthesia Procedure Notes (Signed)
Procedure Name: LMA Insertion Date/Time: 02/10/2014 7:38 AM Performed by: Mechele Claude Pre-anesthesia Checklist: Patient identified, Emergency Drugs available, Suction available and Patient being monitored Patient Re-evaluated:Patient Re-evaluated prior to inductionOxygen Delivery Method: Circle System Utilized Preoxygenation: Pre-oxygenation with 100% oxygen Intubation Type: IV induction Ventilation: Mask ventilation without difficulty LMA: LMA inserted LMA Size: 5.0 Number of attempts: 1 Airway Equipment and Method: bite block Placement Confirmation: positive ETCO2 Tube secured with: Tape Dental Injury: Teeth and Oropharynx as per pre-operative assessment

## 2014-02-10 NOTE — Op Note (Signed)
PATIENT:  Bradley Winters  PRE-OPERATIVE DIAGNOSIS: Bladder tumor  POST-OPERATIVE DIAGNOSIS: Same  PROCEDURE:  Procedure(s): 1. TRANSURETHRAL RESECTION OF BLADDER TUMOR (TURBT) (2.6 cm.) 2. Intravesical chemotherapy instillation  SURGEON:  Surgeon(s): [name]  ANESTHESIA:   General  EBL:  Minimal  DRAINS: Urinary Catheter (20 Fr. Foley)   SPECIMEN:  Source of Specimen:  Bladder tumor  DISPOSITION OF SPECIMEN:  PATHOLOGY  Indication: [name] is a 73 year old male who experienced gross hematuria with a negative urine culture on Coumadin. He was experiencing significant dysuria. Upper tract evaluation with a CT scan was found to be normal. Cystoscopically he was noted to have a large tumor involving the left wall and floor of the bladder as well as some papillary tumor on the posterior wall the right-hand side. He is brought to the operating room today for transurethral resection of his bladder tumors and postoperative mitomycin-C installation.  Description of operation: The patient was taken to the operating room and administered general anesthesia. He was then placed on the table and moved to the dorsal lithotomy position after which his genitalia was sterilely prepped and draped. An official timeout was then performed.  The 38 French resectoscope with visual obturator was then introduced into the bladder and the obturator was removed. I noted the urethra was normal down to the bulbar region where a very mild stricture was identified but it allowed passage of the scope without difficulty. The resectoscope element with 12 lens was then inserted and the bladder was fully and systematically inspected. Ureteral orifices were noted to be in the normal anatomic positions. I noted a large papillary tumor involving the left wall and floor of the bladder extending onto the posterior wall to some degree and also a papillary tumor on the posterior wall of the right-hand side extending  toward the midline.  I first began by resecting tumor on the left wall of the bladder down to the level of the detrusor. I resected all of the papillary tumor which in this area measured approximately 2.6 cm. I then resected the papillary tumor on the posterior wall of the bladder on the right-hand side. Reinspection of the bladder revealed all obvious tumor had been fully resected and there was no evidence of perforation. The Microvasive evacuator was then used to irrigate the bladder and remove all of the portions of bladder tumor which were sent to pathology. I then removed the resectoscope.  A 20 French Foley catheter was then inserted in the bladder and irrigated. The irrigant returned slightly pink with no clots. The patient was awakened and taken to the recovery room.  While in the recovery room 40 mg of mitomycin-C in 40 cc of water were instilled in the bladder through the catheter and the catheter was plugged. This will remain indwelling for approximately one hour. It will then be drained from the bladder and the catheter will be removed and the patient discharged home.  PLAN OF CARE: Discharge to home after PACU  PATIENT DISPOSITION:  PACU - hemodynamically stable.

## 2014-02-10 NOTE — Progress Notes (Signed)
Dr. Karsten Ro called and reported x 3 50 ml voids of red/ orange urine and bladder scan of 0cc, okay to be discharged home.

## 2014-02-10 NOTE — Anesthesia Postprocedure Evaluation (Signed)
Anesthesia Post Note  Patient: Bradley Winters  Procedure(s) Performed: Procedure(s) (LRB): TRANSURETHRAL RESECTION OF BLADDER TUMOR (TURBT) (N/A)  Anesthesia type: General  Patient location: PACU  Post pain: Pain level controlled  Post assessment: Post-op Vital signs reviewed  Last Vitals: BP 114/61  Pulse 64  Temp(Src) 36.6 C (Oral)  Resp 16  Ht 5\' 11"  (1.803 m)  Wt 226 lb (102.513 kg)  BMI 31.53 kg/m2  SpO2 91%  Post vital signs: Reviewed  Level of consciousness: sedated  Complications: No apparent anesthesia complications

## 2014-02-10 NOTE — H&P (Signed)
History of Present Illness       Gross hematuria. He experienced gross hematuria beginning in 6/14 and was found to have a normal serum creatinine in 2/15 of 1.05 and also a negative urine culture. His PSA at that time was 1.1. He is on Coumadin.    Erectile dysfunction: This has been managed with Viagra 100 mg.     Interval history: At his last visit he describes significant dysuria but no change in his urinary stream.    He was He also describes having fairly significant dysuria as if a glass rod was broken in his urethra. He said despite this he voids with a good stream and is coherent. When he had the gross hematuria he said his INR was checked and found to be within the normal range. He has no history of UTIs or prostatitis. He is extremely frightened about the prospect of having cancer. His gross hematuria would be considered a moderate severity with no modifying factors.        IPSS 01/10/14 - 5/mixed   Past Medical History Problems  1. History of Anxiety (300.00) 2. History of asthma (V12.69) 3. History of atrial fibrillation (V12.59) 4. History of cardiac disorder (V12.50) 5. History of cardiac murmur (V12.59) 6. History of depression (V11.8)  Surgical History Problems  1. History of No Surgical Problems  Current Meds 1. Diltiazem HCl ER Beads 360 MG Oral Capsule Extended Release 24 Hour;  Therapy: 23Sep2014 to Recorded 2. Furosemide 20 MG Oral Tablet;  Therapy: [id number] to Recorded 3. Klor-Con M20 20 MEQ Oral Tablet Extended Release;  Therapy: 484 [id number] 7202 to Recorded 4. Metoprolol Succinate ER 25 MG Oral Tablet Extended Release 24 Hour;  Therapy: 04Aug2014 to Recorded 5. Warfarin Sodium 2 MG Oral Tablet;  Therapy: [id number] to Recorded 6. Zyrtec 10 MG TABS;  Therapy: (Recorded:17Mar2015) to Recorded  Allergies Medication  1. No Known Drug Allergies  Family History Problems  1. Family history of Colon cancer : Mother, Brother 2. Family history of  cardiac disorder (V17.49) : Father 3. Family history of kidney stones (V18.69) : Son  Social History Problems  1. Alcohol use (V49.89)   occasional use 2. Denied: History of Caffeine use 3. Father deceased 92. Former smoker (V15.82) 5. Married 6. Mother deceased 71. Number of children   3 sons 59. Retired  Review of Systems Genitourinary, constitutional, skin, eye, otolaryngeal, hematologic/lymphatic, cardiovascular, pulmonary, endocrine, musculoskeletal, gastrointestinal, neurological and psychiatric system(s) were reviewed and pertinent findings if present are noted.  Genitourinary: dysuria, nocturia, hematuria, erectile dysfunction and penile pain.  Gastrointestinal: constipation.  Constitutional: feeling tired (fatigue).  Integumentary: pruritus.  ENT: sinus problems.  Hematologic/Lymphatic: a tendency to easily bruise.  Cardiovascular: chest pain.  Musculoskeletal: back pain.  Psychiatric: anxiety and depression.    Vitals Vital Signs Height: 5 ft 11 in Weight: 220 lb  BMI Calculated: 30.68 BSA Calculated: 2.2 Blood Pressure: 132 / 79 Heart Rate: 83  Physical Exam Constitutional: Well nourished and well developed . No acute distress.  ENT:. The ears and nose are normal in appearance.  Neck: The appearance of the neck is normal and no neck mass is present.  Pulmonary: No respiratory distress and normal respiratory rhythm and effort.  Cardiovascular: Heart rate and rhythm are normal . No peripheral edema.  Abdomen: The abdomen is soft and nontender. No masses are palpated. No CVA tenderness. No hernias are palpable. No hepatosplenomegaly noted.  Rectal: Rectal exam demonstrates normal sphincter tone, no tenderness and no masses. Estimated prostate size  is 1+. Normal rectal tone, no rectal masses, prostate is smooth, symmetric and non-tender. The prostate has no nodularity and is not tender. The left seminal vesicle is nonpalpable. The right seminal vesicle is  nonpalpable. The perineum is normal on inspection.  Genitourinary: Examination of the penis demonstrates no discharge, no masses, no lesions and a normal meatus. The scrotum is without lesions. The right epididymis is palpably normal and non-tender. The left epididymis is palpably normal and non-tender. The right testis is non-tender and without masses. The left testis is non-tender and without masses.  Lymphatics: The femoral and inguinal nodes are not enlarged or tender.  Skin: Normal skin turgor, no visible rash and no visible skin lesions.  Neuro/Psych:. Mood and affect are appropriate.    Test Name Result Flag Reference AU CT-HEMATURIA PROTOCOL (Report)   ** RADIOLOGY REPORT BY [facility] **   CLINICAL DATA: Gross and microhematuria.  EXAM: CT ABDOMEN AND PELVIS WITHOUT AND WITH CONTRAST  TECHNIQUE: Multidetector CT imaging of the abdomen and pelvis was performed without contrast material in one or both body regions, followed by contrast material(s) and further sections in one or both body regions.  CONTRAST: 125 cc Isovue 300 IV  COMPARISON: None.  FINDINGS: Precontrast images demonstrate no renal or ureteral stones. No hydronephrosis.  Post-contrast images demonstrate symmetric enhancement of the kidneys bilaterally without focal abnormality. Prone delayed images show normal caliber and appearance of the ureters without focal abnormality. There is a focal area of abnormal enhancement along the left lateral wall of the bladder on the portal venous phase imaging (series 3, image 76). This area is approximately 1.7 cm in greatest diameter. This area appears as mild wall thickening on the delayed images through the bladder. This is concerning for possible tumor. No hydronephrosis.  Liver, gallbladder, spleen, pancreas, adrenals are unremarkable. Stomach, stomach, large and small bowel are unremarkable. Appendix is normal. No free fluid, free air or  adenopathy.  Bilateral inguinal hernias containing fat.  No acute or focal bony abnormality. Degenerative changes in the lower lumbar spine.  Review of the lung bases demonstrates linear subpleural densities, left greater than right, likely scarring. No pleural effusions. Heart is enlarged.  IMPRESSION: An area of abnormal enhancement along the left bladder wall concerning for bladder cancer. Recommend direct visualization and tissue sampling with cystoscopy. No focal renal abnormality or hydronephrosis.  Bilateral inguinal hernias containing fat.  Cardiomegaly.   Electronically Signed  By: Rolm Baptise M.D.  On: 01/17/2014 11:05  Procedure  Procedure: Cystoscopy done on 01/25/14  Indication: Hematuria.  Informed Consent: Risks, benefits, and potential adverse events were discussed and informed consent was obtained from the patient.  Prep: The patient was prepped with betadine.  Procedure Note:  Urethral meatus:. No abnormalities.  Anterior urethra: No abnormalities.  Prostatic urethra: No abnormalities.  Bladder: Visulization was clear. The ureteral orifices were in the normal anatomic position bilaterally and had clear efflux of urine. A systematic survey of the bladder demonstrated no bladder tumors or stones. The mucosa was smooth without abnormalities. The patient tolerated the procedure well.  Complications: None.    Assessment Assessed   I went over the results of the CT scan as well as my cystoscopic findings today which have revealed a bladder mass consistent with transitional cell carcinoma. We discussed the fact that currently there is no evidence of extravesical extension or pelvic adenopathy based on CT scan findings. Further characterization of the lesion is required for grading and staging purposes. We discussed  proceeding with evaluation using transurethral resection of the lesion. I have discussed the procedure in detail as well as the potential risks and  complications associated with this form of surgery. We also discussed the probability of successful resection of the intravesical portion of this lesion. I have recommended, as long as there is no contraindication at the time of surgery, the placement of intravesical mitomycin-C in order to reduce the risk of recurrence. We did discuss the potential side effects of this form of intravesical chemotherapy. The procedure will be performed under anesthesia as an outpatient.   Plan  He will be scheduled for transurethral resection of his bladder tumor and postoperative mitomycin-C.

## 2014-02-13 ENCOUNTER — Encounter (HOSPITAL_BASED_OUTPATIENT_CLINIC_OR_DEPARTMENT_OTHER): Payer: Self-pay | Admitting: Urology

## 2014-02-14 NOTE — Addendum Note (Signed)
Addendum created 02/14/14 1359 by Nickie Retort, MD   Modules edited: Anesthesia Responsible Staff

## 2014-02-20 ENCOUNTER — Other Ambulatory Visit: Payer: Self-pay | Admitting: Urology

## 2014-02-24 ENCOUNTER — Ambulatory Visit (INDEPENDENT_AMBULATORY_CARE_PROVIDER_SITE_OTHER): Payer: Medicare Other

## 2014-02-24 DIAGNOSIS — I4891 Unspecified atrial fibrillation: Secondary | ICD-10-CM

## 2014-02-24 DIAGNOSIS — Z5181 Encounter for therapeutic drug level monitoring: Secondary | ICD-10-CM

## 2014-02-24 DIAGNOSIS — I4892 Unspecified atrial flutter: Secondary | ICD-10-CM

## 2014-02-24 LAB — POCT INR: INR: 2.9

## 2014-03-16 ENCOUNTER — Other Ambulatory Visit: Payer: Self-pay | Admitting: Cardiovascular Disease

## 2014-03-27 ENCOUNTER — Other Ambulatory Visit: Payer: Self-pay | Admitting: Cardiovascular Disease

## 2014-03-30 ENCOUNTER — Encounter (HOSPITAL_BASED_OUTPATIENT_CLINIC_OR_DEPARTMENT_OTHER): Payer: Self-pay | Admitting: *Deleted

## 2014-03-31 ENCOUNTER — Encounter (HOSPITAL_BASED_OUTPATIENT_CLINIC_OR_DEPARTMENT_OTHER): Payer: Self-pay | Admitting: *Deleted

## 2014-03-31 NOTE — Progress Notes (Addendum)
NPO AFTER MN. ARRIVE AT [id number].  NEEDS PT/INR, BMET,  AND CBC.  CURRENT EKG IN CHART AND EPIC. WILL TAKE DILTIAZEM AM DOS W/ SIPS OF WATER.

## 2014-04-07 ENCOUNTER — Encounter (HOSPITAL_BASED_OUTPATIENT_CLINIC_OR_DEPARTMENT_OTHER): Payer: Self-pay

## 2014-04-07 ENCOUNTER — Encounter (HOSPITAL_BASED_OUTPATIENT_CLINIC_OR_DEPARTMENT_OTHER): Payer: Medicare Other | Admitting: Anesthesiology

## 2014-04-07 ENCOUNTER — Ambulatory Visit (HOSPITAL_BASED_OUTPATIENT_CLINIC_OR_DEPARTMENT_OTHER)
Admission: RE | Admit: 2014-04-07 | Discharge: 2014-04-07 | Disposition: A | Discharge status: Home / Self Care | Payer: Medicare Other | Source: Ambulatory Visit | Attending: Urology | Admitting: Urology

## 2014-04-07 ENCOUNTER — Encounter (HOSPITAL_BASED_OUTPATIENT_CLINIC_OR_DEPARTMENT_OTHER)
Admission: RE | Disposition: A | Discharge status: Home / Self Care | Payer: Self-pay | Source: Ambulatory Visit | Attending: Urology

## 2014-04-07 ENCOUNTER — Ambulatory Visit (HOSPITAL_BASED_OUTPATIENT_CLINIC_OR_DEPARTMENT_OTHER): Payer: Medicare Other | Admitting: Anesthesiology

## 2014-04-07 DIAGNOSIS — F411 Generalized anxiety disorder: Secondary | ICD-10-CM | POA: Insufficient documentation

## 2014-04-07 DIAGNOSIS — I4891 Unspecified atrial fibrillation: Secondary | ICD-10-CM | POA: Insufficient documentation

## 2014-04-07 DIAGNOSIS — Z87891 Personal history of nicotine dependence: Secondary | ICD-10-CM | POA: Insufficient documentation

## 2014-04-07 DIAGNOSIS — Z7901 Long term (current) use of anticoagulants: Secondary | ICD-10-CM | POA: Insufficient documentation

## 2014-04-07 DIAGNOSIS — F3289 Other specified depressive episodes: Secondary | ICD-10-CM | POA: Insufficient documentation

## 2014-04-07 DIAGNOSIS — I739 Peripheral vascular disease, unspecified: Secondary | ICD-10-CM | POA: Insufficient documentation

## 2014-04-07 DIAGNOSIS — C674 Malignant neoplasm of posterior wall of bladder: Secondary | ICD-10-CM | POA: Insufficient documentation

## 2014-04-07 DIAGNOSIS — F329 Major depressive disorder, single episode, unspecified: Secondary | ICD-10-CM | POA: Insufficient documentation

## 2014-04-07 DIAGNOSIS — D494 Neoplasm of unspecified behavior of bladder: Secondary | ICD-10-CM

## 2014-04-07 DIAGNOSIS — I10 Essential (primary) hypertension: Secondary | ICD-10-CM | POA: Insufficient documentation

## 2014-04-07 HISTORY — DX: Other specified postprocedural states: Z98.890

## 2014-04-07 HISTORY — DX: Malignant neoplasm of bladder, unspecified: C67.9

## 2014-04-07 HISTORY — DX: Other specified postprocedural states: R11.2

## 2014-04-07 HISTORY — PX: TRANSURETHRAL RESECTION OF BLADDER TUMOR: SHX2575

## 2014-04-07 LAB — BASIC METABOLIC PANEL
BUN: 21 mg/dL (ref 6–23)
CALCIUM: 9.1 mg/dL (ref 8.4–10.5)
CHLORIDE: 102 meq/L (ref 96–112)
CO2: 25 mEq/L (ref 19–32)
CREATININE: 1 mg/dL (ref 0.50–1.35)
GFR calc non Af Amer: 73 mL/min — ABNORMAL LOW (ref >90)
GFR, EST AFRICAN AMERICAN: 84 mL/min — AB (ref >90)
Glucose, Bld: 105 mg/dL — ABNORMAL HIGH (ref 70–99)
Potassium: 3.7 mEq/L (ref 3.7–5.3)
Sodium: 141 mEq/L (ref 137–147)

## 2014-04-07 LAB — PROTIME-INR
INR: 1.12 (ref 0.00–1.49)
Prothrombin Time: 14.2 seconds (ref 11.6–15.2)

## 2014-04-07 LAB — CBC
HEMATOCRIT: 44.4 % (ref 39.0–52.0)
Hemoglobin: 14.6 g/dL (ref 13.0–17.0)
MCH: 30.9 pg (ref 26.0–34.0)
MCHC: 32.9 g/dL (ref 30.0–36.0)
MCV: 94.1 fL (ref 78.0–100.0)
PLATELETS: 166 10*3/uL (ref 150–400)
RBC: 4.72 MIL/uL (ref 4.22–5.81)
RDW: 14.7 % (ref 11.5–15.5)
WBC: 8.6 10*3/uL (ref 4.0–10.5)

## 2014-04-07 SURGERY — TURBT (TRANSURETHRAL RESECTION OF BLADDER TUMOR)
Anesthesia: General | Site: Bladder

## 2014-04-07 MED ORDER — PHENAZOPYRIDINE HCL 200 MG PO TABS: 200.0000 mg | ORAL_TABLET | Freq: Three times a day (TID) | ORAL | Status: DC | PRN

## 2014-04-07 MED ORDER — HYDROCODONE-ACETAMINOPHEN 10-325 MG PO TABS: 1.0000 | ORAL_TABLET | ORAL | Status: DC | PRN

## 2014-04-07 MED ORDER — FENTANYL CITRATE 0.05 MG/ML IJ SOLN
INTRAMUSCULAR | Status: DC | PRN
  Administered 2014-04-07 (×2): 50 ug via INTRAVENOUS

## 2014-04-07 MED ORDER — SCOPOLAMINE 1 MG/3DAYS TD PT72
MEDICATED_PATCH | TRANSDERMAL | Status: AC
  Filled 2014-04-07: qty 1

## 2014-04-07 MED ORDER — CIPROFLOXACIN IN D5W 200 MG/100ML IV SOLN
200.0000 mg | INTRAVENOUS | Status: AC
  Administered 2014-04-07: 200 mg via INTRAVENOUS
  Filled 2014-04-07: qty 100

## 2014-04-07 MED ORDER — ONDANSETRON HCL 4 MG/2ML IJ SOLN
INTRAMUSCULAR | Status: DC | PRN
  Administered 2014-04-07: 4 mg via INTRAVENOUS

## 2014-04-07 MED ORDER — PHENAZOPYRIDINE HCL 200 MG PO TABS
200.0000 mg | ORAL_TABLET | Freq: Three times a day (TID) | ORAL | Status: DC
  Administered 2014-04-07: 200 mg via ORAL
  Filled 2014-04-07: qty 1

## 2014-04-07 MED ORDER — LACTATED RINGERS IV SOLN
INTRAVENOUS | Status: DC
  Administered 2014-04-07 (×2): via INTRAVENOUS
  Filled 2014-04-07: qty 1000

## 2014-04-07 MED ORDER — SODIUM CHLORIDE 0.9 % IR SOLN
Status: DC | PRN
  Administered 2014-04-07: 6000 mL via INTRAVESICAL

## 2014-04-07 MED ORDER — FENTANYL CITRATE 0.05 MG/ML IJ SOLN
INTRAMUSCULAR | Status: AC
  Filled 2014-04-07: qty 4

## 2014-04-07 MED ORDER — LIDOCAINE HCL (CARDIAC) 20 MG/ML IV SOLN
INTRAVENOUS | Status: DC | PRN
  Administered 2014-04-07: 80 mg via INTRAVENOUS

## 2014-04-07 MED ORDER — 0.9 % SODIUM CHLORIDE (POUR BTL) OPTIME
TOPICAL | Status: DC | PRN
  Administered 2014-04-07: 1000 mL

## 2014-04-07 MED ORDER — DEXAMETHASONE SODIUM PHOSPHATE 4 MG/ML IJ SOLN
INTRAMUSCULAR | Status: DC | PRN
  Administered 2014-04-07: 10 mg via INTRAVENOUS

## 2014-04-07 MED ORDER — KETOROLAC TROMETHAMINE 30 MG/ML IJ SOLN
15.0000 mg | Freq: Once | INTRAMUSCULAR | Status: DC | PRN
  Filled 2014-04-07: qty 1

## 2014-04-07 MED ORDER — PHENAZOPYRIDINE HCL 100 MG PO TABS
ORAL_TABLET | ORAL | Status: AC
  Filled 2014-04-07: qty 2

## 2014-04-07 MED ORDER — PROMETHAZINE HCL 25 MG/ML IJ SOLN
6.2500 mg | INTRAMUSCULAR | Status: DC | PRN
  Filled 2014-04-07: qty 1

## 2014-04-07 MED ORDER — SCOPOLAMINE 1 MG/3DAYS TD PT72
1.0000 | MEDICATED_PATCH | TRANSDERMAL | Status: DC
  Administered 2014-04-07: 1.5 mg via TRANSDERMAL
  Filled 2014-04-07: qty 1

## 2014-04-07 MED ORDER — MIDAZOLAM HCL 2 MG/2ML IJ SOLN
INTRAMUSCULAR | Status: AC
  Filled 2014-04-07: qty 2

## 2014-04-07 MED ORDER — PROPOFOL 10 MG/ML IV BOLUS
INTRAVENOUS | Status: DC | PRN
  Administered 2014-04-07: 200 mg via INTRAVENOUS
  Administered 2014-04-07: 20 mg via INTRAVENOUS

## 2014-04-07 MED ORDER — FENTANYL CITRATE 0.05 MG/ML IJ SOLN
25.0000 ug | INTRAMUSCULAR | Status: DC | PRN
Start: 2014-04-07 — End: 2014-04-07
  Filled 2014-04-07: qty 1

## 2014-04-07 MED ORDER — LIDOCAINE HCL 2 % EX GEL
CUTANEOUS | Status: DC | PRN
  Administered 2014-04-07: 1 via URETHRAL

## 2014-04-07 SURGICAL SUPPLY — 34 items
BAG DRAIN URO-CYSTO SKYTR STRL (DRAIN) ×2 IMPLANT
BAG URINE DRAINAGE (UROLOGICAL SUPPLIES) IMPLANT
BAG URINE LEG 19OZ MD ST LTX (BAG) IMPLANT
CANISTER SUCT LVC 12 LTR MEDI- (MISCELLANEOUS) ×2 IMPLANT
CATH FOLEY 2WAY SLVR  5CC 20FR (CATHETERS)
CATH FOLEY 2WAY SLVR  5CC 22FR (CATHETERS)
CATH FOLEY 2WAY SLVR  5CC 24FR (CATHETERS) ×1
CATH FOLEY 2WAY SLVR 5CC 20FR (CATHETERS) IMPLANT
CATH FOLEY 2WAY SLVR 5CC 22FR (CATHETERS) IMPLANT
CATH FOLEY 2WAY SLVR 5CC 24FR (CATHETERS) ×1 IMPLANT
CATH FOLEY 3WAY 20FR (CATHETERS) IMPLANT
CLOTH BEACON ORANGE TIMEOUT ST (SAFETY) ×2 IMPLANT
DRAPE CAMERA CLOSED 9X96 (DRAPES) ×2 IMPLANT
ELECT BUTTON HF 24-28F 2 30DE (ELECTRODE) IMPLANT
ELECT LOOP MED HF 24F 12D (CUTTING LOOP) IMPLANT
ELECT LOOP MED HF 24F 12D CBL (CLIP) ×2 IMPLANT
ELECT REM PT RETURN 9FT ADLT (ELECTROSURGICAL)
ELECT RESECT VAPORIZE 12D CBL (ELECTRODE) IMPLANT
ELECTRODE REM PT RTRN 9FT ADLT (ELECTROSURGICAL) IMPLANT
EVACUATOR MICROVAS BLADDER (UROLOGICAL SUPPLIES) ×2 IMPLANT
GLOVE BIO SURGEON STRL SZ8 (GLOVE) ×2 IMPLANT
GLOVE BIOGEL M 6.5 STRL (GLOVE) ×2 IMPLANT
GLOVE INDICATOR 6.5 STRL GRN (GLOVE) ×2 IMPLANT
GOWN PREVENTION PLUS LG XLONG (DISPOSABLE) IMPLANT
GOWN STRL REIN XL XLG (GOWN DISPOSABLE) IMPLANT
GOWN STRL REUS W/TWL LRG LVL3 (GOWN DISPOSABLE) ×2 IMPLANT
GOWN STRL REUS W/TWL XL LVL3 (GOWN DISPOSABLE) ×2 IMPLANT
HOLDER FOLEY CATH W/STRAP (MISCELLANEOUS) IMPLANT
IV NS IRRIG 3000ML ARTHROMATIC (IV SOLUTION) ×4 IMPLANT
NS IRRIG 1000ML POUR BTL (IV SOLUTION) ×2 IMPLANT
PACK CYSTOSCOPY (CUSTOM PROCEDURE TRAY) ×2 IMPLANT
PLUG CATH AND CAP STER (CATHETERS) IMPLANT
SET ASPIRATION TUBING (TUBING) ×2 IMPLANT
WATER STERILE IRR 3000ML UROMA (IV SOLUTION) IMPLANT

## 2014-04-07 NOTE — Transfer of Care (Signed)
Immediate Anesthesia Transfer of Care Note  Patient: Bradley Winters  Procedure(s) Performed: Procedure(s): TRANSURETHRAL RESECTION OF BLADDER TUMOR (TURBT) WITH BLADDER BIOPSY (N/A)  Patient Location: PACU  Anesthesia Type:General  Level of Consciousness: awake and sedated  Airway & Oxygen Therapy: Patient Spontanous Breathing and Patient connected to face mask oxygen  Post-op Assessment: Report given to PACU RN and Post -op Vital signs reviewed and stable  Post vital signs: Reviewed and stable  Complications: No apparent anesthesia complications

## 2014-04-07 NOTE — Discharge Instructions (Signed)
Post Bladder Surgery Instructions ° ° °General instructions: °   ° Your recent bladder surgery requires very little post hospital care but some definite precautions. ° °Despite the fact that no skin incisions were used, the area around the bladder incisions are raw and covered with scabs to promote healing and prevent bleeding. Certain precautions are needed to insure that the scabs are not disturbed over the next 2-4 weeks while the healing proceeds. ° °Because the raw surface inside your bladder and the irritating effects of urine you may expect frequency of urination and/or urgency (a stronger desire to urinate) and perhaps even getting up at night more often. This will usually resolve or improve slowly over the healing period. You may see some blood in your urine over the first 6 weeks. Do not be alarmed, even if the urine was clear for a while. Get off your feet and drink lots of fluids until clearing occurs. If you start to pass clots or don't improve call us. ° °Catheter: (If you are discharged with a catheter.) ° °1. Keep your catheter secured to your leg at all times with tape or the supplied strap. °2. You may experience leakage of urine around your catheter- as long as the  °catheter continues to drain, this is normal.  If your catheter stops draining  °go to the ER. °3. You may also have blood in your urine, even after it has been clear for  °several days; you may even pass some small blood clots or other material.  This  °is normal as well.  If this happens, sit down and drink plenty of water to help  °make urine to flush out your bladder.  If the blood in your urine becomes worse  °after doing this, contact our office or return to the ER. °4. You may use the leg bag (small bag) during the day, but use the large bag at  °night. ° °Diet: ° °You may return to your normal diet immediately. Because of the raw surface of your bladder, alcohol, spicy foods, foods high in acid and drinks with caffeine may  cause irritation or frequency and should be used in moderation. To keep your urine flowing freely and avoid constipation, drink plenty of fluids during the day (8-10 glasses). Tip: Avoid cranberry juice because it is very acidic. ° °Activity: ° °Your physical activity doesn't need to be restricted. However, if you are very active, you may see some blood in the urine. We suggest that you reduce your activity under the circumstances until the bleeding has stopped. ° °Bowels: ° °It is important to keep your bowels regular during the postoperative period. Straining with bowel movements can cause bleeding. A bowel movement every other day is reasonable. Use a mild laxative if needed, such as milk of magnesia 2-3 tablespoons, or 2 Dulcolax tablets. Call if you continue to have problems. If you had been taking narcotics for pain, before, during or after your surgery, you may be constipated. Take a laxative if necessary. ° ° ° °Medication: ° °You should resume your pre-surgery medications unless told not to. In addition you may be given an antibiotic to prevent or treat infection. Antibiotics are not always necessary. All medication should be taken as prescribed until the bottles are finished unless you are having an unusual reaction to one of the drugs. ° ° °Post Anesthesia Home Care Instructions ° °Activity: °Get plenty of rest for the remainder of the day. A responsible adult should stay with you for   24 hours following the procedure.  °For the next 24 hours, DO NOT: °-Drive a car °-Operate machinery °-Drink alcoholic beverages °-Take any medication unless instructed by your physician °-Make any legal decisions or sign important papers. ° °Meals: °Start with liquid foods such as gelatin or soup. Progress to regular foods as tolerated. Avoid greasy, spicy, heavy foods. If nausea and/or vomiting occur, drink only clear liquids until the nausea and/or vomiting subsides. Call your physician if vomiting continues. ° °Special  Instructions/Symptoms: °Your throat may feel dry or sore from the anesthesia or the breathing tube placed in your throat during surgery. If this causes discomfort, gargle with warm salt water. The discomfort should disappear within 24 hours. ° ° °

## 2014-04-07 NOTE — Op Note (Signed)
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 is a 73 year old male patient who underwent TURBT on 02/10/14. He had a large tumor that was pathologically Ta-T1, G3 and was treated with postoperative mitomycin C. He is brought back to the operating room for repeat biopsy.  Description of operation: The patient was taken to the operating room and administered general anesthesia. He was then placed on the table and moved to the dorsal lithotomy position after which his genitalia was sterilely prepped and draped. An official timeout was then performed.  Initially the 27 French cystoscope was passed under direct vision using the 12 lens. The urethra and prostate were noted to be unremarkable. The bladder was entered and fully inspected with both the 12 and 70 lenses. A single area of friable necrotic-appearing material was noted on the posterior left wall of the bladder where he previously underwent resection. No other worrisome lesions were noted within the bladder. Ureteral orifices were noted to be of normal configuration and position. I used the cold cup biopsies to obtain a biopsy from the mucosa surrounding the area of previous resection. I then removed the cystoscope.  The 60 French resectoscope with visual obturator was then introduced into the bladder and the obturator was removed. The resectoscope element with 12 lens was then inserted.   I first began by using the loop to scrape away all of the necrotic-appearing, superficial material and flushed this from the bladder. I then  resected through the previous site of resection obtaining material outside of the area of resection and then down through this area into what was felt to be the detrusor muscle to obtain deeper biopsy specimens. I then fulgurated a few small bleeding points. The Microvasive evacuator was then used to irrigate the bladder and remove all of the portions of the resectional biopsy that were produced and these were sent to pathology. I then removed the resectoscope. The patient tolerated the procedure well with no intraoperative complications.   PLAN OF CARE: Discharge to home after PACU  PATIENT DISPOSITION:  PACU - hemodynamically stable.

## 2014-04-07 NOTE — Anesthesia Postprocedure Evaluation (Signed)
  Anesthesia Post-op Note  Patient: [facility]  Procedure(s) Performed: Procedure(s) (LRB): TRANSURETHRAL  BLADDER BIOPSY (N/A)  Patient Location: PACU  Anesthesia Type: General  Level of Consciousness: awake and alert   Airway and Oxygen Therapy: Patient Spontanous Breathing  Post-op Pain: mild  Post-op Assessment: Post-op Vital signs reviewed, Patient's Cardiovascular Status Stable, Respiratory Function Stable, Patent Airway and No signs of Nausea or vomiting  Last Vitals:  Filed Vitals:   04/07/14 0830  BP: 124/83  Pulse: 128  Temp:   Resp: 13    Post-op Vital Signs: stable   Complications: No apparent anesthesia complications

## 2014-04-07 NOTE — H&P (Signed)
History of Present Illness Transitional cell carcinoma of the bladder: He experienced gross hematuria beginning in 6/14. He was seen by me in 3/15 a CT scan was obtained that revealed normal upper tract. He was found to have a fairly large tumor involving the left posterior wall of his bladder as well as the posterior wall just to the right of the midline.  TURBT 02/10/14 - He was treated postoperatively with mitomycin-C.   Pathology: High-grade transitional cell carcinoma with what was described as areas suspicious for superficial invasion. (Ta-T1,G3)    Erectile dysfunction: This has been managed with Viagra 100 mg.     Interval history: He did pretty well after the surgery. He does still experience some dysuria especially at the termination of his stream. Otherwise he did very well after the surgery.         IPSS 01/10/14 - 5/mixed   Past Medical History Problems  1. History of Anxiety (300.00) 2. History of asthma (V12.69) 3. History of atrial fibrillation (V12.59) 4. History of cardiac disorder (V12.50) 5. History of cardiac murmur (V12.59) 6. History of depression (V11.8)  Surgical History Problems  1. History of No Surgical Problems  Current Meds 1. Diltiazem HCl ER Beads 360 MG Oral Capsule Extended Release 24 Hour;  Therapy: [id number] to Recorded 2. Furosemide 20 MG Oral Tablet;  Therapy: [id number] to Recorded 3. Klor-Con M20 20 MEQ Oral Tablet Extended Release;  Therapy: [phone] to Recorded 4. Metoprolol Succinate ER 25 MG Oral Tablet Extended Release 24 Hour;  Therapy: 04Aug2014 to Recorded 5. Warfarin Sodium 2 MG Oral Tablet;  Therapy: [id number] to Recorded 6. Zyrtec 10 MG TABS;  Therapy: (Recorded:17Mar2015) to Recorded  Allergies Medication  1. No Known Drug Allergies  Family History Problems  1. Family history of Colon cancer : Mother, Brother 2. Family history of cardiac disorder (V17.49) : Father 3. Family history of kidney stones (V18.69) :  Son  Social History Problems  1. Alcohol use (V49.89)   occasional use 2. Denied: History of Caffeine use 3. Father deceased 81. Former smoker (V15.82) 5. Married 6. Mother deceased 96. Number of children   3 sons 26. Retired  Review of Systems Genitourinary, constitutional, skin, eye, otolaryngeal, hematologic/lymphatic, cardiovascular, pulmonary, endocrine, musculoskeletal, gastrointestinal, neurological and psychiatric system(s) were reviewed and pertinent findings if present are noted.  Genitourinary: dysuria, nocturia, hematuria, erectile dysfunction and penile pain.  Gastrointestinal: constipation.  Constitutional: feeling tired (fatigue).  Integumentary: pruritus.  ENT: sinus problems.  Hematologic/Lymphatic: a tendency to easily bruise.  Cardiovascular: chest pain.  Musculoskeletal: back pain.  Psychiatric: anxiety and depression.    Vitals Vital Signs  Height: 5 ft 11 in Weight: 222 lb  BMI Calculated: 30.96 BSA Calculated: 2.2 Blood Pressure: 136 / 61 Heart Rate: 67  Physical Exam Constitutional: Well nourished and well developed . No acute distress.  ENT:. The ears and nose are normal in appearance.  Neck: The appearance of the neck is normal and no neck mass is present.  Pulmonary: No respiratory distress and normal respiratory rhythm and effort.  Cardiovascular: Heart rate and rhythm are normal . No peripheral edema.  Abdomen: The abdomen is soft and nontender. No masses are palpated. No CVA tenderness. No hernias are palpable. No hepatosplenomegaly noted.  Rectal: Rectal exam demonstrates normal sphincter tone, no tenderness and no masses. Estimated prostate size is 1+. Normal rectal tone, no rectal masses, prostate is smooth, symmetric and non-tender. The prostate has no nodularity and is not tender. The left seminal vesicle is  nonpalpable. The right seminal vesicle is nonpalpable. The perineum is normal on inspection.  Genitourinary: Examination of the penis  demonstrates no discharge, no masses, no lesions and a normal meatus. The scrotum is without lesions. The right epididymis is palpably normal and non-tender. The left epididymis is palpably normal and non-tender. The right testis is non-tender and without masses. The left testis is non-tender and without masses.  Lymphatics: The femoral and inguinal nodes are not enlarged or tender.  Skin: Normal skin turgor, no visible rash and no visible skin lesions.  Neuro/Psych:. Mood and affect are appropriate.    Assessment   I went over his pathology report with him. It has revealed malignant cancer of the bladder that incised areas appeared to have invaded the lamina propria. In addition was high-grade. Because of these findings and my recommendation based on current guidelines is to allow his bladder to heal from this surgery and then perform further resection obtaining muscle to rule out muscle invasive disease and be sure his bladder has been completely cleared of all visible tumor. I told him that I needed to allow his bladder to heal up enough that I would be able to tell the difference between the inflammation associated with a recent resection and residual tumor. I therefore have recommended we wait 6-8 weeks before his repeat surgery. We then discussed the fact that he would need to be treated, if he has not been found to have muscle invasive disease, with intravesical immunotherapy using BCG. I went over the nature of this therapy as well as its risks and complications and the alternatives.    Plan  He will be scheduled for repeat transurethral resectional biopsies of his bladder.

## 2014-04-07 NOTE — Anesthesia Procedure Notes (Signed)
Procedure Name: LMA Insertion Date/Time: 04/07/2014 7:26 AM Performed by: Lyndee Leo Pre-anesthesia Checklist: Patient identified, Emergency Drugs available, Suction available and Patient being monitored Patient Re-evaluated:Patient Re-evaluated prior to inductionOxygen Delivery Method: Circle System Utilized Preoxygenation: Pre-oxygenation with 100% oxygen Intubation Type: IV induction Ventilation: Mask ventilation without difficulty LMA: LMA inserted LMA Size: 5.0 Number of attempts: 1 Airway Equipment and Method: bite block Placement Confirmation: positive ETCO2 Tube secured with: Tape Dental Injury: Teeth and Oropharynx as per pre-operative assessment

## 2014-04-07 NOTE — Anesthesia Preprocedure Evaluation (Signed)
Anesthesia Evaluation  Patient identified by MRN, date of birth, ID band Patient awake    Reviewed: Allergy & Precautions, H&P , NPO status , Patient's Chart, lab work & pertinent test results  Airway Mallampati: II TM Distance: >3 FB Neck ROM: Full    Dental no notable dental hx.    Pulmonary neg pulmonary ROS, former smoker,  breath sounds clear to auscultation  Pulmonary exam normal       Cardiovascular hypertension, Pt. on medications + Peripheral Vascular Disease + dysrhythmias Atrial Fibrillation Rhythm:Irregular Rate:Normal     Neuro/Psych negative neurological ROS  negative psych ROS   GI/Hepatic negative GI ROS, Neg liver ROS,   Endo/Other  negative endocrine ROS  Renal/GU negative Renal ROS  negative genitourinary   Musculoskeletal negative musculoskeletal ROS (+)   Abdominal   Peds negative pediatric ROS (+)  Hematology negative hematology ROS (+)   Anesthesia Other Findings   Reproductive/Obstetrics negative OB ROS                           Anesthesia Physical Anesthesia Plan  ASA: III  Anesthesia Plan: General   Post-op Pain Management:    Induction: Intravenous  Airway Management Planned: LMA  Additional Equipment:   Intra-op Plan:   Post-operative Plan: Extubation in OR  Informed Consent: I have reviewed the patients History and Physical, chart, labs and discussed the procedure including the risks, benefits and alternatives for the proposed anesthesia with the patient or authorized representative who has indicated his/her understanding and acceptance.   Dental advisory given  Plan Discussed with: CRNA and Surgeon  Anesthesia Plan Comments:         Anesthesia Quick Evaluation

## 2014-04-10 ENCOUNTER — Encounter (HOSPITAL_BASED_OUTPATIENT_CLINIC_OR_DEPARTMENT_OTHER): Payer: Self-pay | Admitting: Urology

## 2014-04-14 ENCOUNTER — Other Ambulatory Visit: Payer: Self-pay

## 2014-04-14 MED ORDER — FUROSEMIDE 20 MG PO TABS: ORAL_TABLET | ORAL | Status: DC

## 2014-04-15 ENCOUNTER — Other Ambulatory Visit: Payer: Self-pay | Admitting: Cardiovascular Disease

## 2014-05-17 ENCOUNTER — Other Ambulatory Visit: Payer: Self-pay | Admitting: Cardiovascular Disease

## 2014-05-18 ENCOUNTER — Ambulatory Visit (INDEPENDENT_AMBULATORY_CARE_PROVIDER_SITE_OTHER): Payer: Medicare Other | Admitting: *Deleted

## 2014-05-18 DIAGNOSIS — I4892 Unspecified atrial flutter: Secondary | ICD-10-CM

## 2014-05-18 DIAGNOSIS — I4891 Unspecified atrial fibrillation: Secondary | ICD-10-CM

## 2014-05-18 DIAGNOSIS — Z5181 Encounter for therapeutic drug level monitoring: Secondary | ICD-10-CM

## 2014-05-18 LAB — POCT INR: INR: 2.8

## 2014-05-18 NOTE — Telephone Encounter (Signed)
Refill done pt seen in clinic today

## 2014-06-02 ENCOUNTER — Ambulatory Visit (INDEPENDENT_AMBULATORY_CARE_PROVIDER_SITE_OTHER): Payer: Medicare Other | Admitting: Cardiovascular Disease

## 2014-06-02 ENCOUNTER — Other Ambulatory Visit: Payer: Medicare Other

## 2014-06-02 ENCOUNTER — Encounter: Payer: Self-pay | Admitting: Cardiovascular Disease

## 2014-06-02 VITALS — BP 140/84 | HR 93 | Ht 71.0 in | Wt 232.0 lb

## 2014-06-02 DIAGNOSIS — I4891 Unspecified atrial fibrillation: Secondary | ICD-10-CM

## 2014-06-02 DIAGNOSIS — R0602 Shortness of breath: Secondary | ICD-10-CM

## 2014-06-02 DIAGNOSIS — I482 Chronic atrial fibrillation, unspecified: Secondary | ICD-10-CM

## 2014-06-02 DIAGNOSIS — I421 Obstructive hypertrophic cardiomyopathy: Secondary | ICD-10-CM

## 2014-06-02 DIAGNOSIS — R609 Edema, unspecified: Secondary | ICD-10-CM

## 2014-06-02 DIAGNOSIS — E7800 Pure hypercholesterolemia, unspecified: Secondary | ICD-10-CM

## 2014-06-02 MED ORDER — FUROSEMIDE 40 MG PO TABS: 40.0000 mg | ORAL_TABLET | Freq: Every day | ORAL | Status: DC

## 2014-06-02 MED ORDER — POTASSIUM CHLORIDE CRYS ER 20 MEQ PO TBCR: 20.0000 meq | EXTENDED_RELEASE_TABLET | Freq: Every day | ORAL | Status: DC

## 2014-06-02 NOTE — Progress Notes (Signed)
HPI:  73 year old gentleman presenting for followup evaluation. He has hypertrophic cardiomyopathy and persistent atrial fibrillation. He was diagnosed with bladder cancer earlier this year and underwent resection by Dr Karsten Ro. He is doing well with no further hematuria.   From a cardiac perspective he is doing fairly well. He has shortness of breath only related to nasal congestion. He does a lot of walking and has no symptoms associated with exertion. He denies chest pain or pressure, DOE, or lightheadedness. No palpitations. He does complain of chronic leg swelling. He takes daily Lasix except for Tuesdays when he goes in for his bladder treatments.   Outpatient Encounter Prescriptions as of 06/02/2014  Medication Sig  . acetaminophen (TYLENOL) 325 MG tablet Take 650 mg by mouth every 6 (six) hours as needed.    . diltiazem (TIAZAC) 360 MG 24 hr capsule TAKE 1 CAPSULE (360 MG TOTAL) BY MOUTH DAILY.----  TAKES IN AM  . furosemide (LASIX) 20 MG tablet TAKE 1 TABLET EVERY DAY---  TAKES IN AM  . metoprolol succinate (TOPROL-XL) 25 MG 24 hr tablet TAKE 1 TABLET BY MOUTH EVERY DAY  . potassium chloride SA (KLOR-CON M20) 20 MEQ tablet TAKE 1/2 TABLET BY MOUTH DAILY--- TAKES IN AM  . sildenafil (VIAGRA) 50 MG tablet Take 1 tablet (50 mg total) by mouth daily as needed for erectile dysfunction.  [name] warfarin (COUMADIN) 2 MG tablet Take as directed by coumadin clinic  . [DISCONTINUED] HYDROcodone-acetaminophen (NORCO) 10-325 MG per tablet Take 1-2 tablets by mouth every 4 (four) hours as needed for moderate pain. Maximum dose per 24 hours - 8 pills  . [DISCONTINUED] oxyCODONE-acetaminophen (PERCOCET) 10-325 MG per tablet Take 1-2 tablets by mouth every 4 (four) hours as needed for pain. Maximum dose per 24 hours - 8 pills  . [DISCONTINUED] phenazopyridine (PYRIDIUM) 200 MG tablet Take 1 tablet (200 mg total) by mouth 3 (three) times daily as needed for pain.  . [DISCONTINUED] phenazopyridine (PYRIDIUM)  200 MG tablet Take 1 tablet (200 mg total) by mouth 3 (three) times daily as needed for pain.  . [DISCONTINUED] warfarin (COUMADIN) 2 MG tablet TAKE 1 TABLET BY MOUTH AS DIRECTED    No Known Allergies  Past Medical History  Diagnosis Date  . Hypertrophic cardiomyopathy     HEREDITARY--  POSITIVE for MYBPC3  GENE (HETEROZYGOUS)  . Persistent atrial fibrillation     CARDIOLOGIST-- DR [name]  AND DR [name]  . Venous insufficiency   . Thrombocytopenia PT ASYMPTOMATIC    MILD--  HEMOTOLOGIST--  DR [name]  . Hypertension   . ED (erectile dysfunction)   . History of colonoscopy with polypectomy     BENIGN  . History of seizures as a child     FEBRILE  . Anxiety     MOSTLY AT NIGHT  . History of gastric ulcer   . Wears glasses   . Bladder cancer   . PONV (postoperative nausea and vomiting)     ROS: Negative except as per HPI  BP 140/84  Pulse 93  Ht 5\' 11"  (1.803 m)  Wt 232 lb (105.235 kg)  BMI 32.37 kg/m2  PHYSICAL EXAM: Pt is alert and oriented, NAD HEENT: normal Neck: JVP - normal, carotids 2+= without bruits Lungs: CTA bilaterally CV: Irregularly irregular without murmur or gallop Abd: soft, NT, Positive BS, no hepatomegaly Ext: 2-3+ pretibial edema bilaterally with chronic stasis changes, distal pulses intact and equal Skin: warm/dry no rash  EKG:  Atrial fibrillation 93 beats per minute,  low-voltage QRS, ST and T wave abnormality consider inferolateral ischemia.  2D Echo 06/16/2013: Study Conclusions  - Left ventricle: The cavity size was normal. Wall thickness was increased in a pattern of moderate LVH. Systolic function was normal. The estimated ejection fraction was in the range of 55% to 60%. Wall motion was normal; there were no regional wall motion abnormalities. Doppler parameters are consistent with high ventricular filling pressure. - Mitral valve: There was mild systolic anterior motion of the anterior leaflet and posterior leaflet.  Mild regurgitation. - Left atrium: The atrium was severely dilated. - Right atrium: The atrium was moderately dilated. - Pulmonary arteries: Systolic pressure was mildly increased. PA peak pressure: 42mm Hg (S). Impressions:  - Patient with history of HCM; moderate LVH with mild SAM; no significant LVOT gradient at rest.  ASSESSMENT AND PLAN: 1. Permanent atrial fibrillation. Continue anticoagulation with warfarin. Heart rate control with metoprolol succinate and diltiazem. Echocardiogram from last year reviewed and he has marked biatrial enlargement. He seems to be doing well with the strategy of rate control and anticoagulation.  2. Hypertrophic cardiomyopathy. Stable without active symptoms of congestive heart failure. The continue treatment with diltiazem and metoprolol.  3. Chronic leg edema. His leg swelling is significant. I suspect this is all related to chronic venous insufficiency. We discussed the importance of leg elevation. He has not tolerated compression stockings. Will increase furosemide to 40 mg daily. Will increase his potassium supplementation. Repeat metabolic panel in 2 weeks.  I will see the patient back in 6 months for followup evaluation. He'll return in 2 weeks for lab work to include a metabolic panel, liver function tests, and a lipid panel.  [name] 06/02/2014 8:35 AM

## 2014-06-02 NOTE — Patient Instructions (Addendum)
Your physician has recommended you make the following change in your medication: INCREASE Furosemide to 40mg  daily, INCREASE Potassium Chloride to 73mEq daily  Your physician recommends that you return for a FASTING LIPID, LIVER, BMP and BNP in 2 WEEKS--nothing to eat or drink after midnight, lab opens at 7:30 am  Your physician wants you to follow-up in: 6 MONTHS with Dr [name].  You will receive a reminder letter in the mail two months in advance. If you don't receive a letter, please call our office to schedule the follow-up appointment.

## 2014-06-03 ENCOUNTER — Encounter: Payer: Self-pay | Admitting: Cardiovascular Disease

## 2014-06-20 ENCOUNTER — Other Ambulatory Visit (INDEPENDENT_AMBULATORY_CARE_PROVIDER_SITE_OTHER): Payer: Medicare Other

## 2014-06-20 DIAGNOSIS — I482 Chronic atrial fibrillation, unspecified: Secondary | ICD-10-CM

## 2014-06-20 DIAGNOSIS — I4892 Unspecified atrial flutter: Secondary | ICD-10-CM

## 2014-06-20 DIAGNOSIS — I4891 Unspecified atrial fibrillation: Secondary | ICD-10-CM

## 2014-06-20 DIAGNOSIS — R609 Edema, unspecified: Secondary | ICD-10-CM

## 2014-06-20 DIAGNOSIS — I421 Obstructive hypertrophic cardiomyopathy: Secondary | ICD-10-CM

## 2014-06-20 DIAGNOSIS — R0602 Shortness of breath: Secondary | ICD-10-CM

## 2014-06-20 DIAGNOSIS — E7800 Pure hypercholesterolemia, unspecified: Secondary | ICD-10-CM

## 2014-06-20 LAB — BASIC METABOLIC PANEL
BUN: 20 mg/dL (ref 6–23)
CO2: 31 mEq/L (ref 19–32)
CREATININE: 1.1 mg/dL (ref 0.4–1.5)
Calcium: 8.8 mg/dL (ref 8.4–10.5)
Chloride: 105 mEq/L (ref 96–112)
GFR: 70.37 mL/min (ref >60.00)
Glucose, Bld: 90 mg/dL (ref 70–99)
Potassium: 4 mEq/L (ref 3.5–5.1)
Sodium: 141 mEq/L (ref 135–145)

## 2014-06-20 LAB — LIPID PANEL
CHOLESTEROL: 123 mg/dL (ref 0–200)
HDL: 39.6 mg/dL (ref >39.00)
LDL CALC: 67 mg/dL (ref 0–99)
NonHDL: 83.4
Total CHOL/HDL Ratio: 3
Triglycerides: 82 mg/dL (ref 0.0–149.0)
VLDL: 16.4 mg/dL (ref 0.0–40.0)

## 2014-06-20 LAB — HEPATIC FUNCTION PANEL
ALT: 16 U/L (ref 0–53)
AST: 22 U/L (ref 0–37)
Albumin: 3.5 g/dL (ref 3.5–5.2)
Alkaline Phosphatase: 52 U/L (ref 39–117)
BILIRUBIN DIRECT: 0.1 mg/dL (ref 0.0–0.3)
TOTAL PROTEIN: 6.4 g/dL (ref 6.0–8.3)
Total Bilirubin: 0.6 mg/dL (ref 0.2–1.2)

## 2014-06-20 LAB — BRAIN NATRIURETIC PEPTIDE: PRO B NATRI PEPTIDE: 239 pg/mL — AB (ref 0.0–100.0)

## 2014-06-28 ENCOUNTER — Other Ambulatory Visit: Payer: Self-pay | Admitting: Cardiovascular Disease

## 2014-07-05 ENCOUNTER — Ambulatory Visit (INDEPENDENT_AMBULATORY_CARE_PROVIDER_SITE_OTHER): Payer: Medicare Other | Admitting: Pharmacist

## 2014-07-05 DIAGNOSIS — Z5181 Encounter for therapeutic drug level monitoring: Secondary | ICD-10-CM

## 2014-07-05 LAB — POCT INR: INR: 2.6

## 2014-07-20 ENCOUNTER — Other Ambulatory Visit: Payer: Self-pay | Admitting: Cardiovascular Disease

## 2014-07-24 ENCOUNTER — Other Ambulatory Visit: Payer: Self-pay | Admitting: Cardiology

## 2014-08-16 ENCOUNTER — Ambulatory Visit (INDEPENDENT_AMBULATORY_CARE_PROVIDER_SITE_OTHER): Payer: Medicare Other | Admitting: *Deleted

## 2014-08-16 DIAGNOSIS — I4891 Unspecified atrial fibrillation: Secondary | ICD-10-CM

## 2014-08-16 DIAGNOSIS — I4892 Unspecified atrial flutter: Secondary | ICD-10-CM

## 2014-08-16 DIAGNOSIS — Z5181 Encounter for therapeutic drug level monitoring: Secondary | ICD-10-CM

## 2014-08-16 LAB — POCT INR: INR: 3.1

## 2014-09-08 ENCOUNTER — Ambulatory Visit (INDEPENDENT_AMBULATORY_CARE_PROVIDER_SITE_OTHER): Payer: Medicare Other | Admitting: *Deleted

## 2014-09-08 ENCOUNTER — Ambulatory Visit (INDEPENDENT_AMBULATORY_CARE_PROVIDER_SITE_OTHER): Payer: Medicare Other | Admitting: Pharmacist

## 2014-09-08 DIAGNOSIS — I4891 Unspecified atrial fibrillation: Secondary | ICD-10-CM

## 2014-09-08 DIAGNOSIS — Z5181 Encounter for therapeutic drug level monitoring: Secondary | ICD-10-CM

## 2014-09-08 DIAGNOSIS — I4892 Unspecified atrial flutter: Secondary | ICD-10-CM

## 2014-09-08 DIAGNOSIS — Z23 Encounter for immunization: Secondary | ICD-10-CM

## 2014-09-08 LAB — POCT INR: INR: 2.9

## 2014-09-12 ENCOUNTER — Telehealth: Payer: Self-pay | Admitting: Cardiovascular Disease

## 2014-09-12 NOTE — Telephone Encounter (Signed)
Pt aware he is okay to hold warfarin 5 days prior to procedure next week.

## 2014-09-12 NOTE — Telephone Encounter (Signed)
This is fine to stop warfarin 5 days prior to endoscopy. thx

## 2014-09-12 NOTE — Telephone Encounter (Signed)
New message            [facility] is following up on request sent over via fax for pt to stop Coumadin for a colonoscopy procedure scheduled for 09/18/2014 / pt needs to stop 5 days prior / please give office a call asap

## 2014-09-13 ENCOUNTER — Telehealth: Payer: Self-pay | Admitting: Cardiovascular Disease

## 2014-09-13 NOTE — Telephone Encounter (Signed)
Fine to stop warfarin 5 days prior to the procedure. thx

## 2014-09-13 NOTE — Telephone Encounter (Signed)
New message    Patient schedule for colonoscopy on  11/30 . Need to come off coumadin for  5 days,    Please advise on when should he stop taken his medication.

## 2014-09-14 NOTE — Telephone Encounter (Signed)
I spoke with the pt and made him aware that he will take his last dose of coumadin on 09/19/14. Start holding coumadin on 09/20/14 and then resume when safe from a GI standpoint. Pt verbalized understanding of instructions.

## 2014-09-14 NOTE — Telephone Encounter (Signed)
Received request from Nurse fax box:   [facility]  Fax number: [phone] Attention:Courtney

## 2014-09-20 ENCOUNTER — Other Ambulatory Visit: Payer: Self-pay | Admitting: Cardiovascular Disease

## 2014-09-25 ENCOUNTER — Other Ambulatory Visit: Payer: Self-pay | Admitting: Gastroenterology

## 2014-10-05 ENCOUNTER — Ambulatory Visit (INDEPENDENT_AMBULATORY_CARE_PROVIDER_SITE_OTHER): Payer: Medicare Other | Admitting: *Deleted

## 2014-10-05 DIAGNOSIS — Z5181 Encounter for therapeutic drug level monitoring: Secondary | ICD-10-CM

## 2014-10-05 DIAGNOSIS — I4891 Unspecified atrial fibrillation: Secondary | ICD-10-CM

## 2014-10-05 DIAGNOSIS — I4892 Unspecified atrial flutter: Secondary | ICD-10-CM

## 2014-10-05 LAB — POCT INR: INR: 1.9

## 2014-10-12 ENCOUNTER — Encounter: Payer: Self-pay | Admitting: Cardiovascular Disease

## 2014-10-26 ENCOUNTER — Ambulatory Visit (INDEPENDENT_AMBULATORY_CARE_PROVIDER_SITE_OTHER): Payer: Medicare Other | Admitting: Pharmacist

## 2014-10-26 DIAGNOSIS — Z5181 Encounter for therapeutic drug level monitoring: Secondary | ICD-10-CM

## 2014-10-26 DIAGNOSIS — I4891 Unspecified atrial fibrillation: Secondary | ICD-10-CM

## 2014-10-26 DIAGNOSIS — I4892 Unspecified atrial flutter: Secondary | ICD-10-CM

## 2014-10-26 LAB — POCT INR: INR: 2.7

## 2014-11-26 ENCOUNTER — Other Ambulatory Visit: Payer: Self-pay | Admitting: Cardiovascular Disease

## 2014-12-05 ENCOUNTER — Ambulatory Visit (INDEPENDENT_AMBULATORY_CARE_PROVIDER_SITE_OTHER): Payer: Medicare Other | Admitting: Cardiovascular Disease

## 2014-12-05 ENCOUNTER — Ambulatory Visit (INDEPENDENT_AMBULATORY_CARE_PROVIDER_SITE_OTHER): Payer: Medicare Other | Admitting: *Deleted

## 2014-12-05 ENCOUNTER — Encounter: Payer: Self-pay | Admitting: Cardiovascular Disease

## 2014-12-05 VITALS — BP 134/74 | HR 85 | Ht 71.0 in | Wt 229.5 lb

## 2014-12-05 DIAGNOSIS — E7800 Pure hypercholesterolemia, unspecified: Secondary | ICD-10-CM

## 2014-12-05 DIAGNOSIS — I4891 Unspecified atrial fibrillation: Secondary | ICD-10-CM

## 2014-12-05 DIAGNOSIS — I10 Essential (primary) hypertension: Secondary | ICD-10-CM

## 2014-12-05 DIAGNOSIS — I421 Obstructive hypertrophic cardiomyopathy: Secondary | ICD-10-CM

## 2014-12-05 DIAGNOSIS — I4892 Unspecified atrial flutter: Secondary | ICD-10-CM

## 2014-12-05 DIAGNOSIS — Z5181 Encounter for therapeutic drug level monitoring: Secondary | ICD-10-CM

## 2014-12-05 LAB — POCT INR: INR: 2.8

## 2014-12-05 NOTE — Patient Instructions (Signed)
Your physician has requested that you have an echocardiogram in 6 MONTHS. Echocardiography is a painless test that uses sound waves to create images of your heart. It provides your doctor with information about the size and shape of your heart and how well your heart's chambers and valves are working. This procedure takes approximately one hour. There are no restrictions for this procedure.  Your physician recommends that you return for a FASTING LIPID, LIVER and BMP in 6 MONTHS--nothing to eat or drink after midnight, lab opens at 7:30 AM  Your physician wants you to follow-up in: 6 MONTHS with Dr [name].  You will receive a reminder letter in the mail two months in advance. If you don't receive a letter, please call our office to schedule the follow-up appointment.  Your physician recommends that you continue on your current medications as directed. Please refer to the Current Medication list given to you today.

## 2014-12-05 NOTE — Progress Notes (Signed)
Cardiology Office Note Date:  12/05/2014   ID:  Bradley Winters, DOB [birth year], MRN [id number]  PCP:  [name]  Cardiologist:  [name], MD    Chief Complaint  Patient presents with  . Atrial Fibrillation    6 month follow up      History of Present Illness: Bradley Winters is a 74 y.o. male who presents for follow-up of hypertrophic cardiomyopathy and chronic atrial fibrillation. The patient continues to do relatively well from a cardiac perspective. He denies chest pain, shortness of breath, orthopnea, or PND. He does have episodes of anxiety on occasion, primarily when he first lies down to bed at night.  The patient has not been exercising regularly. He has not gained weight, but he does admit to drinking a lot of soda.  Past Medical History  Diagnosis Date  . Hypertrophic cardiomyopathy     HEREDITARY--  POSITIVE for MYBPC3  GENE (HETEROZYGOUS)  . Persistent atrial fibrillation     CARDIOLOGIST-- DR [name]  AND DR [name]  . Venous insufficiency   . Thrombocytopenia PT ASYMPTOMATIC    MILD--  HEMOTOLOGIST--  DR [name]  . Hypertension   . ED (erectile dysfunction)   . History of colonoscopy with polypectomy     BENIGN  . History of seizures as a child     FEBRILE  . Anxiety     MOSTLY AT NIGHT  . History of gastric ulcer   . Wears glasses   . Bladder cancer   . PONV (postoperative nausea and vomiting)     Past Surgical History  Procedure Laterality Date  . Cardioversion  MULTIPLE -----  BETWEEN 2008  to  07-17-2009  . Tee with cardioversion  11-27-2007  . Cardiac electrophysiology mapping and ablation  12-07-2008  &  05-08-2009  DR [name]    SUCCESSFUL ABLATION ATRIAL-FIBRILLATION AND CARDIOVERSION  . Transthoracic echocardiogram  06-16-2013  DR [name]    PT HAS HX HCM/  MODERATE LVH with MILO SAM/  EF 55-60%/  MILD MR/  SEVERE LAE/  MODERATE RAE  . Cataract extraction w/ intraocular lens implant Right   . Sleep study  2011   NEGATIVE  . Tonsillectomy and adenoidectomy  AS CHILD  . Transurethral resection of bladder tumor N/A 02/10/2014    Procedure: TRANSURETHRAL RESECTION OF BLADDER TUMOR (TURBT);  Surgeon: [name], MD;  Location: [facility];  Service: Urology;  Laterality: N/A;  . Transurethral resection of bladder tumor N/A 04/07/2014    Procedure: TRANSURETHRAL  BLADDER BIOPSY;  Surgeon: [name], MD;  Location: [facility];  Service: Urology;  Laterality: N/A;    Current Outpatient Prescriptions  Medication Sig Dispense Refill  . acetaminophen (TYLENOL) 325 MG tablet Take 650 mg by mouth every 6 (six) hours as needed.      . diltiazem (CARDIZEM CD) 360 MG 24 hr capsule TAKE 1 CAPSULE (360 MG TOTAL) BY MOUTH DAILY. 90 capsule 0  . diltiazem (TIAZAC) 360 MG 24 hr capsule TAKE 1 CAPSULE (360 MG TOTAL) BY MOUTH DAILY. 90 capsule 0  . furosemide (LASIX) 20 MG tablet   1  . furosemide (LASIX) 40 MG tablet Take 1 tablet (40 mg total) by mouth daily. 90 tablet 3  . KLOR-CON M20 20 MEQ tablet TAKE 1/2 TABLET BY MOUTH DAILY 30 tablet 1  . metoprolol succinate (TOPROL-XL) 25 MG 24 hr tablet TAKE 1 TABLET BY MOUTH EVERY DAY 30 tablet 0  . sildenafil (  VIAGRA) 50 MG tablet Take 1 tablet (50 mg total) by mouth daily as needed for erectile dysfunction. 10 tablet 2  . warfarin (COUMADIN) 2 MG tablet TAKE AS DIRECTED BY COUMADIN CLINIC 40 tablet 1   No current facility-administered medications for this visit.    Allergies:   Review of patient's allergies indicates no known allergies.   Social History:  The patient  reports that he quit smoking about 16 years ago. His smoking use included Cigarettes. He quit after 42 years of use. He has never used smokeless tobacco. He reports that he drinks alcohol. He reports that he does not use illicit drugs.   Family History:  The patient's  family history includes Colon cancer in an other family member.    ROS:  Please see the history of  present illness.  Otherwise, review of systems is positive for erectile dysfunction.  All other systems are reviewed and negative.   PHYSICAL EXAM: VS:  BP 134/74 mmHg  Pulse 85  Ht 5\' 11"  (1.803 m)  Wt 229 lb 8 oz (104.101 kg)  BMI 32.02 kg/m2 , BMI Body mass index is 32.02 kg/(m^2). GEN: Well nourished, well developed, in no acute distress HEENT: normal Neck: no JVD, carotid bruits, or masses Cardiac: Irregularly irregular with grade 2/6 systolic murmur at the left sternal border                 1+ peripheral edema Respiratory:  clear to auscultation bilaterally, normal work of breathing GI: soft, nontender, nondistended, + BS MS: no deformity or atrophy Skin: warm and dry, no rash Neuro:  Strength and sensation are intact Psych: euthymic mood, full affect  EKG:  EKG is ordered today. The ekg ordered today shows atrial fibrillation 85 bpm, age-indeterminate septal infarct, ST and T-wave abnormality consider lateral ischemia.  Recent Labs: 04/07/2014: Hemoglobin 14.6; Platelets 166 06/20/2014: ALT 16; BUN 20; Creatinine 1.1; Potassium 4.0; Pro B Natriuretic peptide (BNP) 239.0*; Sodium 141   Lipid Panel     Component Value Date/Time   CHOL 123 06/20/2014 0914   TRIG 82.0 06/20/2014 0914   HDL 39.60 06/20/2014 0914   CHOLHDL 3 06/20/2014 0914   VLDL 16.4 06/20/2014 0914   LDLCALC 67 06/20/2014 0914      Wt Readings from Last 3 Encounters:  12/05/14 229 lb 8 oz (104.101 kg)  06/02/14 232 lb (105.235 kg)  04/07/14 228 lb (103.42 kg)     Cardiac Studies Reviewed: 2D Echo 06-16-2013: Study Conclusions  - Left ventricle: The cavity size was normal. Wall thickness was increased in a pattern of moderate LVH. Systolic function was normal. The estimated ejection fraction was in the range of 55% to 60%. Wall motion was normal; there were no regional wall motion abnormalities. Doppler parameters are consistent with high ventricular filling pressure. - Mitral  valve: There was mild systolic anterior motion of the anterior leaflet and posterior leaflet. Mild regurgitation. - Left atrium: The atrium was severely dilated. - Right atrium: The atrium was moderately dilated. - Pulmonary arteries: Systolic pressure was mildly increased. PA peak pressure: 87mm Hg (S). Impressions:  - Patient with history of HCM; moderate LVH with mild SAM; no significant LVOT gradient at rest.  ASSESSMENT AND PLAN: 1.  Chronic atrial fibrillation. The patient is tolerating anticoagulation without bleeding problems. He will continue on his current medical program.  2. Hypertrophic cardiomyopathy. Most recent echocardiogram reviewed. There was not any significant LV outflow obstruction. Continue diltiazem and metoprolol succinate. Continue observation. Recommend a  repeat echocardiogram in 6 months.  3. Chronic edema. Suspect related to venous insufficiency. He admits to sodium indiscretion. He was counseled regarding this. Also discussed the importance of reducing soft drinks.  4. HTN, essential: BP well-controlled  Current medicines are reviewed with the patient today.  The patient does not have concerns regarding medicines.  The following changes have been made:  no change  Labs/ tests ordered today include:   Orders Placed This Encounter  Procedures  . Lipid panel  . Hepatic function panel  . Basic Metabolic Panel (BMET)  . EKG 12-Lead  . 2D Echocardiogram without contrast    Disposition:   FU with me in 6 months after labwork and echocardiogram are completed.  Signed, [name], MD  12/05/2014 5:44 PM    [facility] [place], [place], [place]  [address] Phone: [phone]; Fax: [phone]

## 2014-12-21 ENCOUNTER — Other Ambulatory Visit: Payer: Self-pay | Admitting: Cardiovascular Disease

## 2014-12-28 ENCOUNTER — Other Ambulatory Visit: Payer: Self-pay | Admitting: Cardiovascular Disease

## 2015-01-05 ENCOUNTER — Ambulatory Visit (INDEPENDENT_AMBULATORY_CARE_PROVIDER_SITE_OTHER): Payer: Medicare Other | Admitting: *Deleted

## 2015-01-05 DIAGNOSIS — Z5181 Encounter for therapeutic drug level monitoring: Secondary | ICD-10-CM | POA: Diagnosis not present

## 2015-01-05 DIAGNOSIS — I4891 Unspecified atrial fibrillation: Secondary | ICD-10-CM | POA: Diagnosis not present

## 2015-01-05 DIAGNOSIS — I4892 Unspecified atrial flutter: Secondary | ICD-10-CM

## 2015-01-05 LAB — POCT INR: INR: 2.6

## 2015-01-08 ENCOUNTER — Other Ambulatory Visit: Payer: Self-pay | Admitting: Cardiovascular Disease

## 2015-01-09 ENCOUNTER — Other Ambulatory Visit: Payer: Self-pay

## 2015-01-21 ENCOUNTER — Other Ambulatory Visit: Payer: Self-pay | Admitting: Cardiovascular Disease

## 2015-02-16 ENCOUNTER — Ambulatory Visit (INDEPENDENT_AMBULATORY_CARE_PROVIDER_SITE_OTHER): Payer: Medicare Other | Admitting: Pharmacist

## 2015-02-16 DIAGNOSIS — Z5181 Encounter for therapeutic drug level monitoring: Secondary | ICD-10-CM | POA: Diagnosis not present

## 2015-02-16 DIAGNOSIS — I4892 Unspecified atrial flutter: Secondary | ICD-10-CM | POA: Diagnosis not present

## 2015-02-16 DIAGNOSIS — I4891 Unspecified atrial fibrillation: Secondary | ICD-10-CM

## 2015-02-16 LAB — POCT INR: INR: 2.8

## 2015-03-30 ENCOUNTER — Ambulatory Visit (INDEPENDENT_AMBULATORY_CARE_PROVIDER_SITE_OTHER): Payer: Medicare Other | Admitting: *Deleted

## 2015-03-30 DIAGNOSIS — I4891 Unspecified atrial fibrillation: Secondary | ICD-10-CM | POA: Diagnosis not present

## 2015-03-30 DIAGNOSIS — Z5181 Encounter for therapeutic drug level monitoring: Secondary | ICD-10-CM | POA: Diagnosis not present

## 2015-03-30 DIAGNOSIS — I4892 Unspecified atrial flutter: Secondary | ICD-10-CM | POA: Diagnosis not present

## 2015-03-30 LAB — POCT INR: INR: 2.6

## 2015-04-25 ENCOUNTER — Other Ambulatory Visit: Payer: Self-pay | Admitting: Cardiovascular Disease

## 2015-05-01 ENCOUNTER — Telehealth: Payer: Self-pay | Admitting: Cardiovascular Disease

## 2015-05-01 NOTE — Telephone Encounter (Signed)
Pt c/o medication issue: 1. Name of Medication: levofloxacin 250 mg and Fluticasone Propionate Nasal Spray  4. What is your medication issue? Pt request a call back to determine if it is ok to take these medication given his heart condition. Please call

## 2015-05-01 NOTE — Telephone Encounter (Signed)
Will forward this message to Elberta Leatherwood Pharmacist in [facility] to further advise on new medications pt is starting on and interaction with his coumadin while taking these meds.  Pt is followed by Elberta Leatherwood Pharmacist here in our [facility].  Gay Filler aware of medication issue.

## 2015-05-01 NOTE — Telephone Encounter (Signed)
Spoke with pt.  He will be taking Levaquin for 7 days.  Given low dose and INR was in the middle of his range at last check, will have him just eat a serving of greens each day while taking the abx and follow up on 7/15 as previously planned.

## 2015-05-16 ENCOUNTER — Ambulatory Visit (INDEPENDENT_AMBULATORY_CARE_PROVIDER_SITE_OTHER): Payer: Medicare Other | Admitting: *Deleted

## 2015-05-16 ENCOUNTER — Encounter: Payer: Self-pay | Admitting: Cardiovascular Disease

## 2015-05-16 ENCOUNTER — Ambulatory Visit (INDEPENDENT_AMBULATORY_CARE_PROVIDER_SITE_OTHER): Payer: Medicare Other | Admitting: Cardiovascular Disease

## 2015-05-16 VITALS — BP 126/66 | HR 81 | Wt 232.8 lb

## 2015-05-16 DIAGNOSIS — I10 Essential (primary) hypertension: Secondary | ICD-10-CM

## 2015-05-16 DIAGNOSIS — I4891 Unspecified atrial fibrillation: Secondary | ICD-10-CM | POA: Diagnosis not present

## 2015-05-16 DIAGNOSIS — E7800 Pure hypercholesterolemia, unspecified: Secondary | ICD-10-CM

## 2015-05-16 DIAGNOSIS — I4892 Unspecified atrial flutter: Secondary | ICD-10-CM

## 2015-05-16 DIAGNOSIS — I421 Obstructive hypertrophic cardiomyopathy: Secondary | ICD-10-CM | POA: Diagnosis not present

## 2015-05-16 DIAGNOSIS — Z5181 Encounter for therapeutic drug level monitoring: Secondary | ICD-10-CM | POA: Diagnosis not present

## 2015-05-16 LAB — POCT INR: INR: 2.7

## 2015-05-16 NOTE — Progress Notes (Signed)
Cardiology Office Note Date:  05/16/2015   ID:  Bradley Winters, DOB 1941/10/18, MRN [id number]  PCP:  [name]  Cardiologist:  [name], MD    Chief Complaint  Patient presents with  . Atrial Fibrillation    History of Present Illness: Bradley Winters is a 74 y.o. male who presents for follow-up evaluation. He is followed for hypertrophic cardiomyopathy and chronic atrial fibrillation.  Bradley Winters is doing relatively well. He had viral pneumonia and was treated with a course of prednisone. He has had some tachypalpitations associated with this but symptoms have now resolved. Also had some worsening of chronic leg swelling. Otherwise having no problems. Denies chest pain or pressure. No orthopnea, or PND. He's had no bleeding problems on long-term warfarin.  He continues to follow regularly for bladder CA with urology and this has been stable without recurrence. He denies hematuria.  Past Medical History  Diagnosis Date  . Hypertrophic cardiomyopathy     HEREDITARY--  POSITIVE for MYBPC3  GENE (HETEROZYGOUS)  . Persistent atrial fibrillation     CARDIOLOGIST-- DR [name]  AND DR [name]  . Venous insufficiency   . Thrombocytopenia PT ASYMPTOMATIC    MILD--  HEMOTOLOGIST--  DR [name]  . Hypertension   . ED (erectile dysfunction)   . History of colonoscopy with polypectomy     BENIGN  . History of seizures as a child     FEBRILE  . Anxiety     MOSTLY AT NIGHT  . History of gastric ulcer   . Wears glasses   . Bladder cancer   . PONV (postoperative nausea and vomiting)     Past Surgical History  Procedure Laterality Date  . Cardioversion  MULTIPLE -----  BETWEEN 2008  to  07-17-2009  . Tee with cardioversion  11-27-2007  . Cardiac electrophysiology mapping and ablation  12-07-2008  &  05-08-2009  DR [name]    SUCCESSFUL ABLATION ATRIAL-FIBRILLATION AND CARDIOVERSION  . Transthoracic echocardiogram  06-16-2013  DR Ketara Cavness    PT HAS HX HCM/  MODERATE LVH  with MILO SAM/  EF 55-60%/  MILD MR/  SEVERE LAE/  MODERATE RAE  . Cataract extraction w/ intraocular lens implant Right   . Sleep study  2011    NEGATIVE  . Tonsillectomy and adenoidectomy  AS CHILD  . Transurethral resection of bladder tumor N/A 02/10/2014    Procedure: TRANSURETHRAL RESECTION OF BLADDER TUMOR (TURBT);  Surgeon: [name], MD;  Location: [facility];  Service: Urology;  Laterality: N/A;  . Transurethral resection of bladder tumor N/A 04/07/2014    Procedure: TRANSURETHRAL  BLADDER BIOPSY;  Surgeon: [name], MD;  Location: [facility];  Service: Urology;  Laterality: N/A;    Current Outpatient Prescriptions  Medication Sig Dispense Refill  . acetaminophen (TYLENOL) 325 MG tablet Take 650 mg by mouth every 6 (six) hours as needed.      . diltiazem (CARDIZEM CD) 360 MG 24 hr capsule TAKE 1 CAPSULE (360 MG TOTAL) BY MOUTH DAILY. 90 capsule 6  . furosemide (LASIX) 40 MG tablet Take 1 tablet (40 mg total) by mouth daily. 90 tablet 3  . KLOR-CON M20 20 MEQ tablet TAKE 1/2 TABLET BY MOUTH DAILY 30 tablet 1  . metoprolol succinate (TOPROL-XL) 25 MG 24 hr tablet TAKE 1 TABLET BY MOUTH EVERY DAY 30 tablet 1  . sildenafil (VIAGRA) 50 MG tablet Take 1 tablet (50 mg total) by mouth daily as needed  for erectile dysfunction. 10 tablet 2  . warfarin (COUMADIN) 2 MG tablet TAKE AS DIRECTED BY COUMADIN CLINIC 40 tablet 1   No current facility-administered medications for this visit.    Allergies:   Review of patient's allergies indicates no known allergies.   Social History:  The patient  reports that he quit smoking about 16 years ago. His smoking use included Cigarettes. He quit after 42 years of use. He has never used smokeless tobacco. He reports that he drinks alcohol. He reports that he does not use illicit drugs.   Family History:  The patient's  family history includes Colon cancer in an other family member.    ROS:  Please see the  history of present illness.  Otherwise, review of systems is positive for sinus congestion, ankle swelling.  All other systems are reviewed and negative.   PHYSICAL EXAM: VS:  BP 126/66 mmHg  Pulse 81  Wt 232 lb 12 oz (105.575 kg) , BMI Body mass index is 32.48 kg/(m^2). GEN: Well nourished, well developed, in no acute distress HEENT: normal Neck: no JVD, no masses. No carotid bruits Cardiac: irregularly irregular with a soft systolic ejection murmur at the LSB                Respiratory:  clear to auscultation bilaterally, normal work of breathing GI: soft, nontender, nondistended, + BS MS: no deformity or atrophy Ext: marked ankle edema bilaterally, right > left with chronic stasis changes Neuro:  Strength and sensation are intact Psych: euthymic mood, full affect  EKG:  EKG is ordered today. The ekg ordered today shows atrial fibrillation with heart rate 81 bpm, low-voltage QRS, septal infarct age-undetermined.  Recent Labs: 06/20/2014: ALT 16; BUN 20; Creatinine, Ser 1.1; Potassium 4.0; Pro B Natriuretic peptide (BNP) 239.0*; Sodium 141   Lipid Panel     Component Value Date/Time   CHOL 123 06/20/2014 0914   TRIG 82.0 06/20/2014 0914   HDL 39.60 06/20/2014 0914   CHOLHDL 3 06/20/2014 0914   VLDL 16.4 06/20/2014 0914   LDLCALC 67 06/20/2014 0914      Wt Readings from Last 3 Encounters:  05/16/15 232 lb 12 oz (105.575 kg)  12/05/14 229 lb 8 oz (104.101 kg)  06/02/14 232 lb (105.235 kg)    Cardiac Studies Reviewed: 2D Echo 06-16-2013: Study Conclusions  - Left ventricle: The cavity size was normal. Wall thickness was increased in a pattern of moderate LVH. Systolic function was normal. The estimated ejection fraction was in the range of 55% to 60%. Wall motion was normal; there were no regional wall motion abnormalities. Doppler parameters are consistent with high ventricular filling pressure. - Mitral valve: There was mild systolic anterior motion  of the anterior leaflet and posterior leaflet. Mild regurgitation. - Left atrium: The atrium was severely dilated. - Right atrium: The atrium was moderately dilated. - Pulmonary arteries: Systolic pressure was mildly increased. PA peak pressure: 28mm Hg (S). Impressions:  - Patient with history of HCM; moderate LVH with mild SAM; no significant LVOT gradient at rest.  ASSESSMENT AND PLAN: 1. Chronic atrial fibrillation. The patient is tolerating anticoagulation without bleeding problems. He will continue on his current medical program. His heart rate is well-controlled.  2. Hypertrophic cardiomyopathy. Continue diltiazem and metoprolol succinate. Continue observation. I am going to update his echocardiaogram - last study was 2 years ago.  3. Chronic edema. He doesn't tolerate compression stockings. Will continue with leg elevation and sodium restriction.  4. HTN, essential: BP  well-controlled  Current medicines are reviewed with the patient today.  The patient does not have concerns regarding medicines.  Labs/ tests ordered today include:  No orders of the defined types were placed in this encounter.    Disposition:   FU 6 months  Signed, [name], MD  05/16/2015 3:25 PM    [facility] [place], [place], [place]  [address] Phone: [phone]; Fax: [phone]

## 2015-05-16 NOTE — Patient Instructions (Signed)
Medication Instructions:  Your physician recommends that you continue on your current medications as directed. Please refer to the Current Medication list given to you today.  Labwork: Your physician recommends that you return for a FASTING LIPID, LIVER, BMP and CBC--nothing to eat or drink after midnight, lab opens at 7:30 AM  Testing/Procedures: Your physician has requested that you have an echocardiogram. Echocardiography is a painless test that uses sound waves to create images of your heart. It provides your doctor with information about the size and shape of your heart and how well your heart's chambers and valves are working. This procedure takes approximately one hour. There are no restrictions for this procedure.  Follow-Up: Your physician wants you to follow-up in: 6 MONTHS with Dr [name].  You will receive a reminder letter in the mail two months in advance. If you don't receive a letter, please call our office to schedule the follow-up appointment.  Any Other Special Instructions Will Be Listed Below (If Applicable).

## 2015-05-22 ENCOUNTER — Other Ambulatory Visit: Payer: Self-pay

## 2015-05-22 ENCOUNTER — Other Ambulatory Visit: Payer: Self-pay | Admitting: Cardiovascular Disease

## 2015-05-22 ENCOUNTER — Ambulatory Visit (HOSPITAL_COMMUNITY): Payer: Medicare Other | Attending: Cardiovascular Disease

## 2015-05-22 ENCOUNTER — Other Ambulatory Visit (INDEPENDENT_AMBULATORY_CARE_PROVIDER_SITE_OTHER): Payer: Medicare Other | Admitting: *Deleted

## 2015-05-22 ENCOUNTER — Other Ambulatory Visit (HOSPITAL_COMMUNITY): Payer: Self-pay | Admitting: Cardiovascular Disease

## 2015-05-22 DIAGNOSIS — I4891 Unspecified atrial fibrillation: Secondary | ICD-10-CM

## 2015-05-22 DIAGNOSIS — Z87891 Personal history of nicotine dependence: Secondary | ICD-10-CM | POA: Diagnosis not present

## 2015-05-22 DIAGNOSIS — I421 Obstructive hypertrophic cardiomyopathy: Secondary | ICD-10-CM

## 2015-05-22 DIAGNOSIS — E7800 Pure hypercholesterolemia, unspecified: Secondary | ICD-10-CM

## 2015-05-22 DIAGNOSIS — I10 Essential (primary) hypertension: Secondary | ICD-10-CM

## 2015-05-22 DIAGNOSIS — I313 Pericardial effusion (noninflammatory): Secondary | ICD-10-CM | POA: Insufficient documentation

## 2015-05-22 DIAGNOSIS — I340 Nonrheumatic mitral (valve) insufficiency: Secondary | ICD-10-CM | POA: Diagnosis not present

## 2015-05-22 DIAGNOSIS — E785 Hyperlipidemia, unspecified: Secondary | ICD-10-CM | POA: Insufficient documentation

## 2015-05-22 LAB — CBC
HEMATOCRIT: 42.6 % (ref 39.0–52.0)
Hemoglobin: 14.1 g/dL (ref 13.0–17.0)
MCHC: 33 g/dL (ref 30.0–36.0)
MCV: 93.2 fl (ref 78.0–100.0)
PLATELETS: 153 10*3/uL (ref 150.0–400.0)
RBC: 4.57 Mil/uL (ref 4.22–5.81)
RDW: 14.5 % (ref 11.5–15.5)
WBC: 9.1 10*3/uL (ref 4.0–10.5)

## 2015-05-22 LAB — BASIC METABOLIC PANEL
BUN: 17 mg/dL (ref 6–23)
CO2: 32 mEq/L (ref 19–32)
Calcium: 8.9 mg/dL (ref 8.4–10.5)
Chloride: 104 mEq/L (ref 96–112)
Creatinine, Ser: 1.03 mg/dL (ref 0.40–1.50)
GFR: 74.93 mL/min (ref >60.00)
Glucose, Bld: 90 mg/dL (ref 70–99)
Potassium: 3.5 mEq/L (ref 3.5–5.1)
Sodium: 142 mEq/L (ref 135–145)

## 2015-05-22 LAB — HEPATIC FUNCTION PANEL
ALBUMIN: 3.7 g/dL (ref 3.5–5.2)
ALK PHOS: 55 U/L (ref 39–117)
ALT: 9 U/L (ref 0–53)
AST: 15 U/L (ref 0–37)
Bilirubin, Direct: 0.2 mg/dL (ref 0.0–0.3)
Total Bilirubin: 0.7 mg/dL (ref 0.2–1.2)
Total Protein: 6.5 g/dL (ref 6.0–8.3)

## 2015-05-22 LAB — LIPID PANEL
Cholesterol: 130 mg/dL (ref 0–200)
HDL: 42.1 mg/dL (ref >39.00)
LDL Cholesterol: 69 mg/dL (ref 0–99)
NonHDL: 87.9
Total CHOL/HDL Ratio: 3
Triglycerides: 93 mg/dL (ref 0.0–149.0)
VLDL: 18.6 mg/dL (ref 0.0–40.0)

## 2015-06-02 ENCOUNTER — Other Ambulatory Visit: Payer: Self-pay | Admitting: Cardiovascular Disease

## 2015-06-25 ENCOUNTER — Other Ambulatory Visit: Payer: Self-pay | Admitting: Cardiovascular Disease

## 2015-06-28 ENCOUNTER — Other Ambulatory Visit: Payer: Self-pay | Admitting: Cardiovascular Disease

## 2015-07-13 ENCOUNTER — Other Ambulatory Visit: Payer: Self-pay | Admitting: Cardiovascular Disease

## 2015-09-24 ENCOUNTER — Ambulatory Visit (INDEPENDENT_AMBULATORY_CARE_PROVIDER_SITE_OTHER): Payer: Medicare Other | Admitting: Pharmacist

## 2015-09-24 DIAGNOSIS — I4892 Unspecified atrial flutter: Secondary | ICD-10-CM

## 2015-09-24 DIAGNOSIS — I4891 Unspecified atrial fibrillation: Secondary | ICD-10-CM | POA: Diagnosis not present

## 2015-09-24 DIAGNOSIS — Z5181 Encounter for therapeutic drug level monitoring: Secondary | ICD-10-CM

## 2015-09-24 LAB — POCT INR: INR: 1.9

## 2015-09-24 MED ORDER — WARFARIN SODIUM 2 MG PO TABS: ORAL_TABLET | ORAL | Status: DC

## 2015-10-10 ENCOUNTER — Other Ambulatory Visit: Payer: Self-pay | Admitting: Cardiovascular Disease

## 2015-10-24 ENCOUNTER — Ambulatory Visit (INDEPENDENT_AMBULATORY_CARE_PROVIDER_SITE_OTHER): Payer: Medicare Other | Admitting: *Deleted

## 2015-10-24 DIAGNOSIS — Z5181 Encounter for therapeutic drug level monitoring: Secondary | ICD-10-CM | POA: Diagnosis not present

## 2015-10-24 DIAGNOSIS — I4892 Unspecified atrial flutter: Secondary | ICD-10-CM

## 2015-10-24 DIAGNOSIS — I4891 Unspecified atrial fibrillation: Secondary | ICD-10-CM | POA: Diagnosis not present

## 2015-10-24 LAB — POCT INR: INR: 2.5

## 2015-11-21 ENCOUNTER — Other Ambulatory Visit: Payer: Self-pay | Admitting: Cardiovascular Disease

## 2015-11-21 ENCOUNTER — Ambulatory Visit (INDEPENDENT_AMBULATORY_CARE_PROVIDER_SITE_OTHER): Payer: Medicare Other | Admitting: *Deleted

## 2015-11-21 DIAGNOSIS — I4892 Unspecified atrial flutter: Secondary | ICD-10-CM | POA: Diagnosis not present

## 2015-11-21 DIAGNOSIS — Z5181 Encounter for therapeutic drug level monitoring: Secondary | ICD-10-CM | POA: Diagnosis not present

## 2015-11-21 DIAGNOSIS — I4891 Unspecified atrial fibrillation: Secondary | ICD-10-CM

## 2015-11-21 LAB — POCT INR: INR: 2

## 2015-11-21 NOTE — Telephone Encounter (Signed)
Bradley Mocha, MD at 05/16/2015 3:25 PM  metoprolol succinate (TOPROL-XL) 25 MG 24 hr tabletTAKE 1 TABLET BY MOUTH EVERY DAY Patient Instructions     Medication Instructions:  Your physician recommends that you continue on your current medications as directed. Please refer to the Current Medication list given to you today

## 2015-12-11 ENCOUNTER — Other Ambulatory Visit: Payer: Self-pay | Admitting: Cardiovascular Disease

## 2015-12-19 ENCOUNTER — Ambulatory Visit (INDEPENDENT_AMBULATORY_CARE_PROVIDER_SITE_OTHER): Payer: Medicare Other | Admitting: *Deleted

## 2015-12-19 DIAGNOSIS — Z5181 Encounter for therapeutic drug level monitoring: Secondary | ICD-10-CM

## 2015-12-19 DIAGNOSIS — I4891 Unspecified atrial fibrillation: Secondary | ICD-10-CM

## 2015-12-19 DIAGNOSIS — I4892 Unspecified atrial flutter: Secondary | ICD-10-CM | POA: Diagnosis not present

## 2015-12-19 LAB — POCT INR: INR: 2.3

## 2015-12-22 ENCOUNTER — Other Ambulatory Visit: Payer: Self-pay | Admitting: Cardiovascular Disease

## 2015-12-26 ENCOUNTER — Encounter: Payer: Self-pay | Admitting: Cardiovascular Disease

## 2015-12-26 ENCOUNTER — Ambulatory Visit (INDEPENDENT_AMBULATORY_CARE_PROVIDER_SITE_OTHER): Payer: Medicare Other | Admitting: Cardiovascular Disease

## 2015-12-26 VITALS — BP 130/78 | HR 94 | Ht 71.0 in | Wt 246.0 lb

## 2015-12-26 DIAGNOSIS — I422 Other hypertrophic cardiomyopathy: Secondary | ICD-10-CM | POA: Diagnosis not present

## 2015-12-26 DIAGNOSIS — I482 Chronic atrial fibrillation, unspecified: Secondary | ICD-10-CM

## 2015-12-26 MED ORDER — POTASSIUM CHLORIDE CRYS ER 20 MEQ PO TBCR: 20.0000 meq | EXTENDED_RELEASE_TABLET | Freq: Two times a day (BID) | ORAL | Status: DC

## 2015-12-26 MED ORDER — FUROSEMIDE 40 MG PO TABS: 40.0000 mg | ORAL_TABLET | Freq: Two times a day (BID) | ORAL | Status: DC

## 2015-12-26 NOTE — Patient Instructions (Addendum)
Medication Instructions:  Your physician has recommended you make the following change in your medication:  1. INCREASE Furosemide to 40mg  take one tablet by mouth twice a day 2. INCREASE Potassium Chloride to 47mEq take one tablet by mouth twice a day  Labwork: Your physician recommends that you return for lab work in: 2 WEEKS (BMP) 01/09/2016, lab hours 7:30 AM-5:00 PM  Testing/Procedures: No new orders.   Follow-Up: Your physician wants you to follow-up in: 6 MONTHS with Dr [name].  You will receive a reminder letter in the mail two months in advance. If you don't receive a letter, please call our office to schedule the follow-up appointment.   Any Other Special Instructions Will Be Listed Below (If Applicable).     If you need a refill on your cardiac medications before your next appointment, please call your pharmacy.

## 2015-12-26 NOTE — Progress Notes (Signed)
Cardiology Office Note Date:  12/26/2015   ID:  JESSELEE REGNER, DOB [birth year], MRN [id number]  PCP:  [name]  Cardiologist:  [name], MD    Chief Complaint  Patient presents with  . Leg Swelling   History of Present Illness: RASHEIM DAHMAN is a 75 y.o. male who presents for follow-up evaluation. He is followed for hypertrophic cardiomyopathy and chronic atrial fibrillation.  He has chronic leg swelling related to venous insufficiency and this has been worse lately. He isn't able to tolerate compression stockings and he hasn't been elevating his legs. No chest pain, shortness of breath, orthopnea, or PND. No other new complaints today.  Past Medical History  Diagnosis Date  . Hypertrophic cardiomyopathy ([place])     HEREDITARY--  POSITIVE for MYBPC3  GENE (HETEROZYGOUS)  . Persistent atrial fibrillation ([place])     CARDIOLOGIST-- DR [name]  AND DR [name]  . Venous insufficiency   . Thrombocytopenia ([place]) PT ASYMPTOMATIC    MILD--  HEMOTOLOGIST--  DR [name]  . Hypertension   . ED (erectile dysfunction)   . History of colonoscopy with polypectomy     BENIGN  . History of seizures as a child     FEBRILE  . Anxiety     MOSTLY AT NIGHT  . History of gastric ulcer   . Wears glasses   . Bladder cancer ([place])   . PONV (postoperative nausea and vomiting)     Past Surgical History  Procedure Laterality Date  . Cardioversion  MULTIPLE -----  BETWEEN 2008  to  07-17-2009  . Tee with cardioversion  11-27-2007  . Cardiac electrophysiology mapping and ablation  12-07-2008  &  05-08-2009  DR [name]    SUCCESSFUL ABLATION ATRIAL-FIBRILLATION AND CARDIOVERSION  . Transthoracic echocardiogram  06-16-2013  DR Ziyah Cordoba    PT HAS HX HCM/  MODERATE LVH with MILO SAM/  EF 55-60%/  MILD MR/  SEVERE LAE/  MODERATE RAE  . Cataract extraction w/ intraocular lens implant Right   . Sleep study  2011    NEGATIVE  . Tonsillectomy and adenoidectomy  AS CHILD  .  Transurethral resection of bladder tumor N/A 02/10/2014    Procedure: TRANSURETHRAL RESECTION OF BLADDER TUMOR (TURBT);  Surgeon: [name], MD;  Location[facility];  Service: Urology;  Laterality: N/A;  . Transurethral resection of bladder tumor N/A 04/07/2014    Procedure: TRANSURETHRAL  BLADDER BIOPSY;  Surgeon: [name], MD;  [facility];  Service: Urology;  Laterality: N/A;    Current Outpatient Prescriptions  Medication Sig Dispense Refill  . acetaminophen (TYLENOL) 325 MG tablet Take 650 mg by mouth every 6 (six) hours as needed.      . diltiazem (CARDIZEM CD) 360 MG 24 hr capsule TAKE ONE CAPSULE BY MOUTH EVERY DAY 90 capsule 0  . furosemide (LASIX) 40 MG tablet Take 1 tablet (40 mg total) by mouth daily. 90 tablet 1  . KLOR-CON M20 20 MEQ tablet TAKE 1 TABLET EVERY DAY 90 tablet 3  . metoprolol succinate (TOPROL-XL) 25 MG 24 hr tablet TAKE 1 TABLET (25 MG TOTAL) BY MOUTH DAILY. 30 tablet 1  . warfarin (COUMADIN) 2 MG tablet TAKE 1 TABLET BY MOUTH AS DIRECTED BY THE COUMADIN CLINIC 30 tablet 1   No current facility-administered medications for this visit.   Allergies:   Review of patient's allergies indicates no known allergies.   Social History:  The patient  reports that  he quit smoking about 17 years ago. His smoking use included Cigarettes. He quit after 42 years of use. He has never used smokeless tobacco. He reports that he drinks alcohol. He reports that he does not use illicit drugs.   Family History:  The patient's  family history is not on file.   ROS:  Please see the history of present illness.  Otherwise, review of systems is positive for leg swelling, cough, and leg pain.  All other systems are reviewed and negative.   PHYSICAL EXAM: VS:  BP 130/78 mmHg  Pulse 94  Ht 5\' 11"  (1.803 m)  Wt 111.585 kg (246 lb)  BMI 34.33 kg/m2 , BMI Body mass index is 34.33 kg/(m^2). GEN: Well nourished, well developed, in no acute  distress HEENT: normal Neck: no JVD, no masses. No carotid bruits Cardiac: irregularly irregular without murmur or gallop            Respiratory:  clear to auscultation bilaterally, normal work of breathing GI: soft, nontender, nondistended, + BS MS: no deformity or atrophy Ext: 2-3+ bilateral pretibial edema, pedal pulses 2+= bilaterally Skin: warm and dry, no rash Neuro:  Strength and sensation are intact Psych: euthymic mood, full affect  EKG:  EKG is ordered today. The ekg ordered today shows atrial fibrillation 94  Recent Labs: 05/22/2015: ALT 9; BUN 17; Creatinine, Ser 1.03; Hemoglobin 14.1; Platelets 153.0; Potassium 3.5; Sodium 142   Lipid Panel     Component Value Date/Time   CHOL 130 05/22/2015 0906   TRIG 93.0 05/22/2015 0906   HDL 42.10 05/22/2015 0906   CHOLHDL 3 05/22/2015 0906   VLDL 18.6 05/22/2015 0906   LDLCALC 69 05/22/2015 0906      Wt Readings from Last 3 Encounters:  12/26/15 111.585 kg (246 lb)  05/16/15 105.575 kg (232 lb 12 oz)  12/05/14 104.101 kg (229 lb 8 oz)     Cardiac Studies Reviewed: 2D Echo 05/22/2015: Study Conclusions  - Left ventricle: The cavity size was normal. Wall thickness was increased in a pattern of moderate LVH. Systolic function was normal. The estimated ejection fraction was in the range of 50% to 55%. There was a reduced contribution of atrial contraction to ventricular filling, due to increased ventricular diastolic pressure or atrial contractile dysfunction. Doppler parameters are consistent with high ventricular filling pressure. - Mitral valve: There was mild regurgitation. - Left atrium: The atrium was moderately dilated. - Atrial septum: No defect or patent foramen ovale was identified. - Pulmonary arteries: PA peak pressure: 35 mm Hg (S). - Pericardium, extracardiac: A trivial pericardial effusion was identified posterior to the heart.  ASSESSMENT AND PLAN: 1.  Chronic atrial fibrillation:  tolerating warfarin without bleeding problems, heart rate control with diltiazem and metoprolol.   2. Hypertrophic cardiomyopathy: stable. Most recent echo reviewed with minimal changes. No significant outflow gradient.   3. Leg edema: continues to be a major issue. He has changes of stasis dermatitis and I suspect this is primarily due to venous insufficiency. Advised increase lasix 40 mg BID, increase KDur 20 meq BID, and consistently elevate legs when at rest  4. Essential HTN: BP controlled on current Rx. No changes made to his medical regimen today except for increase in lasix/potassium. Will check a BMET in a few weeks.   Current medicines are reviewed with the patient today.  The patient does not have concerns regarding medicines.  Labs/ tests ordered today include:  No orders of the defined types were placed in this encounter.  Disposition:   FU 6 months  Signed, [name], MD  12/26/2015 3:48 PM    [facility] [address], [place], [place]  [address] Phone: [phone]; Fax: [phone]

## 2016-01-09 ENCOUNTER — Other Ambulatory Visit (INDEPENDENT_AMBULATORY_CARE_PROVIDER_SITE_OTHER): Payer: Medicare Other | Admitting: *Deleted

## 2016-01-09 DIAGNOSIS — I482 Chronic atrial fibrillation, unspecified: Secondary | ICD-10-CM

## 2016-01-09 DIAGNOSIS — I422 Other hypertrophic cardiomyopathy: Secondary | ICD-10-CM

## 2016-01-09 LAB — BASIC METABOLIC PANEL
BUN: 14 mg/dL (ref 7–25)
CALCIUM: 8.9 mg/dL (ref 8.6–10.3)
CO2: 32 mmol/L — AB (ref 20–31)
Chloride: 101 mmol/L (ref 98–110)
Creat: 1.16 mg/dL (ref 0.70–1.18)
GLUCOSE: 128 mg/dL — AB (ref 65–99)
Potassium: 3.7 mmol/L (ref 3.5–5.3)
Sodium: 142 mmol/L (ref 135–146)

## 2016-01-15 ENCOUNTER — Ambulatory Visit (INDEPENDENT_AMBULATORY_CARE_PROVIDER_SITE_OTHER): Payer: Medicare Other | Admitting: Pharmacist

## 2016-01-15 DIAGNOSIS — I4892 Unspecified atrial flutter: Secondary | ICD-10-CM

## 2016-01-15 DIAGNOSIS — I4891 Unspecified atrial fibrillation: Secondary | ICD-10-CM | POA: Diagnosis not present

## 2016-01-15 DIAGNOSIS — Z5181 Encounter for therapeutic drug level monitoring: Secondary | ICD-10-CM

## 2016-01-15 LAB — POCT INR
INR: 2.9
INR: 2.9

## 2016-01-24 ENCOUNTER — Telehealth: Payer: Self-pay | Admitting: Cardiovascular Disease

## 2016-01-24 NOTE — Telephone Encounter (Signed)
Spoke with Loma Sousa at [facility].  She has received the fax with approval to hold Coumadin starting today and restarting after his pending procedure.

## 2016-01-24 NOTE — Telephone Encounter (Signed)
Follow Up:    She wants the status of his clearance for his Colonoscopy for April 5,2017? Can pt hold his Coumadin for 5 days prior to procedure?Please fax this asap to [phone].

## 2016-01-24 NOTE — Telephone Encounter (Signed)
attempted to call Loma Sousa a [facility] with no answer.

## 2016-01-30 ENCOUNTER — Other Ambulatory Visit: Payer: Self-pay | Admitting: Gastroenterology

## 2016-02-06 ENCOUNTER — Ambulatory Visit (INDEPENDENT_AMBULATORY_CARE_PROVIDER_SITE_OTHER): Payer: Medicare Other | Admitting: *Deleted

## 2016-02-06 DIAGNOSIS — I4891 Unspecified atrial fibrillation: Secondary | ICD-10-CM

## 2016-02-06 DIAGNOSIS — I4892 Unspecified atrial flutter: Secondary | ICD-10-CM

## 2016-02-06 DIAGNOSIS — Z5181 Encounter for therapeutic drug level monitoring: Secondary | ICD-10-CM

## 2016-02-06 LAB — POCT INR: INR: 1.5

## 2016-02-09 ENCOUNTER — Other Ambulatory Visit: Payer: Self-pay | Admitting: Cardiovascular Disease

## 2016-02-11 ENCOUNTER — Other Ambulatory Visit: Payer: Self-pay | Admitting: Cardiovascular Disease

## 2016-02-15 ENCOUNTER — Ambulatory Visit (INDEPENDENT_AMBULATORY_CARE_PROVIDER_SITE_OTHER): Payer: Medicare Other | Admitting: Pharmacist

## 2016-02-15 DIAGNOSIS — Z5181 Encounter for therapeutic drug level monitoring: Secondary | ICD-10-CM | POA: Diagnosis not present

## 2016-02-15 DIAGNOSIS — I4891 Unspecified atrial fibrillation: Secondary | ICD-10-CM | POA: Diagnosis not present

## 2016-02-15 DIAGNOSIS — I4892 Unspecified atrial flutter: Secondary | ICD-10-CM

## 2016-02-15 LAB — POCT INR: INR: 1.9

## 2016-03-05 ENCOUNTER — Encounter: Payer: Self-pay | Admitting: Cardiovascular Disease

## 2016-03-07 ENCOUNTER — Ambulatory Visit (INDEPENDENT_AMBULATORY_CARE_PROVIDER_SITE_OTHER): Payer: Medicare Other | Admitting: *Deleted

## 2016-03-07 DIAGNOSIS — I4892 Unspecified atrial flutter: Secondary | ICD-10-CM | POA: Diagnosis not present

## 2016-03-07 DIAGNOSIS — I4891 Unspecified atrial fibrillation: Secondary | ICD-10-CM | POA: Diagnosis not present

## 2016-03-07 DIAGNOSIS — Z5181 Encounter for therapeutic drug level monitoring: Secondary | ICD-10-CM

## 2016-03-07 LAB — POCT INR: INR: 2.8

## 2016-03-24 ENCOUNTER — Other Ambulatory Visit: Payer: Self-pay | Admitting: Cardiovascular Disease

## 2016-04-04 ENCOUNTER — Ambulatory Visit (INDEPENDENT_AMBULATORY_CARE_PROVIDER_SITE_OTHER): Payer: Medicare Other

## 2016-04-04 DIAGNOSIS — I4892 Unspecified atrial flutter: Secondary | ICD-10-CM | POA: Diagnosis not present

## 2016-04-04 DIAGNOSIS — Z5181 Encounter for therapeutic drug level monitoring: Secondary | ICD-10-CM | POA: Diagnosis not present

## 2016-04-04 DIAGNOSIS — I4891 Unspecified atrial fibrillation: Secondary | ICD-10-CM | POA: Diagnosis not present

## 2016-04-04 LAB — POCT INR: INR: 2.9

## 2016-04-07 ENCOUNTER — Other Ambulatory Visit: Payer: Self-pay | Admitting: Cardiovascular Disease

## 2016-04-18 ENCOUNTER — Telehealth: Payer: Self-pay | Admitting: Cardiovascular Disease

## 2016-04-18 ENCOUNTER — Emergency Department (HOSPITAL_COMMUNITY)
Admission: EM | Admit: 2016-04-18 | Discharge: 2016-04-18 | Disposition: A | Discharge status: Home / Self Care | Payer: Medicare Other | Attending: Emergency Medicine | Admitting: Emergency Medicine

## 2016-04-18 ENCOUNTER — Emergency Department (HOSPITAL_BASED_OUTPATIENT_CLINIC_OR_DEPARTMENT_OTHER)
Admit: 2016-04-18 | Discharge: 2016-04-18 | Disposition: A | Discharge status: Home / Self Care | Payer: Medicare Other | Attending: Emergency Medicine | Admitting: Emergency Medicine

## 2016-04-18 ENCOUNTER — Encounter (HOSPITAL_COMMUNITY): Payer: Self-pay | Admitting: Emergency Medicine

## 2016-04-18 ENCOUNTER — Emergency Department (HOSPITAL_COMMUNITY): Payer: Medicare Other

## 2016-04-18 DIAGNOSIS — I872 Venous insufficiency (chronic) (peripheral): Secondary | ICD-10-CM | POA: Insufficient documentation

## 2016-04-18 DIAGNOSIS — I10 Essential (primary) hypertension: Secondary | ICD-10-CM | POA: Insufficient documentation

## 2016-04-18 DIAGNOSIS — I878 Other specified disorders of veins: Secondary | ICD-10-CM

## 2016-04-18 DIAGNOSIS — Z87891 Personal history of nicotine dependence: Secondary | ICD-10-CM | POA: Insufficient documentation

## 2016-04-18 DIAGNOSIS — Z7901 Long term (current) use of anticoagulants: Secondary | ICD-10-CM | POA: Diagnosis not present

## 2016-04-18 DIAGNOSIS — M7989 Other specified soft tissue disorders: Secondary | ICD-10-CM | POA: Diagnosis not present

## 2016-04-18 DIAGNOSIS — L03115 Cellulitis of right lower limb: Secondary | ICD-10-CM | POA: Diagnosis present

## 2016-04-18 DIAGNOSIS — I481 Persistent atrial fibrillation: Secondary | ICD-10-CM | POA: Diagnosis not present

## 2016-04-18 DIAGNOSIS — Z8551 Personal history of malignant neoplasm of bladder: Secondary | ICD-10-CM | POA: Insufficient documentation

## 2016-04-18 LAB — CBC WITH DIFFERENTIAL/PLATELET
BASOS ABS: 0 10*3/uL (ref 0.0–0.1)
BASOS PCT: 0 %
EOS ABS: 0.2 10*3/uL (ref 0.0–0.7)
Eosinophils Relative: 2 %
HEMATOCRIT: 43.1 % (ref 39.0–52.0)
HEMOGLOBIN: 14.2 g/dL (ref 13.0–17.0)
Lymphocytes Relative: 23 %
Lymphs Abs: 2 10*3/uL (ref 0.7–4.0)
MCH: 31.3 pg (ref 26.0–34.0)
MCHC: 32.9 g/dL (ref 30.0–36.0)
MCV: 94.9 fL (ref 78.0–100.0)
MONO ABS: 0.7 10*3/uL (ref 0.1–1.0)
Monocytes Relative: 8 %
NEUTROS ABS: 5.7 10*3/uL (ref 1.7–7.7)
NEUTROS PCT: 67 %
Platelets: 150 10*3/uL (ref 150–400)
RBC: 4.54 MIL/uL (ref 4.22–5.81)
RDW: 14.4 % (ref 11.5–15.5)
WBC: 8.5 10*3/uL (ref 4.0–10.5)

## 2016-04-18 LAB — BASIC METABOLIC PANEL
ANION GAP: 5 (ref 5–15)
BUN: 14 mg/dL (ref 6–20)
CALCIUM: 8.9 mg/dL (ref 8.9–10.3)
CO2: 31 mmol/L (ref 22–32)
CREATININE: 0.97 mg/dL (ref 0.61–1.24)
Chloride: 104 mmol/L (ref 101–111)
GFR calc non Af Amer: >60 mL/min (ref >60)
Glucose, Bld: 114 mg/dL — ABNORMAL HIGH (ref 65–99)
Potassium: 4.4 mmol/L (ref 3.5–5.1)
SODIUM: 140 mmol/L (ref 135–145)

## 2016-04-18 LAB — PROTIME-INR
INR: 2.19 — ABNORMAL HIGH (ref 0.00–1.49)
Prothrombin Time: 24.2 seconds — ABNORMAL HIGH (ref 11.6–15.2)

## 2016-04-18 MED ORDER — CLINDAMYCIN PHOSPHATE 600 MG/50ML IV SOLN
600.0000 mg | Freq: Once | INTRAVENOUS | Status: AC
  Administered 2016-04-18: 600 mg via INTRAVENOUS
  Filled 2016-04-18: qty 50

## 2016-04-18 MED ORDER — CLINDAMYCIN HCL 150 MG PO CAPS: 450.0000 mg | ORAL_CAPSULE | Freq: Three times a day (TID) | ORAL | Status: DC

## 2016-04-18 NOTE — ED Notes (Signed)
Non adherent dressing and gauze used to wrap patient's area of breakdown on right lower leg

## 2016-04-18 NOTE — Progress Notes (Signed)
*  PRELIMINARY RESULTS* Vascular Ultrasound Right lower extremity venous duplex has been completed.  Preliminary findings: No evidence of DVT or baker's cyst.  Landry Mellow, RDMS, RVT  04/18/2016, 1:29 PM

## 2016-04-18 NOTE — Telephone Encounter (Signed)
Pt has severe in his leg his PCP told him to see [name] today or go to ER and he doesn't want to go to ER-wants to come here-pls call  [phone]

## 2016-04-18 NOTE — ED Notes (Signed)
PA at bedside updating pt 

## 2016-04-18 NOTE — ED Notes (Signed)
Pt reports right lower leg cellulitis x 2 weeks which continues to get worse. Pt alert x4. NAD at this time.

## 2016-04-18 NOTE — ED Provider Notes (Signed)
CSN: [id number]     Arrival date & time 04/18/16  1135 History   First MD Initiated Contact with Patient 04/18/16 1159     Chief Complaint  Patient presents with  . Cellulitis     (Consider location/radiation/quality/duration/timing/severity/associated sxs/prior Treatment) HPI Bradley Winters is a 75 y.o. male with PMH significant for Hypertrophic cardiomyopathy, persistent atrial fibrillation, venous insufficiency, hypertension, bladder cancer who presents with gradual onset, constant, worsening right lower extremity swelling, warmth, and erythema over the last 3 weeks. He has been keeping the area clean and applying Lubriderm lotion without relief. No aggravating factors. He states that it has been draining periodically and is slightly tender to palpation. He is ambulatory. He denies any fever, chills, nausea, vomiting, chest pain, or shortness of breath. He denies any numbness or weakness of his lower extremities. He was seen earlier this morning by his PCP, and they sent him here for IV antibiotics.  Past Medical History  Diagnosis Date  . Hypertrophic cardiomyopathy ([place])     HEREDITARY--  POSITIVE for MYBPC3  GENE (HETEROZYGOUS)  . Persistent atrial fibrillation ([place])     CARDIOLOGIST-- DR [name]  AND DR [name]  . Venous insufficiency   . Thrombocytopenia ([place]) PT ASYMPTOMATIC    MILD--  HEMOTOLOGIST--  DR [name]  . Hypertension   . ED (erectile dysfunction)   . History of colonoscopy with polypectomy     BENIGN  . History of seizures as a child     FEBRILE  . Anxiety     MOSTLY AT NIGHT  . History of gastric ulcer   . Wears glasses   . Bladder cancer ([place])   . PONV (postoperative nausea and vomiting)    Past Surgical History  Procedure Laterality Date  . Cardioversion  MULTIPLE -----  BETWEEN 2008  to  07-17-2009  . Tee with cardioversion  11-27-2007  . Cardiac electrophysiology mapping and ablation  12-07-2008  &  05-08-2009  DR [name]    SUCCESSFUL ABLATION  ATRIAL-FIBRILLATION AND CARDIOVERSION  . Transthoracic echocardiogram  06-16-2013  DR COOPER    PT HAS HX HCM/  MODERATE LVH with MILO SAM/  EF 55-60%/  MILD MR/  SEVERE LAE/  MODERATE RAE  . Cataract extraction w/ intraocular lens implant Right   . Sleep study  2011    NEGATIVE  . Tonsillectomy and adenoidectomy  AS CHILD  . Transurethral resection of bladder tumor N/A 02/10/2014    Procedure: TRANSURETHRAL RESECTION OF BLADDER TUMOR (TURBT);  Surgeon: [name], MD;  Location: [facility];  Service: Urology;  Laterality: N/A;  . Transurethral resection of bladder tumor N/A 04/07/2014    Procedure: TRANSURETHRAL  BLADDER BIOPSY;  Surgeon: [name], MD;  [facility];  Service: Urology;  Laterality: N/A;   Family History  Problem Relation Age of Onset  . Colon cancer     Social History  Substance Use Topics  . Smoking status: Former Smoker -- 42 years    Types: Cigarettes    Quit date: 10/27/1998  . Smokeless tobacco: Never Used  . Alcohol Use: Yes     Comment: VERY RARE    Review of Systems All other systems negative unless otherwise stated in HPI    Allergies  Review of patient's allergies indicates no known allergies.  Home Medications   Prior to Admission medications   Medication Sig Start Date End Date Taking? Authorizing Provider  acetaminophen (TYLENOL) 325 MG tablet Take 650 mg by  mouth every 6 (six) hours as needed.      Historical Provider, MD  diltiazem (CARDIZEM CD) 360 MG 24 hr capsule TAKE ONE CAPSULE BY MOUTH EVERY DAY 04/08/16   [name], MD  furosemide (LASIX) 40 MG tablet Take 1 tablet (40 mg total) by mouth 2 (two) times daily. 12/26/15   [name], MD  metoprolol succinate (TOPROL-XL) 25 MG 24 hr tablet TAKE 1 TABLET (25 MG TOTAL) BY MOUTH DAILY. 02/11/16   [name], MD  potassium chloride SA (KLOR-CON M20) 20 MEQ tablet Take 1 tablet (20 mEq total) by mouth 2 (two) times daily. 12/26/15   [name], MD  warfarin (COUMADIN) 2 MG tablet TAKE 1 TABLET BY MOUTH AS DIRECTED BY THE COUMADIN CLINIC 03/25/16   [name], MD   BP 113/68 mmHg  Pulse 67  Temp(Src) 97.5 F (36.4 C) (Oral)  Resp 13  SpO2 96% Physical Exam  Constitutional: He is oriented to person, place, and time. He appears well-developed and well-nourished.  Non-toxic appearance. He does not have a sickly appearance. He does not appear ill.  HENT:  Head: Normocephalic and atraumatic.  Mouth/Throat: Oropharynx is clear and moist.  Eyes: Conjunctivae are normal. Pupils are equal, round, and reactive to light.  Neck: Normal range of motion. Neck supple.  Cardiovascular: Normal rate.  An irregularly irregular rhythm present.  Bilateral lower extremity 2+ edema with the right greater than the left.  Pulmonary/Chest: Effort normal and breath sounds normal. No accessory muscle usage or stridor. No respiratory distress. He has no wheezes. He has no rhonchi. He has no rales.  Abdominal: Soft. Bowel sounds are normal. He exhibits no distension. There is no tenderness.  Musculoskeletal: Normal range of motion.  Lymphadenopathy:    He has no cervical adenopathy.  Neurological: He is alert and oriented to person, place, and time.  Speech clear without dysarthria. Strength and sensation intact throughout bilateral lower extremities.  Skin: Skin is warm and dry.  Hyperpigmentation to right lower extremity, this is chronic. Mild warmth and erythema. Scattered areas with drainage.  Psychiatric: He has a normal mood and affect. His behavior is normal.        ED Course  Procedures (including critical care time) Labs Review Labs Reviewed  BASIC METABOLIC PANEL - Abnormal; Notable for the following:    Glucose, Bld 114 (*)    All other components within normal limits  PROTIME-INR - Abnormal; Notable for the following:    Prothrombin Time 24.2 (*)    INR 2.19 (*)    All other components within normal limits  CBC WITH  DIFFERENTIAL/PLATELET    Imaging Review Dg Tibia/fibula Right  04/18/2016  CLINICAL DATA:  Pt reports right lower leg cellulitis x 2 weeks which continues to get worse. EXAM: RIGHT TIBIA AND FIBULA - 2 VIEW COMPARISON:  None. FINDINGS: No fracture. No bone lesion. There is no bone resorption to suggest osteomyelitis. Knee and ankle joints are normally aligned. There is diffuse soft tissue edema/ cellulitis.  No soft tissue air. IMPRESSION: 1. Diffuse soft tissue edema/cellulitis. No soft tissue air to suggest fascitis. 2. No fracture, bone lesion or evidence of osteomyelitis. Electronically Signed   By: Lajean Manes M.D.   On: 04/18/2016 13:21   I have personally reviewed and evaluated these images and lab results as part of my medical decision-making.   EKG Interpretation None      MDM   Final diagnoses:  Venous stasis  Cellulitis of right lower extremity  Patient presents with RLE swelling, warmth, and drainage x 3 weeks.  Ambulatory.  Hx of persistent AF, HTN, bladder cancer.  VSS, NAD.  On exam, RLE with edema and drainage.  Sensation intact.  Concern for cellulitis.  Will obtain labs and plain films of RLE to evaluate for osteomyelitis.  Will also obtain RLE venous doppler to evaluate for DVT.  Labs without acute abnormalities.  Therapeutic INR.  Plain films remarkable for diffuse soft tissue edema/cellulitis.  No soft tissue air or bone lesions to suggest osteomyelitis.  LE doppler negative for DVT.  No evidence of sepsis. Given initial IV dose of Clindamycin in ED.  Plan to discharge home with Clindamycin.  Follow up PCP.  Discussed return precautions.  Patient agrees and acknowledges the above plan for discharge.  Case has been discussed with Dr. Stark Jock who agrees with the above plan for discharge.        Gloriann Loan, PA-C 04/18/16 [place], MD 04/18/16 [phone]

## 2016-04-18 NOTE — Discharge Instructions (Signed)
Cellulitis Cellulitis is an infection of the skin and the tissue beneath it. The infected area is usually red and tender. Cellulitis occurs most often in the arms and lower legs.  CAUSES  Cellulitis is caused by bacteria that enter the skin through cracks or cuts in the skin. The most common types of bacteria that cause cellulitis are staphylococci and streptococci. SIGNS AND SYMPTOMS   Redness and warmth.  Swelling.  Tenderness or pain.  Fever. DIAGNOSIS  Your health care provider can usually determine what is wrong based on a physical exam. Blood tests may also be done. TREATMENT  Treatment usually involves taking an antibiotic medicine. HOME CARE INSTRUCTIONS   Take your antibiotic medicine as directed by your health care provider. Finish the antibiotic even if you start to feel better.  Keep the infected arm or leg elevated to reduce swelling.  Apply a warm cloth to the affected area up to 4 times per day to relieve pain.  Take medicines only as directed by your health care provider.  Keep all follow-up visits as directed by your health care provider. SEEK MEDICAL CARE IF:   You notice red streaks coming from the infected area.  Your red area gets larger or turns dark in color.  Your bone or joint underneath the infected area becomes painful after the skin has healed.  Your infection returns in the same area or another area.  You notice a swollen bump in the infected area.  You develop new symptoms.  You have a fever. SEEK IMMEDIATE MEDICAL CARE IF:   You feel very sleepy.  You develop vomiting or diarrhea.  You have a general ill feeling (malaise) with muscle aches and pains.   This information is not intended to replace advice given to you by your health care provider. Make sure you discuss any questions you have with your health care provider.   Document Released: 07/23/2005 Document Revised: 07/04/2015 Document Reviewed: 12/29/2011 Elsevier Interactive  Patient Education 2016 Elsevier Inc.  Venous Stasis or Chronic Venous Insufficiency   Chronic venous insufficiency, also called venous stasis, is a condition that affects the veins in the legs. The condition prevents blood from being pumped through these veins effectively. Blood may no longer be pumped effectively from the legs back to the heart. This condition can range from mild to severe. With proper treatment, you should be able to continue with an active life.  CAUSES  Chronic venous insufficiency occurs when the vein walls become stretched, weakened, or damaged or when valves within the vein are damaged. Some common causes of this include:  High blood pressure inside the veins (venous hypertension).  Increased blood pressure in the leg veins from long periods of sitting or standing.  A blood clot that blocks blood flow in a vein (deep vein thrombosis).  Inflammation of a superficial vein (phlebitis) that causes a blood clot to form. RISK FACTORS  Various things can make you more likely to develop chronic venous insufficiency, including:  Family history of this condition.  Obesity.  Pregnancy.  Sedentary lifestyle.  Smoking.  Jobs requiring long periods of standing or sitting in one place.  Being a certain age. Women in their 69s and 46s and men in their 24s are more likely to develop this condition. SIGNS AND SYMPTOMS  Symptoms may include:  Varicose veins.  Skin breakdown or ulcers.  Reddened or discolored skin on the leg.  Brown, smooth, tight, and painful skin just above the ankle, usually on the inside  surface (lipodermatosclerosis).  Swelling. DIAGNOSIS  To diagnose this condition, your health care provider will take a medical history and do a physical exam. The following tests may be ordered to confirm the diagnosis:  Duplex ultrasound--A procedure that produces a picture of a blood vessel and nearby organs and also provides information on blood flow through the blood  vessel.  Plethysmography--A procedure that tests blood flow.  A venogram, or venography--A procedure used to look at the veins using X-ray and dye. TREATMENT  The goals of treatment are to help you return to an active life and to minimize pain or disability. Treatment will depend on the severity of the condition. Medical procedures may be needed for severe cases. Treatment options may include:  Use of compression stockings. These can help with symptoms and lower the chances of the problem getting worse, but they do not cure the problem.  Sclerotherapy--A procedure involving an injection of a material that "dissolves" the damaged veins. Other veins in the network of blood vessels take over the function of the damaged veins.  Surgery to remove the vein or cut off blood flow through the vein (vein stripping or laser ablation surgery).  Surgery to repair a valve. HOME CARE INSTRUCTIONS  Wear compression stockings as directed by your health care provider.  Only take over-the-counter or prescription medicines for pain, discomfort, or fever as directed by your health care provider.  Follow up with your health care provider as directed. SEEK MEDICAL CARE IF:  You have redness, swelling, or increasing pain in the affected area.  You see a red streak or line that extends up or down from the affected area.  You have a breakdown or loss of skin in the affected area, even if the breakdown is small.  You have an injury to the affected area. SEEK IMMEDIATE MEDICAL CARE IF:  You have an injury and open wound in the affected area.  Your pain is severe and does not improve with medicine.  You have sudden numbness or weakness in the foot or ankle below the affected area, or you have trouble moving your foot or ankle.  You have a fever or persistent symptoms for more than 2-3 days.  You have a fever and your symptoms suddenly get worse. MAKE SURE YOU:  Understand these instructions.  Will watch your condition.   Will get help right away if you are not doing well or get worse. This information is not intended to replace advice given to you by your health care provider. Make sure you discuss any questions you have with your health care provider.  Document Released: 02/16/2007 Document Revised: 08/03/2013 Document Reviewed: 06/20/2013  Elsevier Interactive Patient Education [employer].

## 2016-04-18 NOTE — Telephone Encounter (Signed)
Correct phone number [phone]  I spoke with the pt and he saw his PCP today and they advised him to go to the ER for further evaluation and treatment.  The pt states he has been having symptoms for the past 2 WEEKS of right leg swelling and warmth. The pt's symptoms have continued to worsen during this time.  The pt is currently at the door to [facility] ER and I advised him to proceed with instructions from PCP.

## 2016-06-17 ENCOUNTER — Ambulatory Visit (INDEPENDENT_AMBULATORY_CARE_PROVIDER_SITE_OTHER): Payer: Medicare Other | Admitting: *Deleted

## 2016-06-17 DIAGNOSIS — Z5181 Encounter for therapeutic drug level monitoring: Secondary | ICD-10-CM

## 2016-06-17 DIAGNOSIS — I4891 Unspecified atrial fibrillation: Secondary | ICD-10-CM

## 2016-06-17 DIAGNOSIS — I4892 Unspecified atrial flutter: Secondary | ICD-10-CM

## 2016-06-17 LAB — POCT INR: INR: 3.2

## 2016-06-29 ENCOUNTER — Other Ambulatory Visit: Payer: Self-pay | Admitting: Cardiovascular Disease

## 2016-07-01 ENCOUNTER — Ambulatory Visit (INDEPENDENT_AMBULATORY_CARE_PROVIDER_SITE_OTHER): Payer: Medicare Other | Admitting: *Deleted

## 2016-07-01 DIAGNOSIS — Z5181 Encounter for therapeutic drug level monitoring: Secondary | ICD-10-CM

## 2016-07-01 DIAGNOSIS — I4891 Unspecified atrial fibrillation: Secondary | ICD-10-CM

## 2016-07-01 DIAGNOSIS — I4892 Unspecified atrial flutter: Secondary | ICD-10-CM

## 2016-07-01 LAB — POCT INR: INR: 2.4

## 2016-07-29 ENCOUNTER — Ambulatory Visit (INDEPENDENT_AMBULATORY_CARE_PROVIDER_SITE_OTHER): Payer: Medicare Other | Admitting: Pharmacist

## 2016-07-29 DIAGNOSIS — I4891 Unspecified atrial fibrillation: Secondary | ICD-10-CM | POA: Diagnosis not present

## 2016-07-29 DIAGNOSIS — Z5181 Encounter for therapeutic drug level monitoring: Secondary | ICD-10-CM | POA: Diagnosis not present

## 2016-07-29 DIAGNOSIS — I4892 Unspecified atrial flutter: Secondary | ICD-10-CM

## 2016-07-29 LAB — POCT INR: INR: 2.5

## 2016-08-26 ENCOUNTER — Ambulatory Visit (INDEPENDENT_AMBULATORY_CARE_PROVIDER_SITE_OTHER): Payer: Medicare Other | Admitting: *Deleted

## 2016-08-26 DIAGNOSIS — I4892 Unspecified atrial flutter: Secondary | ICD-10-CM

## 2016-08-26 DIAGNOSIS — I4891 Unspecified atrial fibrillation: Secondary | ICD-10-CM

## 2016-08-26 DIAGNOSIS — Z5181 Encounter for therapeutic drug level monitoring: Secondary | ICD-10-CM

## 2016-08-26 LAB — POCT INR: INR: 2.3

## 2016-09-25 ENCOUNTER — Encounter: Payer: Self-pay | Admitting: Cardiovascular Disease

## 2016-09-25 ENCOUNTER — Ambulatory Visit (INDEPENDENT_AMBULATORY_CARE_PROVIDER_SITE_OTHER): Payer: Medicare Other | Admitting: *Deleted

## 2016-09-25 ENCOUNTER — Ambulatory Visit (INDEPENDENT_AMBULATORY_CARE_PROVIDER_SITE_OTHER): Payer: Medicare Other | Admitting: Cardiovascular Disease

## 2016-09-25 VITALS — BP 118/78 | HR 60 | Ht 71.0 in | Wt 243.8 lb

## 2016-09-25 DIAGNOSIS — I482 Chronic atrial fibrillation, unspecified: Secondary | ICD-10-CM

## 2016-09-25 DIAGNOSIS — Z23 Encounter for immunization: Secondary | ICD-10-CM

## 2016-09-25 DIAGNOSIS — I4891 Unspecified atrial fibrillation: Secondary | ICD-10-CM

## 2016-09-25 DIAGNOSIS — I421 Obstructive hypertrophic cardiomyopathy: Secondary | ICD-10-CM

## 2016-09-25 DIAGNOSIS — Z5181 Encounter for therapeutic drug level monitoring: Secondary | ICD-10-CM

## 2016-09-25 DIAGNOSIS — I5032 Chronic diastolic (congestive) heart failure: Secondary | ICD-10-CM

## 2016-09-25 DIAGNOSIS — I4892 Unspecified atrial flutter: Secondary | ICD-10-CM | POA: Diagnosis not present

## 2016-09-25 LAB — POCT INR: INR: 3

## 2016-09-25 NOTE — Progress Notes (Signed)
Cardiology Office Note Date:  09/26/2016   ID:  Bradley Winters, DOB Mar 29, 1941, MRN [id number]  PCP:  [name]  Cardiologist:  [name], MD    Chief Complaint  Patient presents with  . Atrial Fibrillation     History of Present Illness: Bradley Winters is a 75 y.o. male who presents for follow-up evaluation. He is followed for hypertrophic cardiomyopathy and chronic atrial fibrillation. Bradley Winters is doing fairly well. He is here alone today. He continues to have problems with swelling in his legs, but states that everything is stable. He has chronic venous insufficiency. He's not able to tolerate support stockings. He has been more compliant with leg elevation and avoidance of sodium. He's had no recent problems with chest pain, chest pressure, shortness of breath, lightheadedness, or heart palpitations.  Past Medical History:  Diagnosis Date  . Anxiety    MOSTLY AT NIGHT  . Bladder cancer ([place])   . ED (erectile dysfunction)   . History of colonoscopy with polypectomy    BENIGN  . History of gastric ulcer   . History of seizures as a child    FEBRILE  . Hypertension   . Hypertrophic cardiomyopathy ([place])    HEREDITARY--  POSITIVE for MYBPC3  GENE (HETEROZYGOUS)  . Persistent atrial fibrillation ([place])    CARDIOLOGIST-- DR [name]  AND DR [name]  . PONV (postoperative nausea and vomiting)   . Thrombocytopenia ([place]) PT ASYMPTOMATIC   MILD--  HEMOTOLOGIST--  DR [name]  . Venous insufficiency   . Wears glasses     Past Surgical History:  Procedure Laterality Date  . CARDIAC ELECTROPHYSIOLOGY MAPPING AND ABLATION  12-07-2008  &  05-08-2009  DR [name]   SUCCESSFUL ABLATION ATRIAL-FIBRILLATION AND CARDIOVERSION  . CARDIOVERSION  MULTIPLE -----  BETWEEN 2008  to  07-17-2009  . CATARACT EXTRACTION W/ INTRAOCULAR LENS IMPLANT Right   . SLEEP STUDY  2011   NEGATIVE  . TEE WITH CARDIOVERSION  11-27-2007  . TONSILLECTOMY AND ADENOIDECTOMY  AS CHILD  .  TRANSTHORACIC ECHOCARDIOGRAM  06-16-2013  DR Zamarion Longest   PT HAS HX HCM/  MODERATE LVH with MILO SAM/  EF 55-60%/  MILD MR/  SEVERE LAE/  MODERATE RAE  . TRANSURETHRAL RESECTION OF BLADDER TUMOR N/A 02/10/2014   Procedure: TRANSURETHRAL RESECTION OF BLADDER TUMOR (TURBT);  Surgeon: [name], MD;  Location: [facility];  Service: Urology;  Laterality: N/A;  . TRANSURETHRAL RESECTION OF BLADDER TUMOR N/A 04/07/2014   Procedure: TRANSURETHRAL  BLADDER BIOPSY;  Surgeon: [name], MD;  Location: [facility];  Service: Urology;  Laterality: N/A;    Current Outpatient Prescriptions  Medication Sig Dispense Refill  . acetaminophen (TYLENOL) 325 MG tablet Take 650 mg by mouth every 6 (six) hours as needed.      . cephALEXin (KEFLEX) 500 MG capsule Take 500 mg by mouth 4 (four) times daily.    [name] diltiazem (CARDIZEM CD) 360 MG 24 hr capsule TAKE ONE CAPSULE BY MOUTH EVERY DAY 90 capsule 2  . finasteride (PROPECIA) 1 MG tablet Take 1 mg by mouth daily.  11  . furosemide (LASIX) 40 MG tablet Take 1 tablet (40 mg total) by mouth 2 (two) times daily. 180 tablet 3  . metoprolol succinate (TOPROL-XL) 25 MG 24 hr tablet TAKE 1 TABLET (25 MG TOTAL) BY MOUTH DAILY. 30 tablet 10  . potassium chloride SA (KLOR-CON M20) 20 MEQ tablet Take 1 tablet (20 mEq total) by mouth  2 (two) times daily. 180 tablet 3  . warfarin (COUMADIN) 2 MG tablet TAKE 1 TABLET BY MOUTH AS DIRECTED BY THE COUMADIN CLINIC 30 tablet 2   No current facility-administered medications for this visit.     Allergies:   Patient has no known allergies.   Social History:  The patient  reports that he quit smoking about 17 years ago. His smoking use included Cigarettes. He quit after 42.00 years of use. He has never used smokeless tobacco. He reports that he drinks alcohol. He reports that he does not use drugs.   Family History:  The patient's  family history is not on file.    ROS:  Please see the history of  present illness.    All other systems are reviewed and negative.    PHYSICAL EXAM: VS:  BP 118/78 (BP Location: Left Arm, Patient Position: Sitting, Cuff Size: Large)   Pulse 60   Ht 5\' 11"  (1.803 m)   Wt 243 lb 12 oz (110.6 kg)   BMI 34.00 kg/m  , BMI Body mass index is 34 kg/m. GEN: Well nourished, well developed, in no acute distress  HEENT: normal  Neck: no JVD, no masses. No carotid bruits Cardiac: irregularly irregular without murmur or gallop                Respiratory:  clear to auscultation bilaterally, normal work of breathing GI: soft, nontender, nondistended, + BS MS: no deformity or atrophy  Ext: 2+ bilateral edema, pedal pulses 2+= bilaterally Skin: warm and dry, no rash Neuro:  Strength and sensation are intact Psych: euthymic mood, full affect  EKG:  EKG is ordered today. The ekg ordered today shows atrial fibrillation 79 bpm, PVC, low-voltage QRS, age-indeterminate septal infarct, ST and T-wave abnormality consider lateral ischemia. No significant change from previous.  Recent Labs: 04/18/2016: BUN 14; Creatinine, Ser 0.97; Hemoglobin 14.2; Platelets 150; Potassium 4.4; Sodium 140   Lipid Panel     Component Value Date/Time   CHOL 130 05/22/2015 0906   TRIG 93.0 05/22/2015 0906   HDL 42.10 05/22/2015 0906   CHOLHDL 3 05/22/2015 0906   VLDL 18.6 05/22/2015 0906   LDLCALC 69 05/22/2015 0906      Wt Readings from Last 3 Encounters:  09/25/16 243 lb 12 oz (110.6 kg)  12/26/15 246 lb (111.6 kg)  05/16/15 232 lb 12 oz (105.6 kg)     Cardiac Studies Reviewed: 2D Echo 05/22/2015: Study Conclusions  - Left ventricle: The cavity size was normal. Wall thickness was increased in a pattern of moderate LVH. Systolic function was normal. The estimated ejection fraction was in the range of 50% to 55%. There was a reduced contribution of atrial contraction to ventricular filling, due to increased ventricular diastolic pressure or atrial contractile  dysfunction. Doppler parameters are consistent with high ventricular filling pressure. - Mitral valve: There was mild regurgitation. - Left atrium: The atrium was moderately dilated. - Atrial septum: No defect or patent foramen ovale was identified. - Pulmonary arteries: PA peak pressure: 35 mm Hg (S). - Pericardium, extracardiac: A trivial pericardial effusion was identified posterior to the heart.  ASSESSMENT AND PLAN: 1.  Permanent atrial fibrillation:The patient is tolerating a strategy of rate control and anticoagulation. Will continue warfarin. Continue diltiazem and metoprolol succinate for heart rate control. Denies bleeding problems.  2. Hypertrophic cardiomyopathy: The patient has a nonobstructive form of hypertrophic cardiomyopathy. Will repeat an echocardiogram when he returns for follow-up in 6 months.  3. Hypertension: Continue  current therapy as blood pressure is well controlled.  4. Chronic leg edema: He continues with leg elevation and diuretic therapy. Suspect primary issue is venous insufficiency.  Current medicines are reviewed with the patient today.  The patient does not have concerns regarding medicines.  Labs/ tests ordered today include:   Orders Placed This Encounter  Procedures  . Flu Vaccine QUAD 36+ mos IM  . EKG 12-Lead  . ECHOCARDIOGRAM COMPLETE    Disposition:   FU 6 months with an echocardiogram at that time.   Deatra James, MD  09/26/2016 3:19 PM    [address] [place], [place], [place]  [address] Phone: [phone]; Fax: [phone]

## 2016-09-25 NOTE — Patient Instructions (Signed)
Medication Instructions:  Your physician recommends that you continue on your current medications as directed. Please refer to the Current Medication list given to you today.  Labwork: No new orders   Testing/Procedures: Your physician has requested that you have an echocardiogram. Echocardiography is a painless test that uses sound waves to create images of your heart. It provides your doctor with information about the size and shape of your heart and how well your heart's chambers and valves are working. This procedure takes approximately one hour. There are no restrictions for this procedure.  Follow-Up: Your physician recommends that you schedule a follow-up appointment in: 6 MONTHS with Dr [name]   Any Other Special Instructions Will Be Listed Below (If Applicable).     If you need a refill on your cardiac medications before your next appointment, please call your pharmacy.

## 2016-11-03 ENCOUNTER — Other Ambulatory Visit (HOSPITAL_COMMUNITY): Payer: Medicare Other

## 2016-11-03 ENCOUNTER — Telehealth: Payer: Self-pay | Admitting: Cardiovascular Disease

## 2016-11-03 NOTE — Telephone Encounter (Signed)
New Message   Pt has some questions regarding his current diagnosis. Would like a call back.

## 2016-11-03 NOTE — Telephone Encounter (Signed)
I spoke with the pt and he had questions about diastolic heart failure listed on his AVS from last office visit. I spoke with the pt and answered questions.  The pt has an upcoming echocardiogram scheduled.

## 2016-11-14 ENCOUNTER — Other Ambulatory Visit (HOSPITAL_COMMUNITY): Payer: Medicare Other

## 2016-11-17 ENCOUNTER — Ambulatory Visit (INDEPENDENT_AMBULATORY_CARE_PROVIDER_SITE_OTHER): Payer: Medicare Other | Admitting: *Deleted

## 2016-11-17 DIAGNOSIS — I4891 Unspecified atrial fibrillation: Secondary | ICD-10-CM

## 2016-11-17 DIAGNOSIS — Z5181 Encounter for therapeutic drug level monitoring: Secondary | ICD-10-CM

## 2016-11-17 DIAGNOSIS — I4892 Unspecified atrial flutter: Secondary | ICD-10-CM

## 2016-11-17 LAB — POCT INR: INR: 2.7

## 2016-11-24 ENCOUNTER — Other Ambulatory Visit: Payer: Self-pay

## 2016-11-24 ENCOUNTER — Ambulatory Visit (HOSPITAL_COMMUNITY): Payer: Medicare Other | Attending: Cardiology

## 2016-11-24 DIAGNOSIS — I517 Cardiomegaly: Secondary | ICD-10-CM | POA: Insufficient documentation

## 2016-11-24 DIAGNOSIS — I5032 Chronic diastolic (congestive) heart failure: Secondary | ICD-10-CM | POA: Diagnosis not present

## 2016-11-24 DIAGNOSIS — I482 Chronic atrial fibrillation, unspecified: Secondary | ICD-10-CM

## 2016-11-24 DIAGNOSIS — I421 Obstructive hypertrophic cardiomyopathy: Secondary | ICD-10-CM | POA: Diagnosis not present

## 2016-12-26 ENCOUNTER — Other Ambulatory Visit: Payer: Self-pay | Admitting: Cardiovascular Disease

## 2016-12-27 ENCOUNTER — Other Ambulatory Visit: Payer: Self-pay | Admitting: Cardiovascular Disease

## 2016-12-27 DIAGNOSIS — I422 Other hypertrophic cardiomyopathy: Secondary | ICD-10-CM

## 2016-12-27 DIAGNOSIS — I482 Chronic atrial fibrillation, unspecified: Secondary | ICD-10-CM

## 2016-12-29 ENCOUNTER — Ambulatory Visit (INDEPENDENT_AMBULATORY_CARE_PROVIDER_SITE_OTHER): Payer: Medicare Other | Admitting: *Deleted

## 2016-12-29 ENCOUNTER — Other Ambulatory Visit: Payer: Self-pay | Admitting: Cardiovascular Disease

## 2016-12-29 DIAGNOSIS — Z5181 Encounter for therapeutic drug level monitoring: Secondary | ICD-10-CM

## 2016-12-29 DIAGNOSIS — I4891 Unspecified atrial fibrillation: Secondary | ICD-10-CM | POA: Diagnosis not present

## 2016-12-29 DIAGNOSIS — I4892 Unspecified atrial flutter: Secondary | ICD-10-CM | POA: Diagnosis not present

## 2016-12-29 LAB — POCT INR: INR: 2.9

## 2017-01-20 ENCOUNTER — Telehealth: Payer: Self-pay | Admitting: Cardiovascular Disease

## 2017-01-20 DIAGNOSIS — Z79899 Other long term (current) drug therapy: Secondary | ICD-10-CM

## 2017-01-20 NOTE — Telephone Encounter (Signed)
New Message   Pt went to PCP yesterday & they prescribed him an antibiotic for osteomyelitis, and was instructed to call cardiologist to inform them of medication

## 2017-01-20 NOTE — Telephone Encounter (Signed)
Patient calling back and states that the medication was spironolactone 25 mg 1 tablet daily for 14 days. Spoke with Eino Farber, [facility] and she advised for patient to continue current meds and have a BMET in 1 week. BMET ordered and lab appointment made on 01/27/17. Patient verbalized understanding.

## 2017-01-20 NOTE — Telephone Encounter (Signed)
Patient calling and states that he was prescribed a medication for his cellulitis. He states that the pharmacy told him that it could possibly cause hyperkalemia, and was instructed to contact his cardiologist. Patient was unable to give the name of the medication. Patient states that he will find out what the name of the medication is and call back.

## 2017-01-26 ENCOUNTER — Telehealth: Payer: Self-pay

## 2017-01-26 NOTE — Telephone Encounter (Signed)
Called pt notified ok to hold Coumadin x 5 days prior to colonoscopy as instructed by Dr Amedeo Plenty, GI MD office.  Made pt f/u appt on 02/23/17 at 3:00pm pt aware of f/u appt date.

## 2017-01-26 NOTE — Telephone Encounter (Signed)
Pt called states he is scheduled for a colonoscopy on 02/13/17 with Dr Amedeo Plenty.  They have instructed him to hold Coumadin x 5 days prior to procedure, last dosage of Coumadin on 02/08/17.  Will forward clearance for pt to hold Coumadin to our pharmacist. Pt has afib, denies any hx of stroke or clot.

## 2017-01-26 NOTE — Telephone Encounter (Signed)
Pt on warfarin for Afib with CHADS <4 and no history of stroke. Ok to hold 5 days prior to procedure.

## 2017-01-27 ENCOUNTER — Other Ambulatory Visit: Payer: Medicare Other

## 2017-01-28 ENCOUNTER — Other Ambulatory Visit: Payer: Medicare Other | Admitting: *Deleted

## 2017-01-28 DIAGNOSIS — Z79899 Other long term (current) drug therapy: Secondary | ICD-10-CM

## 2017-01-29 LAB — BASIC METABOLIC PANEL
BUN / CREAT RATIO: 14 (ref 10–24)
BUN: 17 mg/dL (ref 8–27)
CHLORIDE: 98 mmol/L (ref 96–106)
CO2: 28 mmol/L (ref 18–29)
Calcium: 9.1 mg/dL (ref 8.6–10.2)
Creatinine, Ser: 1.18 mg/dL (ref 0.76–1.27)
GFR calc Af Amer: 69 mL/min/{1.73_m2} (ref >59)
GFR calc non Af Amer: 60 mL/min/{1.73_m2} (ref >59)
GLUCOSE: 88 mg/dL (ref 65–99)
Potassium: 4.1 mmol/L (ref 3.5–5.2)
SODIUM: 142 mmol/L (ref 134–144)

## 2017-02-03 ENCOUNTER — Telehealth: Payer: Self-pay | Admitting: Cardiovascular Disease

## 2017-02-03 DIAGNOSIS — I421 Obstructive hypertrophic cardiomyopathy: Secondary | ICD-10-CM

## 2017-02-03 DIAGNOSIS — I4891 Unspecified atrial fibrillation: Secondary | ICD-10-CM

## 2017-02-03 NOTE — Telephone Encounter (Signed)
Patient states that is returning your call. Patient will be available after 3:30 pm today. Thanks.

## 2017-02-03 NOTE — Telephone Encounter (Signed)
I spoke with the pt and he was given Spironolactone 12.5mg  daily for 14 days to treat swelling in lower extremities and weight gain (251 lbs) per Urgent Care. The pt wants to know if this is a medication he can take on a normal basis.  His swelling has decreased dramatically and weight is now in the 230s.  The pt did have BMP with this medication and labs are stable.  I will forward this message to Dr [name] for further review.

## 2017-02-04 MED ORDER — SPIRONOLACTONE 25 MG PO TABS: 12.5000 mg | ORAL_TABLET | Freq: Every day | ORAL | 3 refills | Status: DC

## 2017-02-04 NOTE — Telephone Encounter (Signed)
Yes would be fine for him to continue on this. Would repeat BMET again in 30 days.

## 2017-02-04 NOTE — Telephone Encounter (Signed)
I spoke with the pt and made him aware that he can continue spironolactone 12.5mg  daily.  Rx sent to pharmacy and will plan to check BMP 03/06/17.

## 2017-02-23 ENCOUNTER — Ambulatory Visit (INDEPENDENT_AMBULATORY_CARE_PROVIDER_SITE_OTHER): Payer: Medicare Other | Admitting: *Deleted

## 2017-02-23 ENCOUNTER — Telehealth: Payer: Self-pay

## 2017-02-23 DIAGNOSIS — Z5181 Encounter for therapeutic drug level monitoring: Secondary | ICD-10-CM | POA: Diagnosis not present

## 2017-02-23 DIAGNOSIS — I4892 Unspecified atrial flutter: Secondary | ICD-10-CM | POA: Diagnosis not present

## 2017-02-23 DIAGNOSIS — I4891 Unspecified atrial fibrillation: Secondary | ICD-10-CM

## 2017-02-23 DIAGNOSIS — I421 Obstructive hypertrophic cardiomyopathy: Secondary | ICD-10-CM

## 2017-02-23 LAB — POCT INR: INR: 2.5

## 2017-02-23 NOTE — Telephone Encounter (Signed)
I spoke with the pt and he is taking spironolactone 12.5mg  daily. The pt said he feels like this medication is not working as well as when he initially started it.  The pt denies SOB but his weight has increased to 240 lbs today and the tops of his feet are puffy.  The pt's weight has normally been running 235-238 lbs.  I spoke with the pt about decreasing his salt intake. The pt would like to know if he can increase Spironolactone to 25mg  daily. The pt has a pending BMP on 03/06/17. I will forward this information to Dr [name] for review and the pt would like me to call him back on Wednesday.

## 2017-02-25 NOTE — Telephone Encounter (Signed)
Ok to increase aldactone to 25 mg daily

## 2017-02-26 MED ORDER — SPIRONOLACTONE 25 MG PO TABS: 25.0000 mg | ORAL_TABLET | Freq: Every day | ORAL | 3 refills | Status: DC

## 2017-02-26 NOTE — Telephone Encounter (Signed)
I spoke with the pt and made him aware he can increase Spironolactone to 25mg  daily and continue with plan for BMP on 03/06/17.  Pt agreed with plan and new Rx sent to the pharmacy.

## 2017-03-06 ENCOUNTER — Other Ambulatory Visit: Payer: Medicare Other | Admitting: *Deleted

## 2017-03-06 DIAGNOSIS — I4891 Unspecified atrial fibrillation: Secondary | ICD-10-CM

## 2017-03-06 DIAGNOSIS — I421 Obstructive hypertrophic cardiomyopathy: Secondary | ICD-10-CM

## 2017-03-07 LAB — BASIC METABOLIC PANEL
BUN/Creatinine Ratio: 12 (ref 10–24)
BUN: 13 mg/dL (ref 8–27)
CHLORIDE: 97 mmol/L (ref 96–106)
CO2: 27 mmol/L (ref 18–29)
CREATININE: 1.11 mg/dL (ref 0.76–1.27)
Calcium: 9.2 mg/dL (ref 8.6–10.2)
GFR calc Af Amer: 74 mL/min/{1.73_m2} (ref >59)
GFR calc non Af Amer: 64 mL/min/{1.73_m2} (ref >59)
GLUCOSE: 100 mg/dL — AB (ref 65–99)
Potassium: 4.5 mmol/L (ref 3.5–5.2)
Sodium: 138 mmol/L (ref 134–144)

## 2017-03-11 ENCOUNTER — Other Ambulatory Visit: Payer: Self-pay | Admitting: Cardiovascular Disease

## 2017-03-11 DIAGNOSIS — I422 Other hypertrophic cardiomyopathy: Secondary | ICD-10-CM

## 2017-03-11 DIAGNOSIS — I482 Chronic atrial fibrillation, unspecified: Secondary | ICD-10-CM

## 2017-03-13 ENCOUNTER — Encounter: Payer: Self-pay | Admitting: *Deleted

## 2017-03-16 ENCOUNTER — Telehealth: Payer: Self-pay | Admitting: Cardiovascular Disease

## 2017-03-16 NOTE — Telephone Encounter (Signed)
New message     Pt is broken out in hives, wants to know if its ok for him to take benadryl

## 2017-03-16 NOTE — Telephone Encounter (Signed)
I spoke with the pt and he currently has hives on his arms and legs.  The pt states he woke up around 3:30 AM this morning itching. The pt is unsure of why he has hives.  The pt called the office because he would like to know if he can take Benadryl.  I advised him that he can take this medication as needed and follow dosing instructions on bottle.  The pt is not having any difficulty breathing at this time.  I advised him to proceed to ER with any difficulty breathing. The pt will contact the office with any additional questions or concerns.

## 2017-04-03 ENCOUNTER — Ambulatory Visit (INDEPENDENT_AMBULATORY_CARE_PROVIDER_SITE_OTHER): Payer: Medicare Other | Admitting: *Deleted

## 2017-04-03 ENCOUNTER — Encounter: Payer: Self-pay | Admitting: Cardiovascular Disease

## 2017-04-03 ENCOUNTER — Ambulatory Visit (INDEPENDENT_AMBULATORY_CARE_PROVIDER_SITE_OTHER): Payer: Medicare Other | Admitting: Cardiovascular Disease

## 2017-04-03 VITALS — BP 108/60 | HR 75 | Ht 71.0 in | Wt 232.1 lb

## 2017-04-03 DIAGNOSIS — I5032 Chronic diastolic (congestive) heart failure: Secondary | ICD-10-CM | POA: Diagnosis not present

## 2017-04-03 DIAGNOSIS — I4892 Unspecified atrial flutter: Secondary | ICD-10-CM | POA: Diagnosis not present

## 2017-04-03 DIAGNOSIS — I4891 Unspecified atrial fibrillation: Secondary | ICD-10-CM

## 2017-04-03 DIAGNOSIS — I421 Obstructive hypertrophic cardiomyopathy: Secondary | ICD-10-CM

## 2017-04-03 DIAGNOSIS — Z5181 Encounter for therapeutic drug level monitoring: Secondary | ICD-10-CM

## 2017-04-03 DIAGNOSIS — I482 Chronic atrial fibrillation, unspecified: Secondary | ICD-10-CM

## 2017-04-03 LAB — POCT INR: INR: 2.8

## 2017-04-03 NOTE — Progress Notes (Signed)
Cardiology Office Note Date:  04/06/2017   ID:  Bradley Winters, DOB Jan 25, 1941, MRN [id number]  PCP:  System, Provider Not In  Cardiologist:  [name], MD    Chief Complaint  Patient presents with  . Atrial Fibrillation     History of Present Illness: Bradley Winters is a 76 y.o. male who presents for follow-up evaluation. He is followed for hypertrophic cardiomyopathy and chronic atrial fibrillation.  The patient is here alone today. He is doing well from a cardiac perspective. He was started on Spironolactone and his leg edema has greatly improved. He denies orthopnea or PND he is tolerating without bleeding problems. He recently has had problems with nasal congestion and cold symptoms. He hasn't seen his primary care physician. He denies chest pain, chest pressure, lightheadedness, or syncope. No heart palpitations.  Past Medical History:  Diagnosis Date  . Anxiety    MOSTLY AT NIGHT  . Bladder cancer ([place])   . ED (erectile dysfunction)   . History of colonoscopy with polypectomy    BENIGN  . History of gastric ulcer   . History of seizures as a child    FEBRILE  . Hypertension   . Hypertrophic cardiomyopathy ([place])    HEREDITARY--  POSITIVE for MYBPC3  GENE (HETEROZYGOUS)  . Persistent atrial fibrillation ([place])    CARDIOLOGIST-- DR [name]  AND DR [name]  . PONV (postoperative nausea and vomiting)   . Thrombocytopenia ([place]) PT ASYMPTOMATIC   MILD--  HEMOTOLOGIST--  DR [name]  . Venous insufficiency   . Wears glasses     Past Surgical History:  Procedure Laterality Date  . CARDIAC ELECTROPHYSIOLOGY MAPPING AND ABLATION  12-07-2008  &  05-08-2009  DR [name]   SUCCESSFUL ABLATION ATRIAL-FIBRILLATION AND CARDIOVERSION  . CARDIOVERSION  MULTIPLE -----  BETWEEN 2008  to  07-17-2009  . CATARACT EXTRACTION W/ INTRAOCULAR LENS IMPLANT Right   . SLEEP STUDY  2011   NEGATIVE  . TEE WITH CARDIOVERSION  11-27-2007  . TONSILLECTOMY AND ADENOIDECTOMY  AS CHILD  .  TRANSTHORACIC ECHOCARDIOGRAM  06-16-2013  DR Jordyn Doane   PT HAS HX HCM/  MODERATE LVH with MILO SAM/  EF 55-60%/  MILD MR/  SEVERE LAE/  MODERATE RAE  . TRANSURETHRAL RESECTION OF BLADDER TUMOR N/A 02/10/2014   Procedure: TRANSURETHRAL RESECTION OF BLADDER TUMOR (TURBT);  Surgeon: [name], MD;  Location: [facility];  Service: Urology;  Laterality: N/A;  . TRANSURETHRAL RESECTION OF BLADDER TUMOR N/A 04/07/2014   Procedure: TRANSURETHRAL  BLADDER BIOPSY;  Surgeon: [name], MD;  Location: [facility];  Service: Urology;  Laterality: N/A;    Current Outpatient Prescriptions  Medication Sig Dispense Refill  . acetaminophen (TYLENOL) 325 MG tablet Take 650 mg by mouth every 6 (six) hours as needed.      . diltiazem (CARDIZEM CD) 360 MG 24 hr capsule TAKE ONE CAPSULE BY MOUTH EVERY DAY 90 capsule 2  . finasteride (PROPECIA) 1 MG tablet Take 1 mg by mouth daily.  11  . furosemide (LASIX) 40 MG tablet TAKE 1 TABLET (40 MG TOTAL) BY MOUTH 2 (TWO) TIMES DAILY. 180 tablet 2  . KLOR-CON M20 20 MEQ tablet TAKE 1 TABLET (20 MEQ TOTAL) BY MOUTH 2 (TWO) TIMES DAILY. 180 tablet 0  . metoprolol succinate (TOPROL-XL) 25 MG 24 hr tablet TAKE 1 TABLET BY MOUTH EVERY DAY 30 tablet 7  . spironolactone (ALDACTONE) 25 MG tablet Take 1 tablet (25 mg total) by mouth  daily. 30 tablet 3  . warfarin (COUMADIN) 2 MG tablet TAKE 1 TABLET BY MOUTH AS DIRECTED BY THE COUMADIN CLINIC 30 tablet 3   No current facility-administered medications for this visit.     Allergies:   Patient has no known allergies.   Social History:  The patient  reports that he quit smoking about 18 years ago. His smoking use included Cigarettes. He quit after 42.00 years of use. He has never used smokeless tobacco. He reports that he drinks alcohol. He reports that he does not use drugs.   Family History:  The patient's family history is not on file.    ROS:  Please see the history of present illness.   Otherwise, review of systems is positive for headaches.  All other systems are reviewed and negative.    PHYSICAL EXAM: VS:  BP 108/60   Pulse 75   Ht 5\' 11"  (1.803 m)   Wt 232 lb 1.9 oz (105.3 kg)   SpO2 95%   BMI 32.37 kg/m  , BMI Body mass index is 32.37 kg/m. GEN: Well nourished, well developed, in no acute distress  HEENT: normal  Neck: no JVD, no masses. No carotid bruits Cardiac: irregularly irregular without murmur or gallop    Respiratory:  clear to auscultation bilaterally, normal work of breathing GI: soft, nontender, nondistended, + BS MS: no deformity or atrophy  Ext: 1+ bilateral pretibial edema with chronic stasis dermatitis, pedal pulses 2+= bilaterally Skin: warm and dry, no rash Neuro:  Strength and sensation are intact Psych: euthymic mood, full affect  EKG:  EKG is ordered today. The ekg ordered today shows atrial fibrillation 68 bpm, nonspecific IVCD, possible age-indeterminate anteroseptal infarct, low voltage QRS  Recent Labs: 04/18/2016: Hemoglobin 14.2; Platelets 150 04/03/2017: BUN 18; Creatinine, Ser 1.25; Potassium 4.2; Sodium 141   Lipid Panel     Component Value Date/Time   CHOL 130 05/22/2015 0906   TRIG 93.0 05/22/2015 0906   HDL 42.10 05/22/2015 0906   CHOLHDL 3 05/22/2015 0906   VLDL 18.6 05/22/2015 0906   LDLCALC 69 05/22/2015 0906      Wt Readings from Last 3 Encounters:  04/03/17 232 lb 1.9 oz (105.3 kg)  09/25/16 243 lb 12 oz (110.6 kg)  12/26/15 246 lb (111.6 kg)     Cardiac Studies Reviewed: 2D Echo 11-24-2016: Study Conclusions  - Left ventricle: The cavity size was normal. Wall thickness was   increased in a pattern of moderate LVH. Systolic function was   normal. The estimated ejection fraction was in the range of 55%   to 60%. Wall motion was normal; there were no regional wall   motion abnormalities. - Aortic valve: There was trivial regurgitation. - Aortic root: The aortic root was mildly dilated. - Mitral valve:  There was mild regurgitation. - Left atrium: The atrium was severely dilated. - Right atrium: The atrium was moderately dilated. - Pulmonary arteries: Systolic pressure was mildly increased. PA   peak pressure: 35 mm Hg (S).  Impressions:  - Normal LV function; trace AI; biatrial enlargement; mild MR and   TR; mildly elevated pulmonary pressure.  ASSESSMENT AND PLAN: 1.  Permanent atrial fibrillation:Tolerating anticoagulation with warfarin. Heart rate remains well-controlled. Continue Cardizem CD 360 mg daily and Toprol-XL 25 mg daily.  2. Hypertrophic cardiomyopathy, nonobstructive form: Tolerating the combination of diltiazem and metoprolol succinate. Continue same without change. Most recent echo is reviewed. Will repeat when he returns in 6 months.  3. Hypertension: Blood pressure is  well controlled on current medications  4. Chronic leg edema: Improved on Spirinolactone. We'll check a metabolic panel today to make sure that his potassium level is not elevated   Current medicines are reviewed with the patient today.  The patient does not have concerns regarding medicines.  Labs/ tests ordered today include:   Orders Placed This Encounter  Procedures  . Basic metabolic panel  . Basic metabolic panel  . EKG 12-Lead  . ECHOCARDIOGRAM COMPLETE    Disposition:   FU 6 months  Signed, [name], MD  04/06/2017 10:23 PM    [facility] [place], [place],   [address] Phone: [phone]; Fax: [phone]

## 2017-04-03 NOTE — Patient Instructions (Signed)
Medication Instructions:  Your physician recommends that you continue on your current medications as directed. Please refer to the Current Medication list given to you today.  Labwork: Your physician recommends that you have lab work today: BMP  Your physician recommends that you return for lab work in: 6 MONTHS for BMP  Testing/Procedures: Your physician has requested that you have an echocardiogram in 6 MONTHS. Echocardiography is a painless test that uses sound waves to create images of your heart. It provides your doctor with information about the size and shape of your heart and how well your heart's chambers and valves are working. This procedure takes approximately one hour. There are no restrictions for this procedure.  Follow-Up: Your physician wants you to follow-up in: 6 MONTHS with Dr [name]. You will receive a reminder letter in the mail two months in advance. If you don't receive a letter, please call our office to schedule the follow-up appointment.   Any Other Special Instructions Will Be Listed Below (If Applicable).     If you need a refill on your cardiac medications before your next appointment, please call your pharmacy.

## 2017-04-04 LAB — BASIC METABOLIC PANEL
BUN / CREAT RATIO: 14 (ref 10–24)
BUN: 18 mg/dL (ref 8–27)
CO2: 27 mmol/L (ref 18–29)
Calcium: 9.2 mg/dL (ref 8.6–10.2)
Chloride: 97 mmol/L (ref 96–106)
Creatinine, Ser: 1.25 mg/dL (ref 0.76–1.27)
GFR calc Af Amer: 64 mL/min/{1.73_m2} (ref >59)
GFR calc non Af Amer: 56 mL/min/{1.73_m2} — ABNORMAL LOW (ref >59)
GLUCOSE: 101 mg/dL — AB (ref 65–99)
POTASSIUM: 4.2 mmol/L (ref 3.5–5.2)
SODIUM: 141 mmol/L (ref 134–144)

## 2017-04-15 ENCOUNTER — Other Ambulatory Visit: Payer: Self-pay | Admitting: Cardiovascular Disease

## 2017-04-15 DIAGNOSIS — I421 Obstructive hypertrophic cardiomyopathy: Secondary | ICD-10-CM

## 2017-04-15 DIAGNOSIS — I4891 Unspecified atrial fibrillation: Secondary | ICD-10-CM

## 2017-04-15 MED ORDER — SPIRONOLACTONE 25 MG PO TABS: 25.0000 mg | ORAL_TABLET | Freq: Every day | ORAL | 3 refills | Status: DC

## 2017-04-25 ENCOUNTER — Other Ambulatory Visit: Payer: Self-pay | Admitting: Cardiovascular Disease

## 2017-05-15 ENCOUNTER — Ambulatory Visit (INDEPENDENT_AMBULATORY_CARE_PROVIDER_SITE_OTHER): Payer: Medicare Other | Admitting: *Deleted

## 2017-05-15 DIAGNOSIS — I4892 Unspecified atrial flutter: Secondary | ICD-10-CM | POA: Diagnosis not present

## 2017-05-15 DIAGNOSIS — I4891 Unspecified atrial fibrillation: Secondary | ICD-10-CM | POA: Diagnosis not present

## 2017-05-15 DIAGNOSIS — Z5181 Encounter for therapeutic drug level monitoring: Secondary | ICD-10-CM | POA: Diagnosis not present

## 2017-05-15 LAB — POCT INR: INR: 2.9

## 2017-05-18 ENCOUNTER — Telehealth: Payer: Self-pay | Admitting: *Deleted

## 2017-05-18 ENCOUNTER — Telehealth: Payer: Self-pay | Admitting: Cardiovascular Disease

## 2017-05-18 NOTE — Telephone Encounter (Signed)
See anticoagulation note from when pt called [place], CVRR

## 2017-05-18 NOTE — Telephone Encounter (Signed)
Pt calls to inform us that he is having a colonoscopy on Friday with Dr Amedeo Plenty. Pt took last dosage on Sunday. Routing note to pharmacist for clearance.

## 2017-05-18 NOTE — Telephone Encounter (Signed)
Pt on warfarin for Afib with CHADS<4 and no history of stroke. Per protocol ok to hold 5 days prior to procedure.

## 2017-05-18 NOTE — Telephone Encounter (Signed)
New message    Pt is calling stating he is having colonoscopy on Friday. He said he stopped his coumadin today. He is asking for a call back. He said he was not sure if that office contacted our office for clearance.

## 2017-06-26 ENCOUNTER — Ambulatory Visit (INDEPENDENT_AMBULATORY_CARE_PROVIDER_SITE_OTHER): Payer: Medicare Other

## 2017-06-26 ENCOUNTER — Other Ambulatory Visit: Payer: Self-pay | Admitting: Cardiovascular Disease

## 2017-06-26 DIAGNOSIS — I4892 Unspecified atrial flutter: Secondary | ICD-10-CM

## 2017-06-26 DIAGNOSIS — Z5181 Encounter for therapeutic drug level monitoring: Secondary | ICD-10-CM

## 2017-06-26 DIAGNOSIS — I4891 Unspecified atrial fibrillation: Secondary | ICD-10-CM | POA: Diagnosis not present

## 2017-06-26 DIAGNOSIS — I482 Chronic atrial fibrillation, unspecified: Secondary | ICD-10-CM

## 2017-06-26 DIAGNOSIS — I422 Other hypertrophic cardiomyopathy: Secondary | ICD-10-CM

## 2017-06-26 LAB — POCT INR: INR: 3.5

## 2017-07-24 ENCOUNTER — Ambulatory Visit (INDEPENDENT_AMBULATORY_CARE_PROVIDER_SITE_OTHER): Payer: Medicare Other | Admitting: Pharmacist

## 2017-07-24 DIAGNOSIS — I4892 Unspecified atrial flutter: Secondary | ICD-10-CM

## 2017-07-24 DIAGNOSIS — I4891 Unspecified atrial fibrillation: Secondary | ICD-10-CM

## 2017-07-24 DIAGNOSIS — Z5181 Encounter for therapeutic drug level monitoring: Secondary | ICD-10-CM | POA: Diagnosis not present

## 2017-07-24 LAB — POCT INR: INR: 2.4

## 2017-08-20 ENCOUNTER — Other Ambulatory Visit: Payer: Self-pay | Admitting: Cardiovascular Disease

## 2017-08-28 ENCOUNTER — Ambulatory Visit (INDEPENDENT_AMBULATORY_CARE_PROVIDER_SITE_OTHER): Payer: Medicare Other | Admitting: Pharmacist

## 2017-08-28 DIAGNOSIS — I4891 Unspecified atrial fibrillation: Secondary | ICD-10-CM

## 2017-08-28 DIAGNOSIS — Z5181 Encounter for therapeutic drug level monitoring: Secondary | ICD-10-CM

## 2017-08-28 DIAGNOSIS — I4892 Unspecified atrial flutter: Secondary | ICD-10-CM | POA: Diagnosis not present

## 2017-08-28 LAB — PROTIME-INR
INR: 4.5 — ABNORMAL HIGH (ref 0.8–1.2)
Prothrombin Time: 47.7 s — ABNORMAL HIGH (ref 9.1–12.0)

## 2017-08-28 LAB — POCT INR: INR: 5.1

## 2017-09-20 ENCOUNTER — Other Ambulatory Visit: Payer: Self-pay | Admitting: Cardiovascular Disease

## 2017-09-25 ENCOUNTER — Other Ambulatory Visit: Payer: Self-pay | Admitting: Cardiovascular Disease

## 2017-09-25 DIAGNOSIS — I482 Chronic atrial fibrillation, unspecified: Secondary | ICD-10-CM

## 2017-09-25 DIAGNOSIS — I422 Other hypertrophic cardiomyopathy: Secondary | ICD-10-CM

## 2017-10-01 ENCOUNTER — Other Ambulatory Visit (HOSPITAL_COMMUNITY): Payer: Medicare Other

## 2017-10-23 ENCOUNTER — Encounter (INDEPENDENT_AMBULATORY_CARE_PROVIDER_SITE_OTHER): Payer: Self-pay

## 2017-10-23 ENCOUNTER — Ambulatory Visit (INDEPENDENT_AMBULATORY_CARE_PROVIDER_SITE_OTHER): Payer: Medicare Other | Admitting: *Deleted

## 2017-10-23 ENCOUNTER — Ambulatory Visit (HOSPITAL_COMMUNITY): Payer: Medicare Other | Attending: Internal Medicine

## 2017-10-23 ENCOUNTER — Other Ambulatory Visit: Payer: Self-pay

## 2017-10-23 DIAGNOSIS — I4892 Unspecified atrial flutter: Secondary | ICD-10-CM

## 2017-10-23 DIAGNOSIS — I482 Chronic atrial fibrillation, unspecified: Secondary | ICD-10-CM

## 2017-10-23 DIAGNOSIS — I421 Obstructive hypertrophic cardiomyopathy: Secondary | ICD-10-CM | POA: Diagnosis not present

## 2017-10-23 DIAGNOSIS — Z5181 Encounter for therapeutic drug level monitoring: Secondary | ICD-10-CM

## 2017-10-23 DIAGNOSIS — I081 Rheumatic disorders of both mitral and tricuspid valves: Secondary | ICD-10-CM | POA: Diagnosis not present

## 2017-10-23 DIAGNOSIS — I4891 Unspecified atrial fibrillation: Secondary | ICD-10-CM

## 2017-10-23 DIAGNOSIS — I5032 Chronic diastolic (congestive) heart failure: Secondary | ICD-10-CM | POA: Diagnosis present

## 2017-10-23 LAB — ECHOCARDIOGRAM COMPLETE
CHL CUP TV REG PEAK VELOCITY: 318 cm/s
FS: 38 % (ref 28–44)
IV/PV OW: 1.03
LA diam end sys: 49 mm
LA diam index: 2.18 cm/m2
LA vol index: 74.7 mL/m2
LA vol: 168 mL
LASIZE: 49 mm
LAVOLA4C: 168 mL
LDCA: 3.8 cm2
LV PW d: 15.7 mm — AB (ref 0.6–1.1)
LVOT diameter: 22 mm
RV sys press: 43 mmHg
TR max vel: 318 cm/s

## 2017-10-23 LAB — POCT INR: INR: 4

## 2017-10-23 NOTE — Patient Instructions (Signed)
Description   Do not take any Coumadin tomorrow then start taking 1/2 tablet daily. Recheck INR in 2 weeks. Have a dark green leafy veggie today & start eating one weekly. Coumadin Clinic [phone] Main [phone]

## 2017-11-06 ENCOUNTER — Ambulatory Visit (INDEPENDENT_AMBULATORY_CARE_PROVIDER_SITE_OTHER): Payer: Medicare Other | Admitting: Pharmacist

## 2017-11-06 DIAGNOSIS — Z5181 Encounter for therapeutic drug level monitoring: Secondary | ICD-10-CM

## 2017-11-06 DIAGNOSIS — I4892 Unspecified atrial flutter: Secondary | ICD-10-CM | POA: Diagnosis not present

## 2017-11-06 DIAGNOSIS — I4891 Unspecified atrial fibrillation: Secondary | ICD-10-CM

## 2017-11-06 LAB — POCT INR: INR: 2.6

## 2017-11-06 NOTE — Patient Instructions (Signed)
Description   Continue taking 1/2 tablet daily. Recheck INR in 3 weeks. Have a dark green leafy veggie today & start eating one weekly. Coumadin Clinic [phone].

## 2017-11-09 ENCOUNTER — Ambulatory Visit (INDEPENDENT_AMBULATORY_CARE_PROVIDER_SITE_OTHER): Payer: Medicare Other | Admitting: Cardiovascular Disease

## 2017-11-09 ENCOUNTER — Encounter: Payer: Self-pay | Admitting: Cardiovascular Disease

## 2017-11-09 VITALS — BP 122/58 | HR 78 | Ht 71.0 in | Wt 233.1 lb

## 2017-11-09 DIAGNOSIS — I482 Chronic atrial fibrillation: Secondary | ICD-10-CM

## 2017-11-09 DIAGNOSIS — I4821 Permanent atrial fibrillation: Secondary | ICD-10-CM

## 2017-11-09 NOTE — Progress Notes (Signed)
Cardiology Office Note Date:  11/11/2017   ID:  Bradley Winters, DOB 22-Nov-1940, MRN [id number]  PCP:  System, Provider Not In  Cardiologist:  [name], MD    Chief Complaint  Patient presents with  . Shortness of Breath     History of Present Illness: Bradley Winters is a 77 y.o. male who presents for follow-up evaluation. He is followed for hypertrophic cardiomyopathy and chronic atrial fibrillation.  He's here alone today. Overall doing ok. Still having leg swelling but not elevating his legs regularly. No shortness of breath or chest pain. No orthopnea, PND, or palpitations. No lightheadedness or presyncope. He has been concerned because his FitBit has demonstrated HR > 100 bpm when he's walking.    Past Medical History:  Diagnosis Date  . Anxiety    MOSTLY AT NIGHT  . Bladder cancer ([place])   . ED (erectile dysfunction)   . History of colonoscopy with polypectomy    BENIGN  . History of gastric ulcer   . History of seizures as a child    FEBRILE  . Hypertension   . Hypertrophic cardiomyopathy ([place])    HEREDITARY--  POSITIVE for MYBPC3  GENE (HETEROZYGOUS)  . Persistent atrial fibrillation ([place])    CARDIOLOGIST-- DR [name]  AND DR [name]  . PONV (postoperative nausea and vomiting)   . Thrombocytopenia ([place]) PT ASYMPTOMATIC   MILD--  HEMOTOLOGIST--  DR [name]  . Venous insufficiency   . Wears glasses     Past Surgical History:  Procedure Laterality Date  . CARDIAC ELECTROPHYSIOLOGY MAPPING AND ABLATION  12-07-2008  &  05-08-2009  DR [name]   SUCCESSFUL ABLATION ATRIAL-FIBRILLATION AND CARDIOVERSION  . CARDIOVERSION  MULTIPLE -----  BETWEEN 2008  to  07-17-2009  . CATARACT EXTRACTION W/ INTRAOCULAR LENS IMPLANT Right   . SLEEP STUDY  2011   NEGATIVE  . TEE WITH CARDIOVERSION  11-27-2007  . TONSILLECTOMY AND ADENOIDECTOMY  AS CHILD  . TRANSTHORACIC ECHOCARDIOGRAM  06-16-2013  DR Grethel Zenk   PT HAS HX HCM/  MODERATE LVH with MILO SAM/  EF 55-60%/  MILD  MR/  SEVERE LAE/  MODERATE RAE  . TRANSURETHRAL RESECTION OF BLADDER TUMOR N/A 02/10/2014   Procedure: TRANSURETHRAL RESECTION OF BLADDER TUMOR (TURBT);  Surgeon: [name], MD;  Location[facility];  Service: Urology;  Laterality: N/A;  . TRANSURETHRAL RESECTION OF BLADDER TUMOR N/A 04/07/2014   Procedure: TRANSURETHRAL  BLADDER BIOPSY;  Surgeon: [name], MD;  Location: [facility];  Service: Urology;  Laterality: N/A;    Current Outpatient Medications  Medication Sig Dispense Refill  . acetaminophen (TYLENOL) 325 MG tablet Take 650 mg by mouth every 6 (six) hours as needed.      . diltiazem (CARDIZEM CD) 360 MG 24 hr capsule TAKE 1 CAPSULE BY MOUTH EVERY DAY 90 capsule 1  . finasteride (PROPECIA) 1 MG tablet Take 1 mg by mouth daily.  11  . furosemide (LASIX) 40 MG tablet TAKE 1 TABLET BY MOUTH TWICE A DAY 180 tablet 1  . KLOR-CON M20 20 MEQ tablet TAKE 1 TABLET BY MOUTH TWICE A DAY 180 tablet 1  . metoprolol succinate (TOPROL-XL) 25 MG 24 hr tablet TAKE 1 TABLET BY MOUTH EVERY DAY 30 tablet 7  . spironolactone (ALDACTONE) 25 MG tablet Take 1 tablet (25 mg total) by mouth daily. 90 tablet 3  . warfarin (COUMADIN) 2 MG tablet TAKE 1 TABLET BY MOUTH AS DIRECTED BY THE COUMADIN CLINIC 30  tablet 3   No current facility-administered medications for this visit.     Allergies:   Patient has no known allergies.   Social History:  The patient  reports that he quit smoking about 19 years ago. His smoking use included cigarettes. He quit after 42.00 years of use. he has never used smokeless tobacco. He reports that he drinks alcohol. He reports that he does not use drugs.   Family History:  The patient's family history includes Colon cancer in his unknown relative.    ROS:  Please see the history of present illness.  Otherwise, review of systems is positive for cough, easy bruising, anxiety.  All other systems are reviewed and negative.    PHYSICAL  EXAM: VS:  BP (!) 122/58   Pulse 78   Ht 5\' 11"  (1.803 m)   Wt 233 lb 1.9 oz (105.7 kg)   SpO2 98%   BMI 32.51 kg/m  , BMI Body mass index is 32.51 kg/m. GEN: Well nourished, well developed, in no acute distress  HEENT: normal  Neck: no JVD, no masses. No carotid bruits Cardiac: irregularly irregular without murmur or gallop                Respiratory:  clear to auscultation bilaterally, normal work of breathing GI: soft, nontender, nondistended, + BS MS: no deformity or atrophy  Ext: 3+ right pretibial edema, 2+ left pretibial edema. Chronic stasis changes.  Skin: stasis changes both legs Neuro:  Strength and sensation are intact Psych: euthymic mood, full affect  EKG:  EKG is ordered today. The ekg ordered today shows atrial fibrillation 78 bpm, low voltage QRS, age-indeterminate septal infarct  Recent Labs: 11/09/2017: BUN 16; Creatinine, Ser 1.22; Potassium 3.9; Sodium 140   Lipid Panel     Component Value Date/Time   CHOL 130 05/22/2015 0906   TRIG 93.0 05/22/2015 0906   HDL 42.10 05/22/2015 0906   CHOLHDL 3 05/22/2015 0906   VLDL 18.6 05/22/2015 0906   LDLCALC 69 05/22/2015 0906      Wt Readings from Last 3 Encounters:  11/09/17 233 lb 1.9 oz (105.7 kg)  04/03/17 232 lb 1.9 oz (105.3 kg)  09/25/16 243 lb 12 oz (110.6 kg)     Cardiac Studies Reviewed: Echo 10-23-2017: Study Conclusions  - Left ventricle: The cavity size was normal. There was severe   concentric hypertrophy. Systolic function was normal. The   estimated ejection fraction was in the range of 60% to 65%. Wall   motion was normal; there were no regional wall motion   abnormalities. The study is not technically sufficient to allow   evaluation of LV diastolic function. - Mitral valve: Mildly thickened leaflets . There was mild   regurgitation. - Left atrium: Severely dilated. - Right atrium: Moderately dilated. - Tricuspid valve: There was mild regurgitation. - Pulmonary arteries: PA peak  pressure: 43 mm Hg (S). - Inferior vena cava: The vessel was normal in size. The   respirophasic diameter changes were in the normal range (>= 50%),   consistent with normal central venous pressure.  Impressions:  - Compared to a prior study in 10/2016, the LVEF is higher at   60-65%, there is severe LAE and moderate RAE, the RVSP is mildly   increased at 43 mmHg.  ASSESSMENT AND PLAN: 1.  Permanent atrial fibrillation. Continue warfarin for anticoagulation, metoprolol succinate and diltiazem for rate control. Recommended increasing Toprol to 50 mg daily for better HR control but he declined. States he's  been missing some doses and would like to stay consistent with it first. He will call if HR remains high with activity.   2. Hypertrophic CM, nonobstructive form: tolerating AV nodal blocking agents without problems. Echo reviewed with no marked LVH or SAM. No high risk clinical findings.   3. HTN: BP controlled on current Rx.   4. Chronic leg swelling. Continues on aldactone and furosemide. Discussed leg elevation at length today.   Current medicines are reviewed with the patient today.  The patient does not have concerns regarding medicines.  Labs/ tests ordered today include:   Orders Placed This Encounter  Procedures  . Basic metabolic panel  . EKG 12-Lead   Disposition:   FU 6 months  Signed, [name], MD  11/11/2017 10:01 PM    [facility] [address], [place], [place]  [address] Phone: [phone]; Fax: [phone]

## 2017-11-09 NOTE — Patient Instructions (Signed)
Medication Instructions:  Your provider recommends that you continue on your current medications as directed. Please refer to the Current Medication list given to you today.    Labwork: TODAY: BMET  Testing/Procedures: None  Follow-Up: Your provider wants you to follow-up in: 6 months with Dr. [name]. You will receive a reminder letter in the mail two months in advance. If you don't receive a letter, please call our office to schedule the follow-up appointment.    Any Other Special Instructions Will Be Listed Below (If Applicable).     If you need a refill on your cardiac medications before your next appointment, please call your pharmacy.

## 2017-11-10 LAB — BASIC METABOLIC PANEL
BUN/Creatinine Ratio: 13 (ref 10–24)
BUN: 16 mg/dL (ref 8–27)
CALCIUM: 9.2 mg/dL (ref 8.6–10.2)
CO2: 27 mmol/L (ref 20–29)
CREATININE: 1.22 mg/dL (ref 0.76–1.27)
Chloride: 98 mmol/L (ref 96–106)
GFR calc Af Amer: 66 mL/min/{1.73_m2} (ref >59)
GFR, EST NON AFRICAN AMERICAN: 57 mL/min/{1.73_m2} — AB (ref >59)
GLUCOSE: 94 mg/dL (ref 65–99)
POTASSIUM: 3.9 mmol/L (ref 3.5–5.2)
SODIUM: 140 mmol/L (ref 134–144)

## 2017-11-11 ENCOUNTER — Encounter: Payer: Self-pay | Admitting: Cardiovascular Disease

## 2017-11-13 ENCOUNTER — Telehealth: Payer: Self-pay | Admitting: Cardiovascular Disease

## 2017-11-13 NOTE — Telephone Encounter (Signed)
F/u message ° °Pt returning RN call  °

## 2017-11-13 NOTE — Telephone Encounter (Signed)
Informed patient of results and verbal understanding expressed.  

## 2017-11-13 NOTE — Telephone Encounter (Signed)
-----   Message from [name], MD sent at 11/12/2017 10:06 PM EST ----- Lab reviewed looks fine

## 2017-11-27 ENCOUNTER — Ambulatory Visit (INDEPENDENT_AMBULATORY_CARE_PROVIDER_SITE_OTHER): Payer: Medicare Other | Admitting: Pharmacist

## 2017-11-27 DIAGNOSIS — Z5181 Encounter for therapeutic drug level monitoring: Secondary | ICD-10-CM

## 2017-11-27 DIAGNOSIS — I4892 Unspecified atrial flutter: Secondary | ICD-10-CM

## 2017-11-27 DIAGNOSIS — I4891 Unspecified atrial fibrillation: Secondary | ICD-10-CM

## 2017-11-27 LAB — POCT INR: INR: 2.5

## 2017-11-27 NOTE — Patient Instructions (Signed)
Description   Continue taking 1/2 tablet daily. Recheck INR in 4 weeks. Coumadin Clinic [phone].

## 2018-02-19 ENCOUNTER — Other Ambulatory Visit: Payer: Self-pay | Admitting: Cardiovascular Disease

## 2018-02-19 DIAGNOSIS — I422 Other hypertrophic cardiomyopathy: Secondary | ICD-10-CM

## 2018-02-19 DIAGNOSIS — I482 Chronic atrial fibrillation, unspecified: Secondary | ICD-10-CM

## 2018-02-25 ENCOUNTER — Ambulatory Visit (INDEPENDENT_AMBULATORY_CARE_PROVIDER_SITE_OTHER): Payer: Medicare Other | Admitting: Pharmacist

## 2018-02-25 DIAGNOSIS — I4891 Unspecified atrial fibrillation: Secondary | ICD-10-CM

## 2018-02-25 DIAGNOSIS — Z5181 Encounter for therapeutic drug level monitoring: Secondary | ICD-10-CM

## 2018-02-25 DIAGNOSIS — I4892 Unspecified atrial flutter: Secondary | ICD-10-CM | POA: Diagnosis not present

## 2018-02-25 LAB — POCT INR: INR: 2.4

## 2018-02-25 NOTE — Patient Instructions (Signed)
Description   Continue taking 1/2 tablet daily. Recheck INR in 6 weeks. Coumadin Clinic [phone].

## 2018-03-22 ENCOUNTER — Other Ambulatory Visit: Payer: Self-pay | Admitting: Cardiovascular Disease

## 2018-03-23 ENCOUNTER — Encounter (HOSPITAL_COMMUNITY): Payer: Self-pay

## 2018-03-23 ENCOUNTER — Emergency Department (HOSPITAL_COMMUNITY)
Admission: EM | Admit: 2018-03-23 | Discharge: 2018-03-24 | Disposition: A | Discharge status: Home / Self Care | Payer: Medicare Other | Attending: Emergency Medicine | Admitting: Emergency Medicine

## 2018-03-23 DIAGNOSIS — L03115 Cellulitis of right lower limb: Secondary | ICD-10-CM | POA: Diagnosis not present

## 2018-03-23 DIAGNOSIS — I10 Essential (primary) hypertension: Secondary | ICD-10-CM | POA: Diagnosis not present

## 2018-03-23 DIAGNOSIS — R600 Localized edema: Secondary | ICD-10-CM | POA: Diagnosis present

## 2018-03-23 DIAGNOSIS — I872 Venous insufficiency (chronic) (peripheral): Secondary | ICD-10-CM

## 2018-03-23 DIAGNOSIS — Z87891 Personal history of nicotine dependence: Secondary | ICD-10-CM | POA: Diagnosis not present

## 2018-03-23 DIAGNOSIS — Z79899 Other long term (current) drug therapy: Secondary | ICD-10-CM | POA: Insufficient documentation

## 2018-03-23 DIAGNOSIS — Z7901 Long term (current) use of anticoagulants: Secondary | ICD-10-CM | POA: Insufficient documentation

## 2018-03-23 MED ORDER — LEVOFLOXACIN 750 MG PO TABS: 750.0000 mg | ORAL_TABLET | Freq: Every day | ORAL | 0 refills | Status: DC

## 2018-03-23 MED ORDER — LEVOFLOXACIN 750 MG PO TABS
750.0000 mg | ORAL_TABLET | Freq: Once | ORAL | Status: AC
  Administered 2018-03-23: 750 mg via ORAL
  Filled 2018-03-23: qty 1

## 2018-03-23 NOTE — ED Provider Notes (Addendum)
[facility] DEPT Provider Note: Bradley Spurling, MD, FACEP  CSN: [id number] MRN: [id number] ARRIVAL: 03/23/18 at 2117 ROOM: [place]  Cellulitis   HISTORY OF PRESENT ILLNESS  03/23/18 10:58 PM Bradley Winters is a 77 y.o. male with chronic venous insufficiency of the lower extremities and a chronic cellulitis of the right lower leg.  He has been treated with outpatient courses of amoxicillin, Augmentin and Keflex without resolution of his presumed cellulitis.  His symptoms worsened over the past 2 days and he was sent by his primary care physician for evaluation in the ED.  Although he is already on Coumadin there is a concern about a DVT in his right leg due to edema.  He rates the pain in his right lower leg is a 3 out of 10, worse with palpation or weightbearing.  He is not aware of having a fever.  There has been some oozing from the right lower leg.   Past Medical History:  Diagnosis Date  . Anxiety    MOSTLY AT NIGHT  . Bladder cancer ([place])   . ED (erectile dysfunction)   . History of colonoscopy with polypectomy    BENIGN  . History of gastric ulcer   . History of seizures as a child    FEBRILE  . Hypertension   . Hypertrophic cardiomyopathy ([place])    HEREDITARY--  POSITIVE for MYBPC3  GENE (HETEROZYGOUS)  . Persistent atrial fibrillation ([place])    CARDIOLOGIST-- DR [name]  AND DR [name]  . PONV (postoperative nausea and vomiting)   . Thrombocytopenia ([place]) PT ASYMPTOMATIC   MILD--  HEMOTOLOGIST--  DR [name]  . Venous insufficiency   . Wears glasses     Past Surgical History:  Procedure Laterality Date  . CARDIAC ELECTROPHYSIOLOGY MAPPING AND ABLATION  12-07-2008  &  05-08-2009  DR [name]   SUCCESSFUL ABLATION ATRIAL-FIBRILLATION AND CARDIOVERSION  . CARDIOVERSION  MULTIPLE -----  BETWEEN 2008  to  07-17-2009  . CATARACT EXTRACTION W/ INTRAOCULAR LENS IMPLANT Right   . SLEEP STUDY  2011   NEGATIVE  . TEE WITH CARDIOVERSION  11-27-2007  .  TONSILLECTOMY AND ADENOIDECTOMY  AS CHILD  . TRANSTHORACIC ECHOCARDIOGRAM  06-16-2013  DR COOPER   PT HAS HX HCM/  MODERATE LVH with MILO SAM/  EF 55-60%/  MILD MR/  SEVERE LAE/  MODERATE RAE  . TRANSURETHRAL RESECTION OF BLADDER TUMOR N/A 02/10/2014   Procedure: TRANSURETHRAL RESECTION OF BLADDER TUMOR (TURBT);  Surgeon: [name], MD;  Location[facility];  Service: Urology;  Laterality: N/A;  . TRANSURETHRAL RESECTION OF BLADDER TUMOR N/A 04/07/2014   Procedure: TRANSURETHRAL  BLADDER BIOPSY;  Surgeon: [name], MD;  Location: [facility];  Service: Urology;  Laterality: N/A;    Family History  Problem Relation Age of Onset  . Colon cancer Unknown     Social History   Tobacco Use  . Smoking status: Former Smoker    Years: 42.00    Types: Cigarettes    Last attempt to quit: 10/27/1998    Years since quitting: 19.4  . Smokeless tobacco: Never Used  Substance Use Topics  . Alcohol use: Yes    Comment: VERY RARE  . Drug use: No    Prior to Admission medications   Medication Sig Start Date End Date Taking? Authorizing Provider  acetaminophen (TYLENOL) 325 MG tablet Take 325 mg by mouth every 6 (six) hours as needed for moderate pain.  Yes [provider]  cetirizine (ZYRTEC) 10 MG tablet Take 10 mg by mouth daily.   Yes [provider]  diltiazem (CARDIZEM CD) 360 MG 24 hr capsule TAKE 1 CAPSULE BY MOUTH EVERY DAY 03/23/18  Yes [name], MD  finasteride (PROPECIA) 1 MG tablet Take 1 mg by mouth daily. 08/25/16  Yes [provider]  furosemide (LASIX) 40 MG tablet TAKE 1 TABLET BY MOUTH TWICE A DAY 09/25/17  Yes [name], MD  KLOR-CON M20 20 MEQ tablet TAKE 1 TABLET BY MOUTH TWICE A DAY 02/19/18  Yes [name], MD  metoprolol succinate (TOPROL-XL) 25 MG 24 hr tablet TAKE 1 TABLET BY MOUTH EVERY DAY 08/20/17  Yes [name], MD  spironolactone (ALDACTONE) 25 MG tablet Take 25 mg by mouth  daily. 02/09/18  Yes [provider]  warfarin (COUMADIN) 2 MG tablet TAKE 1 TABLET BY MOUTH AS DIRECTED BY THE COUMADIN CLINIC Patient taking differently: TAKE 0.5 TABLET (1 MG) BY MOUTH DAILY  DIRECTED BY THE COUMADIN CLINIC 04/27/17  Yes [name], MD  spironolactone (ALDACTONE) 25 MG tablet Take 1 tablet (25 mg total) by mouth daily. 04/15/17 11/09/17  [name], MD    Allergies Patient has no known allergies.   REVIEW OF SYSTEMS  Negative except as noted here or in the History of Present Illness.   PHYSICAL EXAMINATION  Initial Vital Signs Blood pressure 132/86, pulse 86, temperature 98.2 F (36.8 C), temperature source Oral, resp. rate 15, height 5\' 11"  (1.803 m), weight 108 kg (238 lb), SpO2 94 %.  Examination General: Well-developed, well-nourished male in no acute distress; appearance consistent with age of record HENT: normocephalic; atraumatic Eyes: pupils equal, round and reactive to light; extraocular muscles intact; bilateral pseudophakia Neck: supple Heart: regular rate and rhythm Lungs: clear to auscultation bilaterally Abdomen: soft; nondistended; nontender; bowel sounds present Extremities: No deformity; full range of motion; pulses normal; edema of lower extremities, right greater than left; chronic appearing stasis changes of the lower legs, right greater than left with warmth and violaceous skin of the right anterior lower leg:       Neurologic: Awake, alert and oriented; motor function intact in all extremities and symmetric; no facial droop Skin: Warm and dry; lower extremity changes as noted above Psychiatric: Normal mood and affect   RESULTS  Summary of this visit's results, reviewed by myself:   EKG Interpretation  Date/Time:    Ventricular Rate:    PR Interval:    QRS Duration:   QT Interval:    QTC Calculation:   R Axis:     Text Interpretation:        Laboratory Studies: No results found for this or any previous visit  (from the past 24 hour(s)). Imaging Studies: No results found.  ED COURSE and MDM  Nursing notes and initial vitals signs, including pulse oximetry, reviewed.  Vitals:   03/23/18 2204  BP: 132/86  Pulse: 86  Resp: 15  Temp: 98.2 F (36.8 C)  TempSrc: Oral  SpO2: 94%  Weight: 108 kg (238 lb)  Height: 5\' 11"  (1.803 m)   I do not believe that inpatient treatment is necessary at this time and the patient does not wish to be admitted.  We will start him on levofloxacin and have him follow-up for a Doppler ultrasound tomorrow.  Patient was advised that Levaquin is not a first-line drug but will be tried given his failure on other antimicrobials.  He was advised of the possibility of  side effects such as connective tissue inflammation or diarrhea and the need to be reevaluated if these occur.  He was also advised that this may alter the effectiveness of his Coumadin and will need to be followed up for this as well.  PROCEDURES    ED DIAGNOSES     ICD-10-CM   1. Cellulitis of right leg L03.115   2. Venous stasis dermatitis of both lower extremities I87.2   3. Bilateral lower extremity edema R60.0        Vickki Igou, MD 03/23/18 2320    Shanon Rosser, MD 03/23/18 2325

## 2018-03-24 ENCOUNTER — Ambulatory Visit (HOSPITAL_BASED_OUTPATIENT_CLINIC_OR_DEPARTMENT_OTHER)
Admission: RE | Admit: 2018-03-24 | Discharge: 2018-03-24 | Disposition: A | Discharge status: Home / Self Care | Payer: Medicare Other | Source: Ambulatory Visit | Attending: Emergency Medicine | Admitting: Emergency Medicine

## 2018-03-24 DIAGNOSIS — M7989 Other specified soft tissue disorders: Secondary | ICD-10-CM

## 2018-03-24 DIAGNOSIS — M79609 Pain in unspecified limb: Secondary | ICD-10-CM

## 2018-03-24 DIAGNOSIS — L03115 Cellulitis of right lower limb: Secondary | ICD-10-CM | POA: Diagnosis not present

## 2018-03-24 DIAGNOSIS — M79661 Pain in right lower leg: Secondary | ICD-10-CM

## 2018-03-24 NOTE — Progress Notes (Signed)
Preliminary results by tech - Right Lower Ext. Venous Completed. Negative for deep vein thrombosis. Oda Cogan, BS, RDMS, RVT

## 2018-03-27 ENCOUNTER — Other Ambulatory Visit: Payer: Self-pay | Admitting: Cardiovascular Disease

## 2018-03-29 ENCOUNTER — Telehealth: Payer: Self-pay | Admitting: *Deleted

## 2018-03-29 NOTE — Telephone Encounter (Signed)
Pt has been on Levaquin 750mg  daily for 9 days and this was started on May 28th for cellulitis right leg Left message on pt's mobile phone and his wife's mobile phone as there is interaction between [place] and coumadin and he needs to have INR checked today if possible

## 2018-03-29 NOTE — Telephone Encounter (Signed)
Have called home and mobile numbers for pt and his wife and had to leave message to call coumadin clinic to have INR checked as he is on Levaquin 750mg  daily since May 28th

## 2018-03-29 NOTE — Telephone Encounter (Signed)
Have called both mobile and home phone numbers for pt and his wife and left message to call coumadin clinic regarding interaction between levaquin and coumadin

## 2018-03-29 NOTE — Telephone Encounter (Signed)
Left message on Mobile phone of pt and left message on Home phone as well to call regarding interaction between coumadin and levaquin

## 2018-03-31 ENCOUNTER — Telehealth: Payer: Self-pay | Admitting: *Deleted

## 2018-03-31 NOTE — Telephone Encounter (Signed)
Again called pt and pt's wife on home and mobile phones and left message to return call to coumadin clinic regarding interaction between coumadin and Levaquin

## 2018-04-01 ENCOUNTER — Ambulatory Visit (INDEPENDENT_AMBULATORY_CARE_PROVIDER_SITE_OTHER): Payer: Medicare Other | Admitting: *Deleted

## 2018-04-01 ENCOUNTER — Telehealth: Payer: Self-pay | Admitting: *Deleted

## 2018-04-01 DIAGNOSIS — I4892 Unspecified atrial flutter: Secondary | ICD-10-CM | POA: Diagnosis not present

## 2018-04-01 DIAGNOSIS — I4891 Unspecified atrial fibrillation: Secondary | ICD-10-CM

## 2018-04-01 DIAGNOSIS — Z5181 Encounter for therapeutic drug level monitoring: Secondary | ICD-10-CM

## 2018-04-01 LAB — POCT INR: INR: 3.2 — AB (ref 2.0–3.0)

## 2018-04-01 NOTE — Patient Instructions (Signed)
Description   Do not take coumadin tomorrow June 7th as he has taken today then continue taking 1/2 tablet daily. Recheck INR in 2 weeks. Coumadin Clinic [phone].Please call if ordered any new medication or if scheduled for any procedure

## 2018-04-01 NOTE — Telephone Encounter (Signed)
Was able to speak with pt's wife at work and inform her of interaction between levaquin and Coumadin and that he needs to have INR checked Mrs Hageman states they are having problem with their voice mail and she had not received any of the messages Appt made for pt to be seen today and she states she will get in touch with him to keep this appt

## 2018-04-15 ENCOUNTER — Ambulatory Visit (INDEPENDENT_AMBULATORY_CARE_PROVIDER_SITE_OTHER): Payer: Medicare Other | Admitting: Pharmacist

## 2018-04-15 DIAGNOSIS — Z5181 Encounter for therapeutic drug level monitoring: Secondary | ICD-10-CM | POA: Diagnosis not present

## 2018-04-15 DIAGNOSIS — I4892 Unspecified atrial flutter: Secondary | ICD-10-CM | POA: Diagnosis not present

## 2018-04-15 DIAGNOSIS — I4891 Unspecified atrial fibrillation: Secondary | ICD-10-CM | POA: Diagnosis not present

## 2018-04-15 LAB — POCT INR: INR: 2.2 (ref 2.0–3.0)

## 2018-04-15 NOTE — Patient Instructions (Signed)
Description   Continue taking 1/2 tablet daily. Recheck INR in 5 weeks. Coumadin Clinic [phone].Please call if ordered any new medication or if scheduled for any procedure

## 2018-04-28 ENCOUNTER — Telehealth: Payer: Self-pay | Admitting: *Deleted

## 2018-04-28 ENCOUNTER — Telehealth: Payer: Self-pay | Admitting: Pharmacist

## 2018-04-28 NOTE — Telephone Encounter (Signed)
Pt left message on VM that started levaquin. LMOM to speak to patient.

## 2018-04-28 NOTE — Telephone Encounter (Signed)
See other phone note from 04/28/18

## 2018-04-28 NOTE — Telephone Encounter (Signed)
Pt called stating he is starting Levaquin 750 mg daily today for 10 days for cellulitis Pt instructed there is an interaction between Coumadin and Levaquin and instructed to take coumadin as ordered and Levaquin as ordered and made an appt for him to have INR checked on Monday July 8th and instructed to eat serving of greens Saturday and Sunday and he states understanding

## 2018-05-03 ENCOUNTER — Ambulatory Visit (INDEPENDENT_AMBULATORY_CARE_PROVIDER_SITE_OTHER): Payer: Medicare Other | Admitting: *Deleted

## 2018-05-03 DIAGNOSIS — Z5181 Encounter for therapeutic drug level monitoring: Secondary | ICD-10-CM | POA: Diagnosis not present

## 2018-05-03 DIAGNOSIS — I4892 Unspecified atrial flutter: Secondary | ICD-10-CM | POA: Diagnosis not present

## 2018-05-03 DIAGNOSIS — I4891 Unspecified atrial fibrillation: Secondary | ICD-10-CM

## 2018-05-03 LAB — POCT INR: INR: 2.8 (ref 2.0–3.0)

## 2018-05-03 NOTE — Patient Instructions (Signed)
Description   Continue taking 1/2 tablet daily. Recheck INR in 6 weeks. Coumadin Clinic [phone].Please call if ordered any new medication or if scheduled for any procedure

## 2018-05-14 ENCOUNTER — Other Ambulatory Visit: Payer: Self-pay | Admitting: Cardiovascular Disease

## 2018-05-17 ENCOUNTER — Other Ambulatory Visit: Payer: Self-pay | Admitting: Cardiovascular Disease

## 2018-05-17 DIAGNOSIS — I421 Obstructive hypertrophic cardiomyopathy: Secondary | ICD-10-CM

## 2018-05-17 DIAGNOSIS — I4891 Unspecified atrial fibrillation: Secondary | ICD-10-CM

## 2018-06-14 ENCOUNTER — Ambulatory Visit (INDEPENDENT_AMBULATORY_CARE_PROVIDER_SITE_OTHER): Payer: Medicare Other | Admitting: Pharmacist

## 2018-06-14 DIAGNOSIS — I4892 Unspecified atrial flutter: Secondary | ICD-10-CM

## 2018-06-14 DIAGNOSIS — Z5181 Encounter for therapeutic drug level monitoring: Secondary | ICD-10-CM

## 2018-06-14 DIAGNOSIS — I4891 Unspecified atrial fibrillation: Secondary | ICD-10-CM

## 2018-06-14 LAB — POCT INR: INR: 2.4 (ref 2.0–3.0)

## 2018-06-14 NOTE — Patient Instructions (Signed)
Description   Continue taking 1/2 tablet daily. Recheck INR in 6 weeks. Coumadin Clinic [phone].Please call if ordered any new medication or if scheduled for any procedure

## 2018-06-30 ENCOUNTER — Encounter: Payer: Self-pay | Admitting: Cardiovascular Disease

## 2018-07-15 ENCOUNTER — Ambulatory Visit (INDEPENDENT_AMBULATORY_CARE_PROVIDER_SITE_OTHER): Payer: Medicare Other | Admitting: Cardiovascular Disease

## 2018-07-15 ENCOUNTER — Encounter: Payer: Self-pay | Admitting: Cardiovascular Disease

## 2018-07-15 VITALS — BP 120/70 | HR 75 | Ht 71.0 in | Wt 240.8 lb

## 2018-07-15 DIAGNOSIS — I4821 Permanent atrial fibrillation: Secondary | ICD-10-CM

## 2018-07-15 DIAGNOSIS — I482 Chronic atrial fibrillation: Secondary | ICD-10-CM

## 2018-07-15 DIAGNOSIS — I872 Venous insufficiency (chronic) (peripheral): Secondary | ICD-10-CM | POA: Diagnosis not present

## 2018-07-15 DIAGNOSIS — I5032 Chronic diastolic (congestive) heart failure: Secondary | ICD-10-CM | POA: Diagnosis not present

## 2018-07-15 NOTE — Patient Instructions (Signed)

## 2018-07-15 NOTE — Progress Notes (Signed)
Cardiology Office Note:    Date:  07/17/2018   ID:  Bradley Winters, DOB Sep 13, 1941, MRN [id number]  PCP:  System, Provider Not In  Cardiologist:  [name], MD  Electrophysiologist:  None   Referring MD: No ref. provider found   Chief Complaint  Patient presents with  . Atrial Fibrillation    History of Present Illness:    Bradley Winters is a 77 y.o. male with a hx of HCM and atrial fibrillation, presenting for follow-up evaluation.   The patient is doing ok. He reports no symptoms of chest pain or shortness of breath. No orthopnea, PND, or palpitations. He continues to have problems with chronic leg swelling secondary to severe venous insufficiency. He's been seen in the ER with cellulitis.   Past Medical History:  Diagnosis Date  . Anxiety    MOSTLY AT NIGHT  . Bladder cancer ([place])   . ED (erectile dysfunction)   . History of colonoscopy with polypectomy    BENIGN  . History of gastric ulcer   . History of seizures as a child    FEBRILE  . Hypertension   . Hypertrophic cardiomyopathy ([place])    HEREDITARY--  POSITIVE for MYBPC3  GENE (HETEROZYGOUS)  . Persistent atrial fibrillation ([place])    CARDIOLOGIST-- DR [name]  AND DR [name]  . PONV (postoperative nausea and vomiting)   . Thrombocytopenia ([place]) PT ASYMPTOMATIC   MILD--  HEMOTOLOGIST--  DR [name]  . Venous insufficiency   . Wears glasses     Past Surgical History:  Procedure Laterality Date  . CARDIAC ELECTROPHYSIOLOGY MAPPING AND ABLATION  12-07-2008  &  05-08-2009  DR [name]   SUCCESSFUL ABLATION ATRIAL-FIBRILLATION AND CARDIOVERSION  . CARDIOVERSION  MULTIPLE -----  BETWEEN 2008  to  07-17-2009  . CATARACT EXTRACTION W/ INTRAOCULAR LENS IMPLANT Right   . SLEEP STUDY  2011   NEGATIVE  . TEE WITH CARDIOVERSION  11-27-2007  . TONSILLECTOMY AND ADENOIDECTOMY  AS CHILD  . TRANSTHORACIC ECHOCARDIOGRAM  06-16-2013  DR Tryton Bodi   PT HAS HX HCM/  MODERATE LVH with MILO SAM/  EF 55-60%/  MILD MR/   SEVERE LAE/  MODERATE RAE  . TRANSURETHRAL RESECTION OF BLADDER TUMOR N/A 02/10/2014   Procedure: TRANSURETHRAL RESECTION OF BLADDER TUMOR (TURBT);  Surgeon: [name], MD;  Location: [facility];  Service: Urology;  Laterality: N/A;  . TRANSURETHRAL RESECTION OF BLADDER TUMOR N/A 04/07/2014   Procedure: TRANSURETHRAL  BLADDER BIOPSY;  Surgeon: [name], MD;  Location: [facility];  Service: Urology;  Laterality: N/A;    Current Medications: Current Meds  Medication Sig  . acetaminophen (TYLENOL) 325 MG tablet Take 325 mg by mouth every 6 (six) hours as needed for moderate pain.   . cetirizine (ZYRTEC) 10 MG tablet Take 10 mg by mouth daily.  [name] diltiazem (CARDIZEM CD) 360 MG 24 hr capsule TAKE 1 CAPSULE BY MOUTH EVERY DAY  . finasteride (PROPECIA) 1 MG tablet Take 1 mg by mouth daily.  . furosemide (LASIX) 40 MG tablet TAKE 1 TABLET BY MOUTH TWICE A DAY  . KLOR-CON M20 20 MEQ tablet TAKE 1 TABLET BY MOUTH TWICE A DAY  . metoprolol succinate (TOPROL-XL) 25 MG 24 hr tablet TAKE 1 TABLET BY MOUTH EVERY DAY  . spironolactone (ALDACTONE) 25 MG tablet TAKE 1 TABLET BY MOUTH EVERY DAY  . warfarin (COUMADIN) 2 MG tablet Take as directed by coumadin clinic     Allergies:   Patient  has no known allergies.   Social History   Socioeconomic History  . Marital status: Married    Spouse name: Not on file  . Number of children: Not on file  . Years of education: Not on file  . Highest education level: Not on file  Occupational History  . Occupation: retired  Scientific laboratory technician  . Financial resource strain: Not on file  . Food insecurity:    Worry: Not on file    Inability: Not on file  . Transportation needs:    Medical: Not on file    Non-medical: Not on file  Tobacco Use  . Smoking status: Former Smoker    Years: 42.00    Types: Cigarettes    Last attempt to quit: 10/27/1998    Years since quitting: 19.7  . Smokeless tobacco: Never Used  Substance and  Sexual Activity  . Alcohol use: Yes    Comment: VERY RARE  . Drug use: No  . Sexual activity: Not on file  Lifestyle  . Physical activity:    Days per week: Not on file    Minutes per session: Not on file  . Stress: Not on file  Relationships  . Social connections:    Talks on phone: Not on file    Gets together: Not on file    Attends religious service: Not on file    Active member of club or organization: Not on file    Attends meetings of clubs or organizations: Not on file    Relationship status: Not on file  Other Topics Concern  . Not on file  Social History Narrative  . Not on file     Family History: The patient's family history includes Colon cancer in his unknown relative.  ROS:   Please see the history of present illness.    Positive for leg swelling, cough, easy bruising, leg pain, snoring, wheezing. All other systems reviewed and are negative.  EKGs/Labs/Other Studies Reviewed:    The following studies were reviewed today: Echo 10-23-2017: Study Conclusions  - Left ventricle: The cavity size was normal. There was severe   concentric hypertrophy. Systolic function was normal. The   estimated ejection fraction was in the range of 60% to 65%. Wall   motion was normal; there were no regional wall motion   abnormalities. The study is not technically sufficient to allow   evaluation of LV diastolic function. - Mitral valve: Mildly thickened leaflets . There was mild   regurgitation. - Left atrium: Severely dilated. - Right atrium: Moderately dilated. - Tricuspid valve: There was mild regurgitation. - Pulmonary arteries: PA peak pressure: 43 mm Hg (S). - Inferior vena cava: The vessel was normal in size. The   respirophasic diameter changes were in the normal range (>= 50%),   consistent with normal central venous pressure.  Impressions:  - Compared to a prior study in 10/2016, the LVEF is higher at   60-65%, there is severe LAE and moderate RAE, the  RVSP is mildly   increased at 43 mmHg.  EKG:  EKG is not ordered today.   Recent Labs: 11/09/2017: BUN 16; Creatinine, Ser 1.22; Potassium 3.9; Sodium 140  Recent Lipid Panel    Component Value Date/Time   CHOL 130 05/22/2015 0906   TRIG 93.0 05/22/2015 0906   HDL 42.10 05/22/2015 0906   CHOLHDL 3 05/22/2015 0906   VLDL 18.6 05/22/2015 0906   LDLCALC 69 05/22/2015 0906    Physical Exam:    VS:  BP 120/70   Pulse 75   Ht 5\' 11"  (1.803 m)   Wt 240 lb 12.8 oz (109.2 kg)   SpO2 96%   BMI 33.58 kg/m     Wt Readings from Last 3 Encounters:  07/15/18 240 lb 12.8 oz (109.2 kg)  03/23/18 238 lb (108 kg)  11/09/17 233 lb 1.9 oz (105.7 kg)     GEN: Well nourished, well developed in no acute distress HEENT: Normal NECK: No JVD; No carotid bruits LYMPHATICS: No lymphadenopathy CARDIAC: irregularly irregular, 2/6 systolic murmur at the LSB RESPIRATORY:  Clear to auscultation without rales, wheezing or rhonchi  ABDOMEN: Soft, non-tender, non-distended MUSCULOSKELETAL:  Marked bilateral leg edema; No deformity  SKIN: Warm and dry, chronic stasis dermatitis noted NEUROLOGIC:  Alert and oriented x 3 PSYCHIATRIC:  Normal affect   ASSESSMENT:    1. Permanent atrial fibrillation ([place])   2. Chronic diastolic heart failure (HCC)   3. Venous stasis dermatitis of both lower extremities    PLAN:    In order of problems listed above:  1. Stable with good rate control, tolerating anticoagulation. Continue diltiazem, metoprolol, and warfarin anticoagulation. 2. Volume status looks good on exam. Leg swelling related to severe venous stasis. Continue aldactone, furosemide. Most recent echo reviewed. 3. We have discussed conservative treatment options. He has seen Dr Donnetta Hutching in the past. I am going to refer him back to see if any treatment options are available for him.    Medication Adjustments/Labs and Tests Ordered: Current medicines are reviewed at length with the patient today.   Concerns regarding medicines are outlined above.  No orders of the defined types were placed in this encounter.  No orders of the defined types were placed in this encounter.   Patient Instructions  Medication Instructions:  Your provider recommends that you continue on your current medications as directed. Please refer to the Current Medication list given to you today.    Labwork: None  Testing/Procedures: None  Follow-Up: Your provider wants you to follow-up in: 6 months with Dr. [name]. You will receive a reminder letter in the mail two months in advance. If you don't receive a letter, please call our office to schedule the follow-up appointment.    Any Other Special Instructions Will Be Listed Below (If Applicable).     If you need a refill on your cardiac medications before your next appointment, please call your pharmacy.      Signed, [name], MD  07/17/2018 8:56 AM    [place]

## 2018-07-26 ENCOUNTER — Encounter

## 2018-07-26 ENCOUNTER — Ambulatory Visit (INDEPENDENT_AMBULATORY_CARE_PROVIDER_SITE_OTHER): Payer: Medicare Other

## 2018-07-26 DIAGNOSIS — I4891 Unspecified atrial fibrillation: Secondary | ICD-10-CM

## 2018-07-26 DIAGNOSIS — I4892 Unspecified atrial flutter: Secondary | ICD-10-CM

## 2018-07-26 DIAGNOSIS — Z5181 Encounter for therapeutic drug level monitoring: Secondary | ICD-10-CM | POA: Diagnosis not present

## 2018-07-26 LAB — POCT INR: INR: 2.4 (ref 2.0–3.0)

## 2018-07-26 NOTE — Patient Instructions (Signed)
Description   Continue on same dosage 1/2 tablet daily. Recheck INR in 6 weeks. Coumadin Clinic [phone].Please call if ordered any new medication or if scheduled for any procedure

## 2018-07-30 ENCOUNTER — Telehealth: Payer: Self-pay

## 2018-07-30 DIAGNOSIS — I4892 Unspecified atrial flutter: Secondary | ICD-10-CM

## 2018-07-30 DIAGNOSIS — I872 Venous insufficiency (chronic) (peripheral): Secondary | ICD-10-CM

## 2018-07-30 NOTE — Telephone Encounter (Signed)
Per Dr. [name], referral placed for Dr. Donnetta Hutching for PVD. Scheduled patient for  6 mo visit with Dr. [name] in March 2020. Patient was grateful for call and agrees with treatment plan.

## 2018-07-30 NOTE — Telephone Encounter (Signed)
-----   Message from [name], MD sent at 07/17/2018  8:59 AM EDT ----- [name] - this is a patient who you haven't seen since 2011 and he remebers you telling him there's not much else to be offered about his venous stasis. He's had a lot of trouble with progressive edema and recurrent cellulitis - wonder if it's worth another evaluation? There's a picture of his legs in the recent ER note. If you think worthwhile I will refer him back to you. thx Ronalee Belts

## 2018-08-13 NOTE — Telephone Encounter (Signed)
The patient has been scheduled with Dr. Donnetta Hutching 12/10.

## 2018-08-14 ENCOUNTER — Other Ambulatory Visit: Payer: Self-pay | Admitting: Cardiovascular Disease

## 2018-08-14 DIAGNOSIS — I482 Chronic atrial fibrillation, unspecified: Secondary | ICD-10-CM

## 2018-08-14 DIAGNOSIS — I422 Other hypertrophic cardiomyopathy: Secondary | ICD-10-CM

## 2018-08-25 ENCOUNTER — Telehealth: Payer: Self-pay

## 2018-08-25 NOTE — Telephone Encounter (Signed)
   [name] Pre-operative Risk Assessment    Request for surgical clearance:  1. What type of surgery is being performed? colonoscopy   2. When is this surgery scheduled? 10/06/18   3. What type of clearance is required (medical clearance vs. Pharmacy clearance to hold med vs. Both)? Both  4. Are there any medications that need to be held prior to surgery and how long? Coumadin 5 days   5. Practice name and name of physician performing surgery? [facility]/ Dr Paulita Fujita   6. What is your office phone number [phone]    7.   What is your office fax number [phone]  8.   Anesthesia type (None, local, MAC, general) ? Bradley Winters  Bradley Winters 08/25/2018, 3:50 PM  _________________________________________________________________   (provider comments below)

## 2018-08-27 NOTE — Telephone Encounter (Signed)
Patient with diagnosis of atrial fibrillation on warfarin for anticoagulation.    Procedure: colonoscopy Date of procedure: 10/06/18  CHADS2-VASc score of  4 (CHF, HTN, AGE, , AGE,)  CrCl 78.3 Platelet count 150  Per office protocol, patient can hold warfarin for 5 days prior to procedure.    Patient will not need bridging with Lovenox (enoxaparin) around procedure.

## 2018-08-27 NOTE — Telephone Encounter (Signed)
Given past medical history and time since last visit, based on ACC/AHA guidelines, Bradley Winters would be at acceptable risk for the planned procedure without further cardiovascular testing. Getting > 4 mets for activity.   I will route this recommendation to the requesting party via Epic fax function and remove from pre-op pool.  Please call with questions.  [place], [place] 08/27/2018, 11:21 AM

## 2018-09-03 NOTE — Telephone Encounter (Signed)
Re-faxed surgical clearance over.

## 2018-09-03 NOTE — Telephone Encounter (Signed)
Follow up   Per Greta Doom  states that the fax was not received. Please fax this [phone].

## 2018-09-03 NOTE — Telephone Encounter (Signed)
Please assist. Melina Copa PA-C

## 2018-09-07 ENCOUNTER — Ambulatory Visit (INDEPENDENT_AMBULATORY_CARE_PROVIDER_SITE_OTHER): Payer: Medicare Other | Admitting: *Deleted

## 2018-09-07 DIAGNOSIS — Z5181 Encounter for therapeutic drug level monitoring: Secondary | ICD-10-CM | POA: Diagnosis not present

## 2018-09-07 DIAGNOSIS — I4892 Unspecified atrial flutter: Secondary | ICD-10-CM

## 2018-09-07 DIAGNOSIS — I4891 Unspecified atrial fibrillation: Secondary | ICD-10-CM | POA: Diagnosis not present

## 2018-09-07 LAB — POCT INR: INR: 2.5 (ref 2.0–3.0)

## 2018-09-07 NOTE — Patient Instructions (Addendum)
Description   Continue on same dosage 1/2 tablet daily. Please follow instructions for holding Coumadin Recheck INR 1 week after procedure. Coumadin Clinic [phone]. Please call if ordered any new medication or if scheduled for any procedure.      Take your last dose of Coumadin on 09/30/18 and start holding Coumadin for procedure on 10/06/18.  You will resume dose once MD states it is ok to do so and once you resume procedure you will take an extra 1/2 tablet for two days then resume normal dose.

## 2018-09-26 ENCOUNTER — Other Ambulatory Visit: Payer: Self-pay | Admitting: Cardiovascular Disease

## 2018-10-05 ENCOUNTER — Encounter (HOSPITAL_COMMUNITY): Payer: Medicare Other

## 2018-10-05 ENCOUNTER — Encounter: Payer: Medicare Other | Admitting: Vascular Surgery

## 2018-11-10 ENCOUNTER — Other Ambulatory Visit: Payer: Self-pay

## 2018-11-10 DIAGNOSIS — I872 Venous insufficiency (chronic) (peripheral): Secondary | ICD-10-CM

## 2018-11-16 ENCOUNTER — Ambulatory Visit (HOSPITAL_COMMUNITY)
Admission: RE | Admit: 2018-11-16 | Discharge: 2018-11-16 | Disposition: A | Discharge status: Home / Self Care | Payer: Medicare Other | Source: Ambulatory Visit | Attending: Vascular Surgery | Admitting: Vascular Surgery

## 2018-11-16 ENCOUNTER — Other Ambulatory Visit: Payer: Self-pay

## 2018-11-16 ENCOUNTER — Other Ambulatory Visit: Payer: Self-pay | Admitting: Cardiovascular Disease

## 2018-11-16 ENCOUNTER — Ambulatory Visit (INDEPENDENT_AMBULATORY_CARE_PROVIDER_SITE_OTHER): Payer: Medicare Other | Admitting: Vascular Surgery

## 2018-11-16 ENCOUNTER — Encounter: Payer: Self-pay | Admitting: Vascular Surgery

## 2018-11-16 VITALS — BP 122/74 | HR 66 | Temp 97.0°F | Resp 20 | Ht 71.0 in | Wt 230.0 lb

## 2018-11-16 DIAGNOSIS — Q820 Hereditary lymphedema: Secondary | ICD-10-CM

## 2018-11-16 DIAGNOSIS — I421 Obstructive hypertrophic cardiomyopathy: Secondary | ICD-10-CM

## 2018-11-16 DIAGNOSIS — I4891 Unspecified atrial fibrillation: Secondary | ICD-10-CM

## 2018-11-16 DIAGNOSIS — I872 Venous insufficiency (chronic) (peripheral): Secondary | ICD-10-CM

## 2018-11-16 NOTE — Progress Notes (Signed)
[facility] [place]  Patient name: Bradley Winters MRN: [id number] DOB: 24-Sep-1941 Sex: male  REASON FOR CONSULT: Evaluation of bilateral lower extremity swelling  HPI: Bradley Winters is a 78 y.o. male, who is known to me from a remote history of staged bilateral great saphenous vein ablation for venous hypertension.  I have not seen him in over 10 years.  He is seen today for evaluation of progressive lower extremity swelling.  He has not had any ulceration.  He is also undergone repeat venous duplex today as well.  He does have history of congestive heart failure with hypertrophic cardiomyopathy.  No history of DVT  Past Medical History:  Diagnosis Date  . Anxiety    MOSTLY AT NIGHT  . Bladder cancer ([place])   . ED (erectile dysfunction)   . History of colonoscopy with polypectomy    BENIGN  . History of gastric ulcer   . History of seizures as a child    FEBRILE  . Hypertension   . Hypertrophic cardiomyopathy ([place])    HEREDITARY--  POSITIVE for MYBPC3  GENE (HETEROZYGOUS)  . Persistent atrial fibrillation    CARDIOLOGIST-- DR [name]  AND DR [name]  . PONV (postoperative nausea and vomiting)   . Thrombocytopenia ([place]) PT ASYMPTOMATIC   MILD--  HEMOTOLOGIST--  DR [name]  . Venous insufficiency   . Wears glasses     Family History  Problem Relation Age of Onset  . Colon cancer Unknown     SOCIAL HISTORY: Social History   Socioeconomic History  . Marital status: Married    Spouse name: Not on file  . Number of children: Not on file  . Years of education: Not on file  . Highest education level: Not on file  Occupational History  . Occupation: retired  Scientific laboratory technician  . Financial resource strain: Not on file  . Food insecurity:    Worry: Not on file    Inability: Not on file  . Transportation needs:    Medical: Not on file    Non-medical: Not on file  Tobacco Use  . Smoking status: Former Smoker   Years: 42.00    Types: Cigarettes    Last attempt to quit: 10/27/1998    Years since quitting: 20.0  . Smokeless tobacco: Never Used  Substance and Sexual Activity  . Alcohol use: Yes    Comment: VERY RARE  . Drug use: No  . Sexual activity: Not on file  Lifestyle  . Physical activity:    Days per week: Not on file    Minutes per session: Not on file  . Stress: Not on file  Relationships  . Social connections:    Talks on phone: Not on file    Gets together: Not on file    Attends religious service: Not on file    Active member of club or organization: Not on file    Attends meetings of clubs or organizations: Not on file    Relationship status: Not on file  . Intimate partner violence:    Fear of current or ex partner: Not on file    Emotionally abused: Not on file    Physically abused: Not on file    Forced sexual activity: Not on file  Other Topics Concern  . Not on file  Social History Narrative  . Not on file    No Known Allergies  Current Outpatient Medications  Medication Sig Dispense Refill  . acetaminophen (TYLENOL)  325 MG tablet Take 325 mg by mouth every 6 (six) hours as needed for moderate pain.     . cetirizine (ZYRTEC) 10 MG tablet Take 10 mg by mouth daily.    [name] diltiazem (CARDIZEM CD) 360 MG 24 hr capsule TAKE 1 CAPSULE BY MOUTH EVERY DAY 90 capsule 2  . finasteride (PROPECIA) 1 MG tablet Take 1 mg by mouth daily.  11  . furosemide (LASIX) 40 MG tablet TAKE 1 TABLET BY MOUTH TWICE A DAY 180 tablet 3  . KLOR-CON M20 20 MEQ tablet TAKE 1 TABLET BY MOUTH TWICE A DAY 180 tablet 3  . metoprolol succinate (TOPROL-XL) 25 MG 24 hr tablet TAKE 1 TABLET BY MOUTH EVERY DAY 90 tablet 3  . mupirocin ointment (BACTROBAN) 2 % Apply 1 application topically daily.  2  . spironolactone (ALDACTONE) 25 MG tablet TAKE 1 TABLET BY MOUTH EVERY DAY 90 tablet 3  . warfarin (COUMADIN) 2 MG tablet Take as directed by coumadin clinic 30 tablet 3   No current facility-administered  medications for this visit.     REVIEW OF SYSTEMS:  [X]  denotes positive finding, [ ]  denotes negative finding Cardiac  Comments:  Chest pain or chest pressure:    Shortness of breath upon exertion:    Short of breath when lying flat:    Irregular heart rhythm: x       Vascular    Pain in calf, thigh, or hip brought on by ambulation:    Pain in feet at night that wakes you up from your sleep:     Blood clot in your veins:    Leg swelling:  x       Pulmonary    Oxygen at home:    Productive cough:     Wheezing:         Neurologic    Sudden weakness in arms or legs:     Sudden numbness in arms or legs:     Sudden onset of difficulty speaking or slurred speech:    Temporary loss of vision in one eye:     Problems with dizziness:         Gastrointestinal    Blood in stool:     Vomited blood:         Genitourinary    Burning when urinating:     Blood in urine:        Psychiatric    Major depression:         Hematologic    Bleeding problems:    Problems with blood clotting too easily:        Skin    Rashes or ulcers:        Constitutional    Fever or chills:      PHYSICAL EXAM: Vitals:   11/16/18 1459  BP: 122/74  Pulse: 66  Resp: 20  Temp: (!) 97 F (36.1 C)  TempSrc: Oral  SpO2: 97%  Weight: 230 lb (104.3 kg)  Height: 5\' 11"  (1.803 m)    GENERAL: The patient is a well-nourished male, in no acute distress. The vital signs are documented above. CARDIOVASCULAR: Plus radial pulses.  I do not palpate pedal pulses due to massive lower extremity swelling. PULMONARY: There is good air exchange  ABDOMEN: Soft and non-tender  MUSCULOSKELETAL: There are no major deformities or cyanosis. NEUROLOGIC: No focal weakness or paresthesias are detected. SKIN: Pitting edema bilaterally.  Marked changes of severe hyperkeratosis with hyperplasia and hyperpigmentation from his knees down  onto his feet bilaterally.  No current weeping.  Also has papillomatosis due to his  severe lymphedema.  The edema continues down onto his feet and toes bilaterally PSYCHIATRIC: The patient has a normal affect.  DATA:  Noninvasive venous reflux studies show reflux in his mid femoral vein.  Also reveals absence of his ablated great saphenous vein bilaterally  MEDICAL ISSUES: Severe advanced bilateral primary lymphedema.  He has tried elevation with minimal success in controlling this.  Has worn compression in the past but is a very difficult time applying this and has severe pain associated with the compression with his advanced lymphedema.  Had a long discussion with the patient and his wife regarding the significance of this diagnosis and the critical importance of controlling the progression.  I have recommended lymphedema pump sequential compression.  We will submit the required paperwork and hopefully can gain approval for this.  Will begin wrapping his legs with a 6 inch Ace from his foot up to his knees and continue to elevate when possible.  He did have an episode of diagnosed cellulitis which in all likelihood may have been lymphangitis.  I explained that this can progress very rapidly with high fevers and make him toxic very quickly.  He knows to seek immediate medical attention should he develop any erythema pain worsening or fevers for urgent antibiotic treatment   Rosetta Posner, MD [facility] Tel [phone] Pager [phone]

## 2018-11-17 ENCOUNTER — Telehealth: Payer: Self-pay | Admitting: Vascular Surgery

## 2018-11-17 NOTE — Telephone Encounter (Signed)
Per Dr. Donnetta Hutching, I faxed last clinical notes and order form to [facility] for Lymphadema compression.  Awaiting results.  Phone [phone], Ext [phone].   Thurston Hole., LPN

## 2018-11-26 ENCOUNTER — Telehealth (INDEPENDENT_AMBULATORY_CARE_PROVIDER_SITE_OTHER): Payer: Medicare Other

## 2018-11-26 DIAGNOSIS — I4891 Unspecified atrial fibrillation: Secondary | ICD-10-CM | POA: Diagnosis not present

## 2018-11-26 NOTE — Telephone Encounter (Signed)
Called pt and scheduled overdue inr for 12/01/18

## 2018-12-07 ENCOUNTER — Ambulatory Visit (INDEPENDENT_AMBULATORY_CARE_PROVIDER_SITE_OTHER): Payer: Medicare Other

## 2018-12-07 DIAGNOSIS — Z5181 Encounter for therapeutic drug level monitoring: Secondary | ICD-10-CM

## 2018-12-07 DIAGNOSIS — I4892 Unspecified atrial flutter: Secondary | ICD-10-CM

## 2018-12-07 DIAGNOSIS — I4891 Unspecified atrial fibrillation: Secondary | ICD-10-CM

## 2018-12-07 LAB — POCT INR: INR: 2.1 (ref 2.0–3.0)

## 2018-12-07 NOTE — Patient Instructions (Signed)
Continue on same dosage 1/2 tablet daily. Please pick a day (or days) each week to have a serving of greens and have them every week on that day. Recheck in 6 weeks.   Coumadin Clinic [phone]. Please call if ordered any new medication or if scheduled for any procedure.

## 2019-01-05 ENCOUNTER — Other Ambulatory Visit: Payer: Self-pay | Admitting: Cardiovascular Disease

## 2019-01-17 ENCOUNTER — Telehealth: Payer: Self-pay | Admitting: Cardiovascular Disease

## 2019-01-17 ENCOUNTER — Telehealth: Payer: Self-pay

## 2019-01-17 NOTE — Telephone Encounter (Signed)
New Message           Patient has cancelled his appointment and would like for Dr. [name] to let him know when he can see him. Pls call and advise.

## 2019-01-17 NOTE — Telephone Encounter (Signed)
CALLED TO PRESCREEN AND PT INFORMED ME THAT HE WAS SICK AND NEEDED TO CANCEL AND THAT HE WILL CALL THE CLINIC BACK

## 2019-01-17 NOTE — Telephone Encounter (Signed)
Attempted to call patient. Mailbox is full - unable to leave message.   Will try later and inform the patient he will be called when COVID-19 precautions are lifted to reschedule.

## 2019-01-20 ENCOUNTER — Ambulatory Visit: Payer: Medicare Other | Admitting: Cardiovascular Disease

## 2019-01-25 ENCOUNTER — Other Ambulatory Visit: Payer: Self-pay

## 2019-01-25 ENCOUNTER — Encounter: Payer: Self-pay | Admitting: Cardiovascular Disease

## 2019-01-25 ENCOUNTER — Telehealth (INDEPENDENT_AMBULATORY_CARE_PROVIDER_SITE_OTHER): Payer: Medicare Other | Admitting: Cardiovascular Disease

## 2019-01-25 VITALS — BP 145/76 | HR 71

## 2019-01-25 DIAGNOSIS — I4821 Permanent atrial fibrillation: Secondary | ICD-10-CM

## 2019-01-25 DIAGNOSIS — R002 Palpitations: Secondary | ICD-10-CM

## 2019-01-25 DIAGNOSIS — I5032 Chronic diastolic (congestive) heart failure: Secondary | ICD-10-CM

## 2019-01-25 DIAGNOSIS — I421 Obstructive hypertrophic cardiomyopathy: Secondary | ICD-10-CM

## 2019-01-25 NOTE — Telephone Encounter (Signed)
[name] is willing to have a telephone call appointment with Dr. [name] instead of his face to face visit. He declines Webex. Reviewed medication list and entered available vital signs into Epic - he does not have a recent weight.   Consent obtained for phone visit.   TELEPHONE CALL NOTE  Bradley Winters has been deemed a candidate for a follow-up tele-health visit to limit community exposure during the Covid-19 pandemic. I spoke with the patient via phone to ensure availability of phone/video source, confirm preferred email & phone number, and discuss instructions and expectations.  I reminded Bradley Winters to be prepared with any vital sign and/or heart rhythm information that could potentially be obtained via home monitoring, at the time of his visit. I reminded Bradley Winters to expect a phone call at the time of his visit if his visit.  Did the patient verbally acknowledge consent to treatment? YES  Bradley Parma, RN 01/25/2019 1:00 PM

## 2019-01-25 NOTE — Progress Notes (Signed)
Virtual Visit via Telephone Note    Evaluation Performed:  Follow-up visit  This visit type was conducted due to national recommendations for restrictions regarding the COVID-19 Pandemic (e.g. social distancing).  This format is felt to be most appropriate for this patient at this time.  All issues noted in this document were discussed and addressed.  No physical exam was performed (except for noted visual exam findings with Video Visits).  Please refer to the patient's chart (MyChart message for video visits and phone note for telephone visits) for the patient's consent to telehealth for [facility].  Date:  01/25/2019   ID:  Bradley Winters, DOB 07-23-1941, MRN [id number]  Patient Location:  home  Provider location:   [facility] Office  PCP:  Everardo Beals, NP  Cardiologist:  [name], MD  Electrophysiologist:  None   Chief Complaint:  Atrial fibrillation  History of Present Illness:    Bradley Winters is a 78 y.o. male who presents via audio/video conferencing for a telehealth visit today.    The patient does not have symptoms concerning for COVID-19 infection (fever, chills, cough, or new shortness of breath).   The patient is interviewed on the telephone today. He reports some improvement in his chronic leg swelling since he has seen Dr Donnetta Hutching. He has a machine that he's using periodically and he feels like it's doing a good job moving the fluid out of his legs. He has episodes where he wakes up with a 'startled feeling.' thinks this might be related to stress and anxiety. He has mild shortness of breath with activity. No orthopnea, PND, or chest pain.  Prior CV studies:   The following studies were reviewed today:  Echo 10/23/2017: Study Conclusions  - Left ventricle: The cavity size was normal. There was severe   concentric hypertrophy. Systolic function was normal. The   estimated ejection fraction was in the range of 60% to 65%. Wall   motion was normal;  there were no regional wall motion   abnormalities. The study is not technically sufficient to allow   evaluation of LV diastolic function. - Mitral valve: Mildly thickened leaflets . There was mild   regurgitation. - Left atrium: Severely dilated. - Right atrium: Moderately dilated. - Tricuspid valve: There was mild regurgitation. - Pulmonary arteries: PA peak pressure: 43 mm Hg (S). - Inferior vena cava: The vessel was normal in size. The   respirophasic diameter changes were in the normal range (>= 50%),   consistent with normal central venous pressure.  Impressions:  - Compared to a prior study in 10/2016, the LVEF is higher at   60-65%, there is severe LAE and moderate RAE, the RVSP is mildly   increased at 43 mmHg.   Past Medical History:  Diagnosis Date  . Anxiety    MOSTLY AT NIGHT  . Bladder cancer ([place])   . ED (erectile dysfunction)   . History of colonoscopy with polypectomy    BENIGN  . History of gastric ulcer   . History of seizures as a child    FEBRILE  . Hypertension   . Hypertrophic cardiomyopathy ([place])    HEREDITARY--  POSITIVE for MYBPC3  GENE (HETEROZYGOUS)  . Persistent atrial fibrillation    CARDIOLOGIST-- DR [name]  AND DR [name]  . PONV (postoperative nausea and vomiting)   . Thrombocytopenia ([place]) PT ASYMPTOMATIC   MILD--  HEMOTOLOGIST--  DR [name]  . Venous insufficiency   . Wears glasses    Past Surgical  History:  Procedure Laterality Date  . CARDIAC ELECTROPHYSIOLOGY MAPPING AND ABLATION  12-07-2008  &  05-08-2009  DR [name]   SUCCESSFUL ABLATION ATRIAL-FIBRILLATION AND CARDIOVERSION  . CARDIOVERSION  MULTIPLE -----  BETWEEN 2008  to  07-17-2009  . CATARACT EXTRACTION W/ INTRAOCULAR LENS IMPLANT Right   . SLEEP STUDY  2011   NEGATIVE  . TEE WITH CARDIOVERSION  11-27-2007  . TONSILLECTOMY AND ADENOIDECTOMY  AS CHILD  . TRANSTHORACIC ECHOCARDIOGRAM  06-16-2013  DR Johnesha Acheampong   PT HAS HX HCM/  MODERATE LVH with MILO SAM/  EF 55-60%/  MILD  MR/  SEVERE LAE/  MODERATE RAE  . TRANSURETHRAL RESECTION OF BLADDER TUMOR N/A 02/10/2014   Procedure: TRANSURETHRAL RESECTION OF BLADDER TUMOR (TURBT);  Surgeon: [name], MD;  Location: [facility];  Service: Urology;  Laterality: N/A;  . TRANSURETHRAL RESECTION OF BLADDER TUMOR N/A 04/07/2014   Procedure: TRANSURETHRAL  BLADDER BIOPSY;  Surgeon: [name], MD;  [facility];  Service: Urology;  Laterality: N/A;     Current Meds  Medication Sig  . acetaminophen (TYLENOL) 325 MG tablet Take 325 mg by mouth every 6 (six) hours as needed for moderate pain.   . cetirizine (ZYRTEC) 10 MG tablet Take 10 mg by mouth daily.  [name] diltiazem (CARDIZEM CD) 360 MG 24 hr capsule TAKE 1 CAPSULE BY MOUTH EVERY DAY  . finasteride (PROPECIA) 1 MG tablet Take 1 mg by mouth daily.  . furosemide (LASIX) 40 MG tablet TAKE 1 TABLET BY MOUTH TWICE A DAY  . KLOR-CON M20 20 MEQ tablet TAKE 1 TABLET BY MOUTH TWICE A DAY  . metoprolol succinate (TOPROL-XL) 25 MG 24 hr tablet TAKE 1 TABLET BY MOUTH EVERY DAY  . mupirocin ointment (BACTROBAN) 2 % Apply 1 application topically daily.  [name] spironolactone (ALDACTONE) 25 MG tablet TAKE 1 TABLET BY MOUTH EVERY DAY  . warfarin (COUMADIN) 2 MG tablet TAKE AS DIRECTED BY COUMADIN CLINIC     Allergies:   Patient has no known allergies.   Social History   Tobacco Use  . Smoking status: Former Smoker    Years: 42.00    Types: Cigarettes    Last attempt to quit: 10/27/1998    Years since quitting: 20.2  . Smokeless tobacco: Never Used  Substance Use Topics  . Alcohol use: Yes    Comment: VERY RARE  . Drug use: No     Family Hx: The patient's family history includes Colon cancer in his unknown relative.  ROS:   Please see the history of present illness.    Positive for nasal congestion, leg swelling.  All other systems reviewed and are negative.   Labs/Other Tests and Data Reviewed:    Recent Labs: No results found  for requested labs within last 8760 hours.   Recent Lipid Panel Lab Results  Component Value Date/Time   CHOL 130 05/22/2015 09:06 AM   TRIG 93.0 05/22/2015 09:06 AM   HDL 42.10 05/22/2015 09:06 AM   CHOLHDL 3 05/22/2015 09:06 AM   LDLCALC 69 05/22/2015 09:06 AM    Wt Readings from Last 3 Encounters:  11/16/18 230 lb (104.3 kg)  07/15/18 240 lb 12.8 oz (109.2 kg)  03/23/18 238 lb (108 kg)     Objective:    Vital Signs:  BP (!) 145/76   Pulse 71    Telephone encounter - no exam  ASSESSMENT & PLAN:    1.  Permanent atrial fibrillation: his heart rate  remains well-controlled on current Rx. Tolerating anticoagulation with warfarin.  2. Chronic diastolic heart failure, underlying HCM - remains stable, NYHA functional class 2 symptoms. Repeat echo in 6 months  3. Palpitations: unclear etiology. Having symptoms waking him up from sleep at times. Recommend event monitor. Will see if we can mail him a device during period of Covid restrictions.  Summary: today I recommended that he have lab work done when the Covid restrictions are lifted. This should include a CBC, CMET, lipid panel, and TSH. I would like him to have an echo at the time of his return visit in 6 months. We will mail him an event monitor to further evaluate his palpitations.   COVID-19 Education: The signs and symptoms of COVID-19 were discussed with the patient and how to seek care for testing (follow up with PCP or arrange E-visit). The importance of social distancing was discussed today.  Patient Risk:   After full review of this patient's clinical status, I feel that they are at least moderate risk at this time.  Time:   Today, I have spent 29 minutes with the patient with telehealth technology discussing issues above.     Medication Adjustments/Labs and Tests Ordered: Current medicines are reviewed at length with the patient today.  Concerns regarding medicines are outlined above.  Tests Ordered: No orders  of the defined types were placed in this encounter.  Medication Changes: No orders of the defined types were placed in this encounter.   Disposition:  Follow up in 6 month(s)  Signed, [name], MD  01/25/2019 1:35 PM    [facility]

## 2019-03-02 ENCOUNTER — Other Ambulatory Visit: Payer: Self-pay | Admitting: Cardiovascular Disease

## 2019-03-02 NOTE — Telephone Encounter (Signed)
Called spoke with pt, overdue for follow-up last INR 12/07/18. Made appt for INR check 03/10/19 pt states he has enough Warfarin to last until OV.  Pt aware we will refill rx after OV.

## 2019-03-09 ENCOUNTER — Telehealth: Payer: Self-pay

## 2019-03-09 NOTE — Telephone Encounter (Signed)

## 2019-03-10 ENCOUNTER — Ambulatory Visit (INDEPENDENT_AMBULATORY_CARE_PROVIDER_SITE_OTHER): Payer: Medicare Other | Admitting: *Deleted

## 2019-03-10 DIAGNOSIS — Z5181 Encounter for therapeutic drug level monitoring: Secondary | ICD-10-CM | POA: Diagnosis not present

## 2019-03-10 DIAGNOSIS — I4891 Unspecified atrial fibrillation: Secondary | ICD-10-CM

## 2019-03-10 DIAGNOSIS — I4892 Unspecified atrial flutter: Secondary | ICD-10-CM | POA: Diagnosis not present

## 2019-03-10 LAB — POCT INR: INR: 2.4 (ref 2.0–3.0)

## 2019-03-11 ENCOUNTER — Other Ambulatory Visit: Payer: Self-pay

## 2019-03-16 ENCOUNTER — Other Ambulatory Visit: Payer: Self-pay | Admitting: Cardiovascular Disease

## 2019-03-16 DIAGNOSIS — I482 Chronic atrial fibrillation, unspecified: Secondary | ICD-10-CM

## 2019-03-16 DIAGNOSIS — I422 Other hypertrophic cardiomyopathy: Secondary | ICD-10-CM

## 2019-04-14 ENCOUNTER — Telehealth: Payer: Self-pay

## 2019-04-14 ENCOUNTER — Telehealth: Payer: Self-pay | Admitting: Radiology

## 2019-04-14 NOTE — Telephone Encounter (Signed)
1. COVID-19 Pre-Screening Questions:  . In the past 7 to 10 days have you had a cough,  shortness of breath, headache, congestion, fever (100 or greater) body aches, chills, sore throat, or sudden loss of taste or sense of smell?  yes . Have you been around anyone with known Covid 19.  no . Have you been around anyone who is awaiting Covid 19 test results in the past 7 to 10 days?  no . Have you been around anyone who has been exposed to Covid 19, or has mentioned symptoms of Covid 19 within the past 7 to 10 days?  no    2. Pt advised of visitor restrictions (no visitors allowed except if needed to conduct the visit). Also advised to arrive at appointment time and wear a mask.

## 2019-04-14 NOTE — Telephone Encounter (Signed)
Enrolled patient for a 30 Day [employer] Event monitor to be mailed. Brief instructions were gone over with the patient and he knows to expect the monitor to arrive in 3-4 days 

## 2019-04-14 NOTE — Addendum Note (Signed)
Addended by: Harland German A on: 04/14/2019 12:13 PM   Modules accepted: Orders

## 2019-04-14 NOTE — Telephone Encounter (Signed)
Called to arrange lab work to coordinate with upcoming Coumadin appointment. The patient states he hasn't felt well and is awaiting COVID testing. He states he will contact the office when he feels healthy and safe enough to come in for labs. He understands his monitor will be mailed to him.  He was grateful for assistance.

## 2019-04-19 LAB — PROTIME-INR: INR: 2.3 — AB (ref <=1.1)

## 2019-04-26 ENCOUNTER — Ambulatory Visit (INDEPENDENT_AMBULATORY_CARE_PROVIDER_SITE_OTHER): Payer: Medicare Other | Admitting: Internal Medicine

## 2019-04-26 DIAGNOSIS — I4891 Unspecified atrial fibrillation: Secondary | ICD-10-CM

## 2019-04-26 DIAGNOSIS — I4892 Unspecified atrial flutter: Secondary | ICD-10-CM

## 2019-04-26 DIAGNOSIS — Z5181 Encounter for therapeutic drug level monitoring: Secondary | ICD-10-CM | POA: Diagnosis not present

## 2019-04-30 ENCOUNTER — Encounter (INDEPENDENT_AMBULATORY_CARE_PROVIDER_SITE_OTHER): Payer: Medicare Other

## 2019-04-30 DIAGNOSIS — I4821 Permanent atrial fibrillation: Secondary | ICD-10-CM

## 2019-05-02 ENCOUNTER — Telehealth: Payer: Self-pay

## 2019-05-02 NOTE — Telephone Encounter (Signed)
Sounds good thanks. He has known permanent afib

## 2019-05-02 NOTE — Telephone Encounter (Signed)
Received monitor strips for 04/30/19 at 8:39am.. from [employer] and it revealed Afib with Rate 80 and 70.. this was his Day 1/ baseline. Monitor ordered at his 01/25/19 visit for palpitations waking him up.    Per Dr. Lovena Le (DOD)... no changes in med regimen and to follow up with Dr.Cooper. Pt to have future labs after COVID restrictions lifted and Echo at time of his 6 month OV planned for 06/2019.    Pt says he is feeling well and has not had any problems recently.. he will keep a diary of his symptoms to try and coordinate with his monitor,... he says he has a lot of anxiety "episodes" and I advised him to keep track of those date and times too. Pt to call of he has any problems.

## 2019-05-24 ENCOUNTER — Telehealth: Payer: Self-pay | Admitting: *Deleted

## 2019-05-24 NOTE — Telephone Encounter (Signed)
Attempted to call pt and prescreen for his anticoag appointment 7/30.

## 2019-05-26 ENCOUNTER — Inpatient Hospital Stay (HOSPITAL_COMMUNITY)
Admission: EM | Admit: 2019-05-26 | Discharge: 2019-05-30 | DRG: 603 | Disposition: A | Discharge status: Home / Self Care | Payer: Medicare Other | Attending: Student | Admitting: Student

## 2019-05-26 ENCOUNTER — Emergency Department (HOSPITAL_COMMUNITY): Payer: Medicare Other

## 2019-05-26 ENCOUNTER — Other Ambulatory Visit: Payer: Self-pay

## 2019-05-26 ENCOUNTER — Encounter (HOSPITAL_COMMUNITY): Payer: Self-pay

## 2019-05-26 DIAGNOSIS — I872 Venous insufficiency (chronic) (peripheral): Secondary | ICD-10-CM | POA: Diagnosis not present

## 2019-05-26 DIAGNOSIS — E785 Hyperlipidemia, unspecified: Secondary | ICD-10-CM | POA: Diagnosis present

## 2019-05-26 DIAGNOSIS — Z7901 Long term (current) use of anticoagulants: Secondary | ICD-10-CM | POA: Diagnosis not present

## 2019-05-26 DIAGNOSIS — I878 Other specified disorders of veins: Secondary | ICD-10-CM | POA: Diagnosis not present

## 2019-05-26 DIAGNOSIS — Z20828 Contact with and (suspected) exposure to other viral communicable diseases: Secondary | ICD-10-CM | POA: Diagnosis present

## 2019-05-26 DIAGNOSIS — Z6834 Body mass index (BMI) 34.0-34.9, adult: Secondary | ICD-10-CM | POA: Diagnosis not present

## 2019-05-26 DIAGNOSIS — I10 Essential (primary) hypertension: Secondary | ICD-10-CM | POA: Diagnosis present

## 2019-05-26 DIAGNOSIS — M7989 Other specified soft tissue disorders: Secondary | ICD-10-CM

## 2019-05-26 DIAGNOSIS — Z87891 Personal history of nicotine dependence: Secondary | ICD-10-CM | POA: Diagnosis not present

## 2019-05-26 DIAGNOSIS — I482 Chronic atrial fibrillation, unspecified: Secondary | ICD-10-CM | POA: Diagnosis present

## 2019-05-26 DIAGNOSIS — I890 Lymphedema, not elsewhere classified: Secondary | ICD-10-CM | POA: Diagnosis present

## 2019-05-26 DIAGNOSIS — D696 Thrombocytopenia, unspecified: Secondary | ICD-10-CM | POA: Diagnosis not present

## 2019-05-26 DIAGNOSIS — Z8551 Personal history of malignant neoplasm of bladder: Secondary | ICD-10-CM

## 2019-05-26 DIAGNOSIS — L03115 Cellulitis of right lower limb: Principal | ICD-10-CM

## 2019-05-26 DIAGNOSIS — N400 Enlarged prostate without lower urinary tract symptoms: Secondary | ICD-10-CM | POA: Diagnosis present

## 2019-05-26 DIAGNOSIS — E7800 Pure hypercholesterolemia, unspecified: Secondary | ICD-10-CM | POA: Diagnosis not present

## 2019-05-26 DIAGNOSIS — I4891 Unspecified atrial fibrillation: Secondary | ICD-10-CM | POA: Diagnosis not present

## 2019-05-26 DIAGNOSIS — R609 Edema, unspecified: Secondary | ICD-10-CM | POA: Diagnosis not present

## 2019-05-26 DIAGNOSIS — L0390 Cellulitis, unspecified: Secondary | ICD-10-CM | POA: Diagnosis present

## 2019-05-26 LAB — CBC WITH DIFFERENTIAL/PLATELET
Abs Immature Granulocytes: 0.05 10*3/uL (ref 0.00–0.07)
Basophils Absolute: 0.1 10*3/uL (ref 0.0–0.1)
Basophils Relative: 0 %
Eosinophils Absolute: 0.2 10*3/uL (ref 0.0–0.5)
Eosinophils Relative: 1 %
HCT: 41 % (ref 39.0–52.0)
Hemoglobin: 12.7 g/dL — ABNORMAL LOW (ref 13.0–17.0)
Immature Granulocytes: 0 %
Lymphocytes Relative: 15 %
Lymphs Abs: 1.9 10*3/uL (ref 0.7–4.0)
MCH: 29.7 pg (ref 26.0–34.0)
MCHC: 31 g/dL (ref 30.0–36.0)
MCV: 96 fL (ref 80.0–100.0)
Monocytes Absolute: 1.4 10*3/uL — ABNORMAL HIGH (ref 0.1–1.0)
Monocytes Relative: 11 %
Neutro Abs: 9.5 10*3/uL — ABNORMAL HIGH (ref 1.7–7.7)
Neutrophils Relative %: 73 %
Platelets: 160 10*3/uL (ref 150–400)
RBC: 4.27 MIL/uL (ref 4.22–5.81)
RDW: 14.7 % (ref 11.5–15.5)
WBC: 13.1 10*3/uL — ABNORMAL HIGH (ref 4.0–10.5)
nRBC: 0 % (ref 0.0–0.2)

## 2019-05-26 LAB — PROTIME-INR
INR: 3 — ABNORMAL HIGH (ref 0.8–1.2)
Prothrombin Time: 30.9 seconds — ABNORMAL HIGH (ref 11.4–15.2)

## 2019-05-26 LAB — COMPREHENSIVE METABOLIC PANEL
ALT: 11 U/L (ref 0–44)
AST: 17 U/L (ref 15–41)
Albumin: 3.7 g/dL (ref 3.5–5.0)
Alkaline Phosphatase: 52 U/L (ref 38–126)
Anion gap: 11 (ref 5–15)
BUN: 18 mg/dL (ref 8–23)
CO2: 30 mmol/L (ref 22–32)
Calcium: 8.9 mg/dL (ref 8.9–10.3)
Chloride: 97 mmol/L — ABNORMAL LOW (ref 98–111)
Creatinine, Ser: 1.2 mg/dL (ref 0.61–1.24)
GFR calc Af Amer: >60 mL/min (ref >60)
GFR calc non Af Amer: 58 mL/min — ABNORMAL LOW (ref >60)
Glucose, Bld: 107 mg/dL — ABNORMAL HIGH (ref 70–99)
Potassium: 3.9 mmol/L (ref 3.5–5.1)
Sodium: 138 mmol/L (ref 135–145)
Total Bilirubin: 1.2 mg/dL (ref 0.3–1.2)
Total Protein: 7.6 g/dL (ref 6.5–8.1)

## 2019-05-26 LAB — BRAIN NATRIURETIC PEPTIDE: B Natriuretic Peptide: 177 pg/mL — ABNORMAL HIGH (ref 0.0–100.0)

## 2019-05-26 LAB — SARS CORONAVIRUS 2 BY RT PCR (HOSPITAL ORDER, PERFORMED IN ~~LOC~~ HOSPITAL LAB): SARS Coronavirus 2: NEGATIVE

## 2019-05-26 MED ORDER — PIPERACILLIN-TAZOBACTAM 3.375 G IVPB 30 MIN
3.3750 g | Freq: Once | INTRAVENOUS | Status: AC
  Administered 2019-05-27: 01:00:00 3.375 g via INTRAVENOUS
  Filled 2019-05-26: qty 50

## 2019-05-26 MED ORDER — DILTIAZEM HCL ER COATED BEADS 180 MG PO CP24
360.0000 mg | ORAL_CAPSULE | Freq: Every day | ORAL | Status: DC
  Administered 2019-05-27 – 2019-05-30 (×4): 360 mg via ORAL
  Filled 2019-05-26 (×4): qty 2

## 2019-05-26 MED ORDER — VANCOMYCIN HCL IN DEXTROSE 1-5 GM/200ML-% IV SOLN
1000.0000 mg | Freq: Once | INTRAVENOUS | Status: AC
  Administered 2019-05-26: 1000 mg via INTRAVENOUS
  Filled 2019-05-26: qty 200

## 2019-05-26 MED ORDER — SPIRONOLACTONE 25 MG PO TABS
25.0000 mg | ORAL_TABLET | Freq: Every day | ORAL | Status: DC
  Administered 2019-05-27 – 2019-05-30 (×4): 25 mg via ORAL
  Filled 2019-05-26 (×4): qty 1

## 2019-05-26 MED ORDER — MUPIROCIN 2 % EX OINT
1.0000 "application " | TOPICAL_OINTMENT | Freq: Every day | CUTANEOUS | Status: DC
  Administered 2019-05-27: 1 via TOPICAL
  Filled 2019-05-26: qty 22

## 2019-05-26 MED ORDER — ONDANSETRON HCL 4 MG/2ML IJ SOLN: 4.0000 mg | Freq: Four times a day (QID) | INTRAMUSCULAR | Status: DC | PRN

## 2019-05-26 MED ORDER — VANCOMYCIN HCL IN DEXTROSE 1-5 GM/200ML-% IV SOLN
1000.0000 mg | Freq: Once | INTRAVENOUS | Status: AC
  Administered 2019-05-27: 1000 mg via INTRAVENOUS
  Filled 2019-05-26: qty 200

## 2019-05-26 MED ORDER — POTASSIUM CHLORIDE CRYS ER 20 MEQ PO TBCR
20.0000 meq | EXTENDED_RELEASE_TABLET | Freq: Two times a day (BID) | ORAL | Status: DC
  Administered 2019-05-27 – 2019-05-30 (×7): 20 meq via ORAL
  Filled 2019-05-26 (×8): qty 1

## 2019-05-26 MED ORDER — ONDANSETRON HCL 4 MG PO TABS: 4.0000 mg | ORAL_TABLET | Freq: Four times a day (QID) | ORAL | Status: DC | PRN

## 2019-05-26 MED ORDER — ACETAMINOPHEN 325 MG PO TABS
325.0000 mg | ORAL_TABLET | Freq: Four times a day (QID) | ORAL | Status: DC | PRN
  Administered 2019-05-27: 325 mg via ORAL
  Filled 2019-05-26: qty 1

## 2019-05-26 MED ORDER — FLUTICASONE PROPIONATE 50 MCG/ACT NA SUSP
1.0000 | Freq: Every day | NASAL | Status: DC
  Administered 2019-05-27 – 2019-05-30 (×4): 1 via NASAL
  Filled 2019-05-26: qty 16

## 2019-05-26 MED ORDER — METOPROLOL SUCCINATE ER 25 MG PO TB24
25.0000 mg | ORAL_TABLET | Freq: Every day | ORAL | Status: DC
  Administered 2019-05-27 – 2019-05-29 (×3): 25 mg via ORAL
  Filled 2019-05-26 (×4): qty 1

## 2019-05-26 MED ORDER — LORATADINE 10 MG PO TABS
10.0000 mg | ORAL_TABLET | Freq: Every day | ORAL | Status: DC
  Administered 2019-05-27 – 2019-05-30 (×4): 10 mg via ORAL
  Filled 2019-05-26 (×4): qty 1

## 2019-05-26 MED ORDER — DOXYCYCLINE HYCLATE 100 MG PO TABS
100.0000 mg | ORAL_TABLET | Freq: Two times a day (BID) | ORAL | Status: DC
  Administered 2019-05-27: 100 mg via ORAL
  Filled 2019-05-26: qty 1

## 2019-05-26 MED ORDER — FINASTERIDE 1 MG PO TABS
1.0000 mg | ORAL_TABLET | Freq: Every day | ORAL | Status: DC
  Filled 2019-05-26: qty 1

## 2019-05-26 MED ORDER — FUROSEMIDE 40 MG PO TABS
40.0000 mg | ORAL_TABLET | Freq: Two times a day (BID) | ORAL | Status: DC
  Administered 2019-05-27 – 2019-05-30 (×7): 40 mg via ORAL
  Filled 2019-05-26 (×7): qty 1

## 2019-05-26 MED ORDER — SODIUM CHLORIDE 0.9 % IV SOLN
INTRAVENOUS | Status: DC | PRN
  Administered 2019-05-26: 1000 mL via INTRAVENOUS

## 2019-05-26 NOTE — H&P (Signed)
History and Physical   Bradley Winters:[id number] DOB: 1941/02/26 DOA: 05/26/2019  Referring MD/NP/PA: Dr. Vanita Panda  PCP: Everardo Beals, NP   Outpatient Specialists: None  Patient coming from: Home  Chief Complaint: Right leg swelling  HPI: Bradley Winters is a 78 y.o. male with medical history significant of chronic stasis edema with dermatitis, atrial fibrillation on chronic anticoagulation, anxiety disorder, bladder cancer, hypertension, morbid obesity, thrombocytopenia who has been dealing with bilateral lymphedema and stasis edema for years.  He has noticed recurrent swelling and redness of both legs.  He came in today with progressive worsening on the right with redness pain as well as swelling.  Patient denied scratching his feet.  He has crusted dry skin.  Patient noted to have significant right lower extremity cellulitis and is being admitted to the hospital for treatment.  He is tried outpatient treatment has has failed Keflex.  He will therefore be an inpatient secondary to failed outpatient therapy.  Denied any fever or chills no nausea vomiting or diarrhea..  ED Course: Temperature is 98.2 blood pressure 147/65 pulse 96 respirate of 18 oxygen sat 96 on room air.  White count is 13.1 hemoglobin 12.7 and platelet 160 chemistry appeared to be within normal.  INR is 3.0 and glucose 107.  Venous Doppler ultrasound of the right leg showed no evidence of DVT.  Patient initiated on IV antibiotics and being admitted to the hospital.  COVID-19 screen is negative.  Review of Systems: As per HPI otherwise 10 point review of systems negative.    Past Medical History:  Diagnosis Date  . Anxiety    MOSTLY AT NIGHT  . Bladder cancer ([place])   . ED (erectile dysfunction)   . History of colonoscopy with polypectomy    BENIGN  . History of gastric ulcer   . History of seizures as a child    FEBRILE  . Hypertension   . Hypertrophic cardiomyopathy ([place])    HEREDITARY--  POSITIVE  for MYBPC3  GENE (HETEROZYGOUS)  . Persistent atrial fibrillation    CARDIOLOGIST-- DR [name]  AND DR [name]  . PONV (postoperative nausea and vomiting)   . Thrombocytopenia ([place]) PT ASYMPTOMATIC   MILD--  HEMOTOLOGIST--  DR [name]  . Venous insufficiency   . Wears glasses     Past Surgical History:  Procedure Laterality Date  . CARDIAC ELECTROPHYSIOLOGY MAPPING AND ABLATION  12-07-2008  &  05-08-2009  DR [name]   SUCCESSFUL ABLATION ATRIAL-FIBRILLATION AND CARDIOVERSION  . CARDIOVERSION  MULTIPLE -----  BETWEEN 2008  to  07-17-2009  . CATARACT EXTRACTION W/ INTRAOCULAR LENS IMPLANT Right   . SLEEP STUDY  2011   NEGATIVE  . TEE WITH CARDIOVERSION  11-27-2007  . TONSILLECTOMY AND ADENOIDECTOMY  AS CHILD  . TRANSTHORACIC ECHOCARDIOGRAM  06-16-2013  DR COOPER   PT HAS HX HCM/  MODERATE LVH with MILO SAM/  EF 55-60%/  MILD MR/  SEVERE LAE/  MODERATE RAE  . TRANSURETHRAL RESECTION OF BLADDER TUMOR N/A 02/10/2014   Procedure: TRANSURETHRAL RESECTION OF BLADDER TUMOR (TURBT);  Surgeon: [name], MD;  Location[facility];  Service: Urology;  Laterality: N/A;  . TRANSURETHRAL RESECTION OF BLADDER TUMOR N/A 04/07/2014   Procedure: TRANSURETHRAL  BLADDER BIOPSY;  Surgeon: [name], MD;  Location: [facility];  Service: Urology;  Laterality: N/A;     reports that he quit smoking about 20 years ago. His smoking use included cigarettes. He quit after 42.00 years of use.  He has never used smokeless tobacco. He reports current alcohol use. He reports that he does not use drugs.  Allergies  Allergen Reactions  . Prednisone Other (See Comments)    Can't take because of heart medication    Family History  Problem Relation Age of Onset  . Colon cancer Other      Prior to Admission medications   Medication Sig Start Date End Date Taking? Authorizing Provider  acetaminophen (TYLENOL) 325 MG tablet Take 325 mg by mouth every 6 (six) hours as needed for  moderate pain.    Yes [provider]  cetirizine (ZYRTEC) 10 MG tablet Take 10 mg by mouth daily.   Yes [provider]  diltiazem (CARDIZEM CD) 360 MG 24 hr capsule TAKE 1 CAPSULE BY MOUTH EVERY DAY Patient taking differently: Take 360 mg by mouth daily.  09/27/18  Yes [name], MD  doxycycline (VIBRAMYCIN) 100 MG capsule Take 100 mg by mouth 2 (two) times daily. 05/05/19  Yes [provider]  finasteride (PROPECIA) 1 MG tablet Take 1 mg by mouth daily. 08/25/16  Yes [provider]  fluticasone (FLONASE) 50 MCG/ACT nasal spray Place 1 spray into both nostrils daily. 04/19/19  Yes [provider]  furosemide (LASIX) 40 MG tablet TAKE 1 TABLET BY MOUTH TWICE A DAY Patient taking differently: Take 40 mg by mouth 2 (two) times daily.  08/16/18  Yes [name], MD  KLOR-CON M20 20 MEQ tablet TAKE 1 TABLET BY MOUTH TWICE A DAY Patient taking differently: Take 20 mEq by mouth 2 (two) times daily.  03/16/19  Yes [name], MD  metoprolol succinate (TOPROL-XL) 25 MG 24 hr tablet TAKE 1 TABLET BY MOUTH EVERY DAY Patient taking differently: Take 25 mg by mouth daily.  11/16/18  Yes [name], MD  mupirocin ointment (BACTROBAN) 2 % Apply 1 application topically daily. 04/27/18  Yes [provider]  spironolactone (ALDACTONE) 25 MG tablet TAKE 1 TABLET BY MOUTH EVERY DAY Patient taking differently: Take 25 mg by mouth daily.  11/16/18  Yes [name], MD  warfarin (COUMADIN) 2 MG tablet TAKE AS DIRECTED BY COUMADIN CLINIC Patient taking differently: Take 1 mg by mouth daily.  03/10/19  Yes [name], MD    Physical Exam: Vitals:   05/26/19 1813 05/26/19 1814 05/26/19 1830 05/26/19 2000  BP: 120/71  134/69 (!) 147/65  Pulse: 91 91 88 96  Resp: 18  16   Temp:      TempSrc:      SpO2: 96% 96% 99% 97%  Weight:          Constitutional: NAD, calm, morbidly obese Vitals:   05/26/19 1813 05/26/19 1814 05/26/19 1830  05/26/19 2000  BP: 120/71  134/69 (!) 147/65  Pulse: 91 91 88 96  Resp: 18  16   Temp:      TempSrc:      SpO2: 96% 96% 99% 97%  Weight:       Eyes: PERRL, lids and conjunctivae normal ENMT: Mucous membranes are moist. Posterior pharynx clear of any exudate or lesions.Normal dentition.  Neck: normal, supple, no masses, no thyromegaly Respiratory: clear to auscultation bilaterally, no wheezing, no crackles. Normal respiratory effort. No accessory muscle use.  Cardiovascular: Irregularly irregular rate and rhythm no murmurs / rubs / gallops. 3+ extremity edema. 2+ pedal pulses. No carotid bruits.  Abdomen: no tenderness, no masses palpated. No hepatosplenomegaly. Bowel sounds positive.  Musculoskeletal: no clubbing / cyanosis. No joint deformity upper and lower  extremities. Good ROM, no contractures. Normal muscle tone.  Skin: Right lower extremity red warm to touch with crusted lesions.  Bilateral edema with dry skin, no ulcers Neurologic: CN 2-12 grossly intact. Sensation intact, DTR normal. Strength 5/5 in all 4.  Psychiatric: Normal judgment and insight. Alert and oriented x 3. Normal mood.     Labs on Admission: I have personally reviewed following labs and imaging studies  CBC: Recent Labs  Lab 05/26/19 1706  WBC 13.1*  NEUTROABS 9.5*  HGB 12.7*  HCT 41.0  MCV 96.0  PLT 270   Basic Metabolic Panel: Recent Labs  Lab 05/26/19 1706  NA 138  K 3.9  CL 97*  CO2 30  GLUCOSE 107*  BUN 18  CREATININE 1.20  CALCIUM 8.9   GFR: CrCl cannot be calculated (Unknown ideal weight.). Liver Function Tests: Recent Labs  Lab 05/26/19 1706  AST 17  ALT 11  ALKPHOS 52  BILITOT 1.2  PROT 7.6  ALBUMIN 3.7   No results for input(s): LIPASE, AMYLASE in the last 168 hours. No results for input(s): AMMONIA in the last 168 hours. Coagulation Profile: Recent Labs  Lab 05/26/19 1706  INR 3.0*   Cardiac Enzymes: No results for input(s): CKTOTAL, CKMB, CKMBINDEX, TROPONINI in  the last 168 hours. BNP (last 3 results) No results for input(s): PROBNP in the last 8760 hours. HbA1C: No results for input(s): HGBA1C in the last 72 hours. CBG: No results for input(s): GLUCAP in the last 168 hours. Lipid Profile: No results for input(s): CHOL, HDL, LDLCALC, TRIG, CHOLHDL, LDLDIRECT in the last 72 hours. Thyroid Function Tests: No results for input(s): TSH, T4TOTAL, FREET4, T3FREE, THYROIDAB in the last 72 hours. Anemia Panel: No results for input(s): VITAMINB12, FOLATE, FERRITIN, TIBC, IRON, RETICCTPCT in the last 72 hours. Urine analysis:    Component Value Date/Time   COLORURINE LT. YELLOW 06/14/2010 1135   APPEARANCEUR CLEAR 06/14/2010 1135   LABSPEC 1.015 06/14/2010 1135   PHURINE 5.5 06/14/2010 1135   GLUCOSEU NEGATIVE 06/14/2010 1135   BILIRUBINUR NEGATIVE 06/14/2010 1135   KETONESUR NEGATIVE 06/14/2010 1135   UROBILINOGEN 0.2 06/14/2010 1135   NITRITE NEGATIVE 06/14/2010 1135   LEUKOCYTESUR NEGATIVE 06/14/2010 1135   Sepsis Labs: @LABRCNTIP (procalcitonin:4,lacticidven:4) ) Recent Results (from the past 240 hour(s))  SARS Coronavirus 2 (CEPHEID - Performed in [place] hospital lab), Hosp Order     Status: None   Collection Time: 05/26/19  6:13 PM   Specimen: Nasopharyngeal Swab  Result Value Ref Range Status   SARS Coronavirus 2 NEGATIVE NEGATIVE Final    Comment: (NOTE) If result is NEGATIVE SARS-CoV-2 target nucleic acids are NOT DETECTED. The SARS-CoV-2 RNA is generally detectable in upper and lower  respiratory specimens during the acute phase of infection. The lowest  concentration of SARS-CoV-2 viral copies this assay can detect is 250  copies / mL. A negative result does not preclude SARS-CoV-2 infection  and should not be used as the sole basis for treatment or other  patient management decisions.  A negative result may occur with  improper specimen collection / handling, submission of specimen other  than nasopharyngeal swab,  presence of viral mutation(s) within the  areas targeted by this assay, and inadequate number of viral copies  (<250 copies / mL). A negative result must be combined with clinical  observations, patient history, and epidemiological information. If result is POSITIVE SARS-CoV-2 target nucleic acids are DETECTED. The SARS-CoV-2 RNA is generally detectable in upper and lower  respiratory specimens dur  ing the acute phase of infection.  Positive  results are indicative of active infection with SARS-CoV-2.  Clinical  correlation with patient history and other diagnostic information is  necessary to determine patient infection status.  Positive results do  not rule out bacterial infection or co-infection with other viruses. If result is PRESUMPTIVE POSTIVE SARS-CoV-2 nucleic acids MAY BE PRESENT.   A presumptive positive result was obtained on the submitted specimen  and confirmed on repeat testing.  While 2019 novel coronavirus  (SARS-CoV-2) nucleic acids may be present in the submitted sample  additional confirmatory testing may be necessary for epidemiological  and / or clinical management purposes  to differentiate between  SARS-CoV-2 and other Sarbecovirus currently known to infect humans.  If clinically indicated additional testing with an alternate test  methodology [phone]) is advised. The SARS-CoV-2 RNA is generally  detectable in upper and lower respiratory sp ecimens during the acute  phase of infection. The expected result is Negative. Fact Sheet for Patients:  StrictlyIdeas.no Fact Sheet for Healthcare Providers: BankingDealers.co.za This test is not yet approved or cleared by the [place] FDA and has been authorized for detection and/or diagnosis of SARS-CoV-2 by FDA under an Emergency Use Authorization (EUA).  This EUA will remain in effect (meaning this test can be used) for the duration of the COVID-19 declaration under  Section 564(b)(1) of the Act, 21 U.S.C. section 360bbb-3(b)(1), unless the authorization is terminated or revoked sooner. Performed at [facility], [place] [address] [place], [place] [address]      Radiological Exams on Admission: Vas [place] Lower Extremity Venous (dvt) (only [facility])  Result Date: 05/26/2019  Lower Venous Study Indications: Swelling, and Edema.  Limitations: Body habitus and poor ultrasound/tissue interface. Comparison Study: previous study done 03/24/18 Performing Technologist: Abram Sander RVS  Examination Guidelines: A complete evaluation includes B-mode imaging, spectral Doppler, color Doppler, and power Doppler as needed of all accessible portions of each vessel. Bilateral testing is considered an integral part of a complete examination. Limited examinations for reoccurring indications may be performed as noted.  +---------+---------------+---------+-----------+----------+--------------+ RIGHT    CompressibilityPhasicitySpontaneityPropertiesSummary        +---------+---------------+---------+-----------+----------+--------------+ CFV      Full           Yes      Yes                                 +---------+---------------+---------+-----------+----------+--------------+ SFJ      Full                                                        +---------+---------------+---------+-----------+----------+--------------+ FV Prox  Full                                                        +---------+---------------+---------+-----------+----------+--------------+ FV Mid   Full                                                        +---------+---------------+---------+-----------+----------+--------------+  FV Distal               Yes      Yes                                 +---------+---------------+---------+-----------+----------+--------------+ PFV      Full                                                         +---------+---------------+---------+-----------+----------+--------------+ POP      Full           Yes      Yes                                 +---------+---------------+---------+-----------+----------+--------------+ PTV      Full                                                        +---------+---------------+---------+-----------+----------+--------------+ PERO                                                  Not visualized +---------+---------------+---------+-----------+----------+--------------+   +----+---------------+---------+-----------+----------+-------+ LEFTCompressibilityPhasicitySpontaneityPropertiesSummary +----+---------------+---------+-----------+----------+-------+ CFV Full           Yes      Yes                          +----+---------------+---------+-----------+----------+-------+     Summary: Right: There is no evidence of deep vein thrombosis in the lower extremity. Left: No evidence of common femoral vein obstruction.  *See table(s) above for measurements and observations.    Preliminary      Assessment/Plan Principal Problem:   Cellulitis Active Problems:   HYPERLIPIDEMIA, FAMILIAL   ATRIAL FIBRILLATION   Chronic venous insufficiency     #1 right lower extremity cellulitis: Patient will be admitted and treated with IV vancomycin and Zosyn.  He failed outpatient therapy.  We will get blood cultures elevate the feet and monitor.  #2 chronic stasis edema: Patient has what appears to be lymphedema.  Will need compression stockings and other supportive care.  Elevate the feet.  #3 atrial fibrillation: Rate is controlled.  Continue warfarin per pharmacy.  #4 hypertension: Continue home regimen.  Blood pressure is controlled.  #5 morbid obesity: Counseling will be provided.  #6 hyperlipidemia: Continue home regimen.    DVT prophylaxis: Warfarin Code Status: Full code Family Communication: Wife over the phone Disposition Plan:  Home Consults called: None Admission status: Inpatient  Severity of Illness: The appropriate patient status for this patient is INPATIENT. Inpatient status is judged to be reasonable and necessary in order to provide the required intensity of service to ensure the patient's safety. The patient's presenting symptoms, physical exam findings, and initial radiographic and laboratory data in the context of their chronic comorbidities is felt to place them at high risk for further clinical deterioration. Furthermore,  it is not anticipated that the patient will be medically stable for discharge from the hospital within 2 midnights of admission. The following factors support the patient status of inpatient.   " The patient's presenting symptoms include right lower extremity swelling. " The worrisome physical exam findings include redness in the right lower extremity. " The initial radiographic and laboratory data are worrisome because of leukocytosis. " The chronic co-morbidities include chronic venous stasis edema.   * I certify that at the point of admission it is my clinical judgment that the patient will require inpatient hospital care spanning beyond 2 midnights from the point of admission due to high intensity of service, high risk for further deterioration and high frequency of surveillance required.Barbette Merino MD [facility] Pager [phone]  If 7PM-7AM, please contact night-coverage [email or web address] Password [email or web address]  05/26/2019, 10:20 PM

## 2019-05-26 NOTE — ED Notes (Signed)
Pt moved up in bed for comfort.

## 2019-05-26 NOTE — ED Triage Notes (Signed)
Patient c/o lymphedema x 1 month. Patient has been going to his PCP and was taking antibiotics. Patient has increased redness to the right leg and swelling. Patient was sent fby his PCP for further treatment.

## 2019-05-26 NOTE — ED Notes (Signed)
ED Provider at bedside. 

## 2019-05-26 NOTE — Progress Notes (Signed)
Lower extremity venous has been completed.   Preliminary results in CV Proc.   Abram Sander 05/26/2019 5:10 PM

## 2019-05-26 NOTE — ED Notes (Addendum)
ED TO INPATIENT HANDOFF REPORT  ED Nurse Name and Phone #: Maylon Cos [phone]  S Name/Age/Gender Bradley Winters 77 y.o. male Room/Bed: WA15/WA15  Code Status   Code Status: Not on file  Home/SNF/Other Home Patient oriented to: self, place, time and situation Is this baseline? Yes   Triage Complete: Triage complete  Chief Complaint Abnormal Labs  Triage Note Patient c/o lymphedema x 1 month. Patient has been going to his PCP and was taking antibiotics. Patient has increased redness to the right leg and swelling. Patient was sent fby his PCP for further treatment.   Allergies Allergies  Allergen Reactions  . Prednisone Other (See Comments)    Can't take because of heart medication    Level of Care/Admitting Diagnosis ED Disposition    ED Disposition Condition [facility] Area: [place] [[id number]]  Level of Care: Med-Surg [16]  Covid Evaluation: Asymptomatic Screening Protocol (No Symptoms)  Diagnosis: Cellulitis [[id number]]  Admitting Physician: Elwyn Reach [[id number]]  Attending Physician: Elwyn Reach [[id number]]  Estimated length of stay: past midnight tomorrow  Certification:: I certify this patient will need inpatient services for at least 2 midnights  PT Class (Do Not Modify): Inpatient [101]  PT Acc Code (Do Not Modify): Private [1]       B Medical/Surgery History Past Medical History:  Diagnosis Date  . Anxiety    MOSTLY AT NIGHT  . Bladder cancer ([place])   . ED (erectile dysfunction)   . History of colonoscopy with polypectomy    BENIGN  . History of gastric ulcer   . History of seizures as a child    FEBRILE  . Hypertension   . Hypertrophic cardiomyopathy ([place])    HEREDITARY--  POSITIVE for MYBPC3  GENE (HETEROZYGOUS)  . Persistent atrial fibrillation    CARDIOLOGIST-- DR [name]  AND DR [name]  . PONV (postoperative nausea and vomiting)   . Thrombocytopenia ([place] II) PT ASYMPTOMATIC   MILD--  HEMOTOLOGIST--  DR  [name]  . Venous insufficiency   . Wears glasses    Past Surgical History:  Procedure Laterality Date  . CARDIAC ELECTROPHYSIOLOGY MAPPING AND ABLATION  12-07-2008  &  05-08-2009  DR [name]   SUCCESSFUL ABLATION ATRIAL-FIBRILLATION AND CARDIOVERSION  . CARDIOVERSION  MULTIPLE -----  BETWEEN 2008  to  07-17-2009  . CATARACT EXTRACTION W/ INTRAOCULAR LENS IMPLANT Right   . SLEEP STUDY  2011   NEGATIVE  . TEE WITH CARDIOVERSION  11-27-2007  . TONSILLECTOMY AND ADENOIDECTOMY  AS CHILD  . TRANSTHORACIC ECHOCARDIOGRAM  06-16-2013  DR COOPER   PT HAS HX HCM/  MODERATE LVH with MILO SAM/  EF 55-60%/  MILD MR/  SEVERE LAE/  MODERATE RAE  . TRANSURETHRAL RESECTION OF BLADDER TUMOR N/A 02/10/2014   Procedure: TRANSURETHRAL RESECTION OF BLADDER TUMOR (TURBT);  Surgeon: [name], MD;  Location: [facility];  Service: Urology;  Laterality: N/A;  . TRANSURETHRAL RESECTION OF BLADDER TUMOR N/A 04/07/2014   Procedure: TRANSURETHRAL  BLADDER BIOPSY;  Surgeon: [name], MD;  Location[facility];  Service: Urology;  Laterality: N/A;     A IV Location/Drains/Wounds Patient Lines/Drains/Airways Status   Active Line/Drains/Airways    Name:   Placement date:   Placement time:   Site:   Days:   Peripheral IV 05/26/19 Right Antecubital   05/26/19    1701    Antecubital   less than 1   Incision (Closed) 02/10/14 Penis Other (Comment)  02/10/14    0750     1931   Incision (Closed) 04/07/14 Perineum Other (Comment)   04/07/14    0754     1875          Intake/Output Last 24 hours No intake or output data in the 24 hours ending 05/26/19 2312  Labs/Imaging Results for orders placed or performed during the hospital encounter of 05/26/19 (from the past 48 hour(s))  Brain natriuretic peptide     Status: Abnormal   Collection Time: 05/26/19  5:02 PM  Result Value Ref Range   B Natriuretic Peptide 177.0 (H) 0.0 - 100.0 pg/mL    Comment: Performed at [facility], [place] [address], [place], [place] [address]  Comprehensive metabolic panel     Status: Abnormal   Collection Time: 05/26/19  5:06 PM  Result Value Ref Range   Sodium 138 135 - 145 mmol/L   Potassium 3.9 3.5 - 5.1 mmol/L   Chloride 97 (L) 98 - 111 mmol/L   CO2 30 22 - 32 mmol/L   Glucose, Bld 107 (H) 70 - 99 mg/dL   BUN 18 8 - 23 mg/dL   Creatinine, Ser 1.20 0.61 - 1.24 mg/dL   Calcium 8.9 8.9 - 10.3 mg/dL   Total Protein 7.6 6.5 - 8.1 g/dL   Albumin 3.7 3.5 - 5.0 g/dL   AST 17 15 - 41 U/L   ALT 11 0 - 44 U/L   Alkaline Phosphatase 52 38 - 126 U/L   Total Bilirubin 1.2 0.3 - 1.2 mg/dL   GFR calc non Af Amer 58 (L) >60 mL/min   GFR calc Af Amer >60 >60 mL/min   Anion gap 11 5 - 15    Comment: Performed at [facility], [place] [address], [place], [place] [address]  CBC with Differential     Status: Abnormal   Collection Time: 05/26/19  5:06 PM  Result Value Ref Range   WBC 13.1 (H) 4.0 - 10.5 K/uL   RBC 4.27 4.22 - 5.81 MIL/uL   Hemoglobin 12.7 (L) 13.0 - 17.0 g/dL   HCT 41.0 39.0 - 52.0 %   MCV 96.0 80.0 - 100.0 fL   MCH 29.7 26.0 - 34.0 pg   MCHC 31.0 30.0 - 36.0 g/dL   RDW 14.7 11.5 - 15.5 %   Platelets 160 150 - 400 K/uL   nRBC 0.0 0.0 - 0.2 %   Neutrophils Relative % 73 %   Neutro Abs 9.5 (H) 1.7 - 7.7 K/uL   Lymphocytes Relative 15 %   Lymphs Abs 1.9 0.7 - 4.0 K/uL   Monocytes Relative 11 %   Monocytes Absolute 1.4 (H) 0.1 - 1.0 K/uL   Eosinophils Relative 1 %   Eosinophils Absolute 0.2 0.0 - 0.5 K/uL   Basophils Relative 0 %   Basophils Absolute 0.1 0.0 - 0.1 K/uL   Immature Granulocytes 0 %   Abs Immature Granulocytes 0.05 0.00 - 0.07 K/uL    Comment: Performed at [facility], [place] [address], [place], [place] [address]  Protime-INR     Status: Abnormal   Collection Time: 05/26/19  5:06 PM  Result Value Ref Range   Prothrombin Time 30.9 (H) 11.4 - 15.2 seconds   INR 3.0 (H) 0.8 - 1.2    Comment:  (NOTE) INR goal varies based on device and disease states. Performed at [facility], [place] [address] [place], [place] [address]   SARS Coronavirus  2 (CEPHEID - Performed in [place] lab), Hosp Order     Status: None   Collection Time: 05/26/19  6:13 PM   Specimen: Nasopharyngeal Swab  Result Value Ref Range   SARS Coronavirus 2 NEGATIVE NEGATIVE    Comment: (NOTE) If result is NEGATIVE SARS-CoV-2 target nucleic acids are NOT DETECTED. The SARS-CoV-2 RNA is generally detectable in upper and lower  respiratory specimens during the acute phase of infection. The lowest  concentration of SARS-CoV-2 viral copies this assay can detect is 250  copies / mL. A negative result does not preclude SARS-CoV-2 infection  and should not be used as the sole basis for treatment or other  patient management decisions.  A negative result may occur with  improper specimen collection / handling, submission of specimen other  than nasopharyngeal swab, presence of viral mutation(s) within the  areas targeted by this assay, and inadequate number of viral copies  (<250 copies / mL). A negative result must be combined with clinical  observations, patient history, and epidemiological information. If result is POSITIVE SARS-CoV-2 target nucleic acids are DETECTED. The SARS-CoV-2 RNA is generally detectable in upper and lower  respiratory specimens dur ing the acute phase of infection.  Positive  results are indicative of active infection with SARS-CoV-2.  Clinical  correlation with patient history and other diagnostic information is  necessary to determine patient infection status.  Positive results do  not rule out bacterial infection or co-infection with other viruses. If result is PRESUMPTIVE POSTIVE SARS-CoV-2 nucleic acids MAY BE PRESENT.   A presumptive positive result was obtained on the submitted specimen  and confirmed on repeat testing.  While 2019 novel coronavirus   (SARS-CoV-2) nucleic acids may be present in the submitted sample  additional confirmatory testing may be necessary for epidemiological  and / or clinical management purposes  to differentiate between  SARS-CoV-2 and other Sarbecovirus currently known to infect humans.  If clinically indicated additional testing with an alternate test  methodology [id number]) is advised. The SARS-CoV-2 RNA is generally  detectable in upper and lower respiratory sp ecimens during the acute  phase of infection. The expected result is Negative. Fact Sheet for Patients:  StrictlyIdeas.no Fact Sheet for Healthcare Providers: BankingDealers.co.za This test is not yet approved or cleared by the [place] FDA and has been authorized for detection and/or diagnosis of SARS-CoV-2 by FDA under an Emergency Use Authorization (EUA).  This EUA will remain in effect (meaning this test can be used) for the duration of the COVID-19 declaration under Section 564(b)(1) of the Act, 21 U.S.C. section 360bbb-3(b)(1), unless the authorization is terminated or revoked sooner. Performed at [facility], [place] [address], [place], [place] [address]    Vas [place] Lower Extremity Venous (dvt) (only [facility])  Result Date: 05/26/2019  Lower Venous Study Indications: Swelling, and Edema.  Limitations: Body habitus and poor ultrasound/tissue interface. Comparison Study: previous study done 03/24/18 Performing Technologist: Abram Sander RVS  Examination Guidelines: A complete evaluation includes B-mode imaging, spectral Doppler, color Doppler, and power Doppler as needed of all accessible portions of each vessel. Bilateral testing is considered an integral part of a complete examination. Limited examinations for reoccurring indications may be performed as noted.  +---------+---------------+---------+-----------+----------+--------------+ RIGHT     CompressibilityPhasicitySpontaneityPropertiesSummary        +---------+---------------+---------+-----------+----------+--------------+ CFV      Full           Yes      Yes                                 +---------+---------------+---------+-----------+----------+--------------+  SFJ      Full                                                        +---------+---------------+---------+-----------+----------+--------------+ FV Prox  Full                                                        +---------+---------------+---------+-----------+----------+--------------+ FV Mid   Full                                                        +---------+---------------+---------+-----------+----------+--------------+ FV Distal               Yes      Yes                                 +---------+---------------+---------+-----------+----------+--------------+ PFV      Full                                                        +---------+---------------+---------+-----------+----------+--------------+ POP      Full           Yes      Yes                                 +---------+---------------+---------+-----------+----------+--------------+ PTV      Full                                                        +---------+---------------+---------+-----------+----------+--------------+ PERO                                                  Not visualized +---------+---------------+---------+-----------+----------+--------------+   +----+---------------+---------+-----------+----------+-------+ LEFTCompressibilityPhasicitySpontaneityPropertiesSummary +----+---------------+---------+-----------+----------+-------+ CFV Full           Yes      Yes                          +----+---------------+---------+-----------+----------+-------+     Summary: Right: There is no evidence of deep vein thrombosis in the lower extremity. Left: No evidence of common  femoral vein obstruction.  *See table(s) above for measurements and observations.    Preliminary     Pending Labs [employer] (From admission, onward)    Start     Ordered   Signed and Held  Comprehensive metabolic panel  Tomorrow morning,   R  Signed and Held   Signed and Held  CBC  Tomorrow morning,   R     Signed and Held          Vitals/Pain Today's Vitals   05/26/19 1937 05/26/19 2000 05/26/19 2200 05/26/19 2230  BP:  (!) 147/65  119/62  Pulse:  96  70  Resp:    16  Temp:      TempSrc:      SpO2:  97%  97%  Weight:      Height:   5\' 11"  (1.803 m)   PainSc: 2        Isolation Precautions No active isolations  Medications Medications  0.9 %  sodium chloride infusion (1,000 mLs Intravenous New Bag/Given 05/26/19 1802)  vancomycin (VANCOCIN) IVPB 1000 mg/200 mL premix (has no administration in time range)  piperacillin-tazobactam (ZOSYN) IVPB 3.375 g (has no administration in time range)  vancomycin (VANCOCIN) IVPB 1000 mg/200 mL premix (0 mg Intravenous Stopped 05/26/19 1905)    Mobility walks with person assist Moderate fall risk     R Recommendations: See Admitting Provider Note  Report given to: Shalom RN  Additional Notes:

## 2019-05-26 NOTE — ED Provider Notes (Signed)
[place] DEPT Provider Note   CSN: [id number] Arrival date & time: 05/26/19  1337    History   Chief Complaint Chief Complaint  Patient presents with   lymphedema    HPI Bradley Winters is a 78 y.o. male.     HPI Patient presents with concern of increasing erythema, swelling in his right lower extremity. He has a history of lymphedema bilaterally, but over the past month has had increasing swelling in the right leg. He also developed erythema about 2 weeks ago, and was started on Keflex. He notes that he is been taking his medication regularly. However, yesterday patient developed fever, and his erythema became worse, with diffuse spreading throughout the entirety of the lower leg, ascending into the mid thigh. Fever improved with Tylenol, but with increasing erythema he saw his physician and was sent here for evaluation. Patient knowledges multiple other medical issues, states that he takes his other medication as directed.  This includes Coumadin for A. fib.  Past Medical History:  Diagnosis Date   Anxiety    MOSTLY AT NIGHT   Bladder cancer [facility])    ED (erectile dysfunction)    History of colonoscopy with polypectomy    BENIGN   History of gastric ulcer    History of seizures as a child    FEBRILE   Hypertension    Hypertrophic cardiomyopathy ([place])    HEREDITARY--  POSITIVE for MYBPC3  GENE (HETEROZYGOUS)   Persistent atrial fibrillation    CARDIOLOGIST-- DR [name]  AND DR [name]   PONV (postoperative nausea and vomiting)    Thrombocytopenia ([place]) PT ASYMPTOMATIC   MILD--  HEMOTOLOGIST--  DR [name]   Venous insufficiency    Wears glasses     Patient Active Problem List   Diagnosis Date Noted   Bladder tumor 02/10/2014   Encounter for therapeutic drug monitoring 11/29/2013   Chronic venous insufficiency 03/11/2012   Thrombocytopenia ([place]) 10/17/2011   HYPERLIPIDEMIA, 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/2010   TREMOR  05/31/2010   EDEMA 01/17/2010   HYPERTENSION, UNSPECIFIED 01/23/2009   Hypertrophic obstructive cardiomyopathy ([place]) 01/23/2009   ATRIAL FIBRILLATION 01/23/2009   ATRIAL FLUTTER 01/23/2009    Past Surgical History:  Procedure Laterality Date   CARDIAC ELECTROPHYSIOLOGY MAPPING AND ABLATION  12-07-2008  &  05-08-2009  DR [name]   SUCCESSFUL ABLATION ATRIAL-FIBRILLATION AND CARDIOVERSION   CARDIOVERSION  MULTIPLE -----  BETWEEN 2008  to  07-17-2009   CATARACT EXTRACTION W/ INTRAOCULAR LENS IMPLANT Right    SLEEP STUDY  2011   NEGATIVE   TEE WITH CARDIOVERSION  11-27-2007   TONSILLECTOMY AND ADENOIDECTOMY  AS CHILD   TRANSTHORACIC ECHOCARDIOGRAM  06-16-2013  DR COOPER   PT HAS HX HCM/  MODERATE LVH with MILO SAM/  EF 55-60%/  MILD MR/  SEVERE LAE/  MODERATE RAE   TRANSURETHRAL RESECTION OF BLADDER TUMOR N/A 02/10/2014   Procedure: TRANSURETHRAL RESECTION OF BLADDER TUMOR (TURBT);  Surgeon: [name], MD;  Location: [facility];  Service: Urology;  Laterality: N/A;   TRANSURETHRAL RESECTION OF BLADDER TUMOR N/A 04/07/2014   Procedure: TRANSURETHRAL  BLADDER BIOPSY;  Surgeon: [name], MD;  Location: [facility];  Service: Urology;  Laterality: N/A;        Home Medications    Prior to Admission medications   Medication Sig Start Date End Date Taking? Authorizing Provider  acetaminophen (TYLENOL) 325 MG tablet Take 325 mg by mouth every 6 (six) hours as needed for moderate pain.  Yes [provider]  cetirizine (ZYRTEC) 10 MG tablet Take 10 mg by mouth daily.   Yes [provider]  diltiazem (CARDIZEM CD) 360 MG 24 hr capsule TAKE 1 CAPSULE BY MOUTH EVERY DAY Patient taking differently: Take 360 mg by mouth daily.  09/27/18  Yes [name], MD  doxycycline (VIBRAMYCIN) 100 MG capsule Take 100 mg by mouth 2 (two) times daily. 05/05/19  Yes [provider]  finasteride (PROPECIA) 1 MG tablet Take 1 mg by  mouth daily. 08/25/16  Yes [provider]  fluticasone (FLONASE) 50 MCG/ACT nasal spray Place 1 spray into both nostrils daily. 04/19/19  Yes [provider]  furosemide (LASIX) 40 MG tablet TAKE 1 TABLET BY MOUTH TWICE A DAY Patient taking differently: Take 40 mg by mouth 2 (two) times daily.  08/16/18  Yes [name], MD  KLOR-CON M20 20 MEQ tablet TAKE 1 TABLET BY MOUTH TWICE A DAY Patient taking differently: Take 20 mEq by mouth 2 (two) times daily.  03/16/19  Yes [name], MD  metoprolol succinate (TOPROL-XL) 25 MG 24 hr tablet TAKE 1 TABLET BY MOUTH EVERY DAY Patient taking differently: Take 25 mg by mouth daily.  11/16/18  Yes [name], MD  mupirocin ointment (BACTROBAN) 2 % Apply 1 application topically daily. 04/27/18  Yes [provider]  spironolactone (ALDACTONE) 25 MG tablet TAKE 1 TABLET BY MOUTH EVERY DAY Patient taking differently: Take 25 mg by mouth daily.  11/16/18  Yes [name], MD  warfarin (COUMADIN) 2 MG tablet TAKE AS DIRECTED BY COUMADIN CLINIC Patient taking differently: Take 1 mg by mouth daily.  03/10/19  Yes [name], MD    Family History Family History  Problem Relation Age of Onset   Colon cancer Other     Social History Social History   Tobacco Use   Smoking status: Former Smoker    Years: 42.00    Types: Cigarettes    Quit date: 10/27/1998    Years since quitting: 20.5   Smokeless tobacco: Never Used  Substance Use Topics   Alcohol use: Yes    Comment: VERY RARE   Drug use: No     Allergies   Prednisone   Review of Systems Review of Systems  Constitutional:       Per HPI, otherwise negative  HENT:       Per HPI, otherwise negative  Respiratory:       Per HPI, otherwise negative  Cardiovascular:       Per HPI, otherwise negative  Gastrointestinal: Negative for vomiting.  Endocrine:       Negative aside from HPI  Genitourinary:       Neg aside from HPI   Musculoskeletal:        Per HPI, otherwise negative  Skin: Positive for color change and wound.  Neurological: Negative for syncope.     Physical Exam Updated Vital Signs BP 136/74 (BP Location: Left Arm)    Pulse 91    Temp 98.2 F (36.8 C) (Oral)    Resp 18    Wt 112.8 kg    SpO2 96%    BMI 34.67 kg/m   Physical Exam Vitals signs and nursing note reviewed.  Constitutional:      General: He is not in acute distress.    Appearance: He is well-developed.  HENT:     Head: Normocephalic and atraumatic.  Eyes:     Conjunctiva/sclera: Conjunctivae normal.  Cardiovascular:     Rate and  Rhythm: Normal rate and regular rhythm.  Pulmonary:     Effort: Pulmonary effort is normal. No respiratory distress.     Breath sounds: No stridor.  Abdominal:     General: There is no distension.  Musculoskeletal:     Comments: Substantial lymphedematous changes of both legs, with anterior calf cobblestoning on the left, right leg with substantial cobblestoning, multiple open small sores, and diffuse erythema throughout the entirety of the distal extremity. Right leg is roughly twice the size of the left.  Skin:    General: Skin is warm and dry.  Neurological:     Mental Status: He is alert and oriented to person, place, and time.      ED Treatments / Results  Labs (all labs ordered are listed, but only abnormal results are displayed)6:16 PM  Labs Reviewed  COMPREHENSIVE METABOLIC PANEL - Abnormal; Notable for the following components:      Result Value   Chloride 97 (*)    Glucose, Bld 107 (*)    GFR calc non Af Amer 58 (*)    All other components within normal limits  CBC WITH DIFFERENTIAL/PLATELET - Abnormal; Notable for the following components:   WBC 13.1 (*)    Hemoglobin 12.7 (*)    Neutro Abs 9.5 (*)    Monocytes Absolute 1.4 (*)    All other components within normal limits  PROTIME-INR - Abnormal; Notable for the following components:   Prothrombin Time 30.9 (*)    INR 3.0 (*)    All other  components within normal limits  SARS CORONAVIRUS 2 (HOSPITAL ORDER, [place] [facility])  BRAIN NATRIURETIC PEPTIDE    EKG None  Radiology Vas [place] Lower Extremity Venous (dvt) (only [facility])  Result Date: 05/26/2019  Lower Venous Study Indications: Swelling, and Edema.  Limitations: Body habitus and poor ultrasound/tissue interface. Comparison Study: previous study done 03/24/18 Performing Technologist: Abram Sander RVS  Examination Guidelines: A complete evaluation includes B-mode imaging, spectral Doppler, color Doppler, and power Doppler as needed of all accessible portions of each vessel. Bilateral testing is considered an integral part of a complete examination. Limited examinations for reoccurring indications may be performed as noted.  +---------+---------------+---------+-----------+----------+--------------+  RIGHT     Compressibility Phasicity Spontaneity Properties Summary         +---------+---------------+---------+-----------+----------+--------------+  CFV       Full            Yes       Yes                                    +---------+---------------+---------+-----------+----------+--------------+  SFJ       Full                                                             +---------+---------------+---------+-----------+----------+--------------+  FV Prox   Full                                                             +---------+---------------+---------+-----------+----------+--------------+  FV Mid    Full                                                             +---------+---------------+---------+-----------+----------+--------------+  FV Distal                 Yes       Yes                                    +---------+---------------+---------+-----------+----------+--------------+  PFV       Full                                                             +---------+---------------+---------+-----------+----------+--------------+  POP       Full             Yes       Yes                                    +---------+---------------+---------+-----------+----------+--------------+  PTV       Full                                                             +---------+---------------+---------+-----------+----------+--------------+  PERO                                                       Not visualized  +---------+---------------+---------+-----------+----------+--------------+   +----+---------------+---------+-----------+----------+-------+  LEFT Compressibility Phasicity Spontaneity Properties Summary  +----+---------------+---------+-----------+----------+-------+  CFV  Full            Yes       Yes                             +----+---------------+---------+-----------+----------+-------+     Summary: Right: There is no evidence of deep vein thrombosis in the lower extremity. Left: No evidence of common femoral vein obstruction.  *See table(s) above for measurements and observations.    Preliminary     Procedures Procedures (including critical care time)  Medications Ordered in ED Medications  vancomycin (VANCOCIN) IVPB 1000 mg/200 mL premix (1,000 mg Intravenous New Bag/Given 05/26/19 1805)  0.9 %  sodium chloride infusion (1,000 mLs Intravenous New Bag/Given 05/26/19 1802)     Initial Impression / Assessment and Plan / ED Course  I have reviewed the triage vital signs and the nursing notes.  Pertinent labs & imaging results that were available during my care of the patient were reviewed by me and considered in my medical decision making (see chart for details).  Immediately after the initial evaluation with concern for DVT versus infection the patient had ultrasound performed, received vancomycin, fluids.  This elderly male with multiple medical issues including chronic lymphedema presents with new evidence for cellulitis refractory to oral antibiotics. Patient is awake and alert, afebrile here, but given these findings, patient  required initiation of vancomycin, admission for further monitoring, management.  Final Clinical Impressions(s) / ED Diagnoses   Final diagnoses:  Cellulitis of right lower extremity     Carmin Muskrat, MD 05/26/19 1816

## 2019-05-26 NOTE — ED Notes (Signed)
Vas [place] at bedside

## 2019-05-27 LAB — COMPREHENSIVE METABOLIC PANEL
ALT: 12 U/L (ref 0–44)
AST: 14 U/L — ABNORMAL LOW (ref 15–41)
Albumin: 3.1 g/dL — ABNORMAL LOW (ref 3.5–5.0)
Alkaline Phosphatase: 43 U/L (ref 38–126)
Anion gap: 9 (ref 5–15)
BUN: 16 mg/dL (ref 8–23)
CO2: 28 mmol/L (ref 22–32)
Calcium: 8.5 mg/dL — ABNORMAL LOW (ref 8.9–10.3)
Chloride: 100 mmol/L (ref 98–111)
Creatinine, Ser: 1 mg/dL (ref 0.61–1.24)
GFR calc Af Amer: >60 mL/min (ref >60)
GFR calc non Af Amer: >60 mL/min (ref >60)
Glucose, Bld: 108 mg/dL — ABNORMAL HIGH (ref 70–99)
Potassium: 3.5 mmol/L (ref 3.5–5.1)
Sodium: 137 mmol/L (ref 135–145)
Total Bilirubin: 1 mg/dL (ref 0.3–1.2)
Total Protein: 6.5 g/dL (ref 6.5–8.1)

## 2019-05-27 LAB — C-REACTIVE PROTEIN: CRP: 11.9 mg/dL — ABNORMAL HIGH (ref <1.0)

## 2019-05-27 LAB — SEDIMENTATION RATE: Sed Rate: 49 mm/hr — ABNORMAL HIGH (ref 0–16)

## 2019-05-27 LAB — CBC
HCT: 37.1 % — ABNORMAL LOW (ref 39.0–52.0)
Hemoglobin: 11.8 g/dL — ABNORMAL LOW (ref 13.0–17.0)
MCH: 30.5 pg (ref 26.0–34.0)
MCHC: 31.8 g/dL (ref 30.0–36.0)
MCV: 95.9 fL (ref 80.0–100.0)
Platelets: 128 10*3/uL — ABNORMAL LOW (ref 150–400)
RBC: 3.87 MIL/uL — ABNORMAL LOW (ref 4.22–5.81)
RDW: 14.6 % (ref 11.5–15.5)
WBC: 9.9 10*3/uL (ref 4.0–10.5)
nRBC: 0 % (ref 0.0–0.2)

## 2019-05-27 LAB — PROTIME-INR
INR: 2.8 — ABNORMAL HIGH (ref 0.8–1.2)
Prothrombin Time: 29.2 seconds — ABNORMAL HIGH (ref 11.4–15.2)

## 2019-05-27 MED ORDER — PIPERACILLIN-TAZOBACTAM 3.375 G IVPB
3.3750 g | Freq: Three times a day (TID) | INTRAVENOUS | Status: DC
  Administered 2019-05-27 – 2019-05-29 (×7): 3.375 g via INTRAVENOUS
  Filled 2019-05-27 (×8): qty 50

## 2019-05-27 MED ORDER — TRAZODONE HCL 50 MG PO TABS
50.0000 mg | ORAL_TABLET | Freq: Once | ORAL | Status: AC
  Administered 2019-05-27: 50 mg via ORAL
  Filled 2019-05-27: qty 1

## 2019-05-27 MED ORDER — WARFARIN SODIUM 1 MG PO TABS
1.0000 mg | ORAL_TABLET | Freq: Once | ORAL | Status: AC
  Administered 2019-05-27: 1 mg via ORAL
  Filled 2019-05-27: qty 1

## 2019-05-27 MED ORDER — WARFARIN - PHARMACIST DOSING INPATIENT: Freq: Every day | Status: DC

## 2019-05-27 MED ORDER — VANCOMYCIN HCL 10 G IV SOLR
1500.0000 mg | INTRAVENOUS | Status: DC
  Administered 2019-05-27: 1500 mg via INTRAVENOUS
  Filled 2019-05-27: qty 1500

## 2019-05-27 NOTE — Progress Notes (Addendum)
ANTICOAGULATION CONSULT NOTE - Initial Consult  Pharmacy Consult for warfarin Indication: DVT  Allergies  Allergen Reactions  . Prednisone Other (See Comments)    Can't take because of heart medication    Patient Measurements: Height: 5\' 11"  (180.3 cm) Weight: 248 lb 9.6 oz (112.8 kg) IBW/kg (Calculated) : 75.3 Heparin Dosing Weight:   Vital Signs: Temp: 99 F (37.2 C) (07/30 2331) Temp Source: Oral (07/30 2331) BP: 122/58 (07/30 2331) Pulse Rate: 86 (07/30 2331)  Labs: Recent Labs    05/26/19 1706  HGB 12.7*  HCT 41.0  PLT 160  LABPROT 30.9*  INR 3.0*  CREATININE 1.20    Estimated Creatinine Clearance: 64.8 mL/min (by C-G formula based on SCr of 1.2 mg/dL).   Medical History: Past Medical History:  Diagnosis Date  . Anxiety    MOSTLY AT NIGHT  . Bladder cancer ([place])   . ED (erectile dysfunction)   . History of colonoscopy with polypectomy    BENIGN  . History of gastric ulcer   . History of seizures as a child    FEBRILE  . Hypertension   . Hypertrophic cardiomyopathy ([place])    HEREDITARY--  POSITIVE for MYBPC3  GENE (HETEROZYGOUS)  . Persistent atrial fibrillation    CARDIOLOGIST-- DR [name]  AND DR [name]  . PONV (postoperative nausea and vomiting)   . Thrombocytopenia ([place]) PT ASYMPTOMATIC   MILD--  HEMOTOLOGIST--  DR [name]  . Venous insufficiency   . Wears glasses     Medications:  Medications Prior to Admission  Medication Sig Dispense Refill Last Dose  . acetaminophen (TYLENOL) 325 MG tablet Take 325 mg by mouth every 6 (six) hours as needed for moderate pain.    05/26/2019 at Unknown time  . cetirizine (ZYRTEC) 10 MG tablet Take 10 mg by mouth daily.   05/26/2019 at Unknown time  . diltiazem (CARDIZEM CD) 360 MG 24 hr capsule TAKE 1 CAPSULE BY MOUTH EVERY DAY (Patient taking differently: Take 360 mg by mouth daily. ) 90 capsule 2 05/26/2019 at 11am  . doxycycline (VIBRAMYCIN) 100 MG capsule Take 100 mg by mouth 2 (two) times daily.   05/26/2019  at Unknown time  . finasteride (PROPECIA) 1 MG tablet Take 1 mg by mouth daily.  11 05/26/2019 at 11am  . fluticasone (FLONASE) 50 MCG/ACT nasal spray Place 1 spray into both nostrils daily.   05/26/2019 at Unknown time  . furosemide (LASIX) 40 MG tablet TAKE 1 TABLET BY MOUTH TWICE A DAY (Patient taking differently: Take 40 mg by mouth 2 (two) times daily. ) 180 tablet 3 05/26/2019 at Unknown time  . KLOR-CON M20 20 MEQ tablet TAKE 1 TABLET BY MOUTH TWICE A DAY (Patient taking differently: Take 20 mEq by mouth 2 (two) times daily. ) 180 tablet 0 05/26/2019 at Unknown time  . metoprolol succinate (TOPROL-XL) 25 MG 24 hr tablet TAKE 1 TABLET BY MOUTH EVERY DAY (Patient taking differently: Take 25 mg by mouth daily. ) 90 tablet 3 05/26/2019 at 11am  . mupirocin ointment (BACTROBAN) 2 % Apply 1 application topically daily.  2 05/26/2019 at Unknown time  . spironolactone (ALDACTONE) 25 MG tablet TAKE 1 TABLET BY MOUTH EVERY DAY (Patient taking differently: Take 25 mg by mouth daily. ) 90 tablet 3 05/26/2019 at Unknown time  . warfarin (COUMADIN) 2 MG tablet TAKE AS DIRECTED BY COUMADIN CLINIC (Patient taking differently: Take 1 mg by mouth daily. ) 30 tablet 1 05/26/2019 at 11am   Scheduled:  . diltiazem  360 mg Oral Daily  . doxycycline  100 mg Oral BID  . finasteride  1 mg Oral Daily  . fluticasone  1 spray Each Nare Daily  . furosemide  40 mg Oral BID  . loratadine  10 mg Oral Daily  . metoprolol succinate  25 mg Oral Daily  . mupirocin ointment  1 application Topical Daily  . potassium chloride SA  20 mEq Oral BID  . spironolactone  25 mg Oral Daily  . Warfarin - Pharmacist Dosing Inpatient   Does not apply q1800    Assessment: Patient with chronic warfarin with last dose noted on 7/30 at 1100.  INR on admit = 3.  Goal of Therapy:  INR 2-3    Plan:  No warfarin at this time. Daily INR  Nani Skillern Crowford 05/27/2019,3:16 AM

## 2019-05-27 NOTE — Progress Notes (Signed)
Pharmacy Antibiotic Note  Bradley Winters is a 78 y.o. male admitted on 05/26/2019 with cellulitis.  Pharmacy has been consulted for Vancomycin and Zosyn dosing.  Plan: Vancomycin 1gm iv x1, then another 1gm iv x1, then Vancomycin 1500 mg IV Q 24 hrs. Goal AUC 400-550. Expected AUC: 540 SCr used: 1.2  Zosyn 3.375gm iv x1, then Zosyn 3.375g IV Q8H infused over 4hrs.    Height: 5\' 11"  (180.3 cm) Weight: 248 lb 9.6 oz (112.8 kg) IBW/kg (Calculated) : 75.3  Temp (24hrs), Avg:98.6 F (37 C), Min:98.2 F (36.8 C), Max:99 F (37.2 C)  Recent Labs  Lab 05/26/19 1706  WBC 13.1*  CREATININE 1.20    Estimated Creatinine Clearance: 64.8 mL/min (by C-G formula based on SCr of 1.2 mg/dL).    Allergies  Allergen Reactions  . Prednisone Other (See Comments)    Can't take because of heart medication    Antimicrobials this admission: Vancomycin 05/26/2019 >> Zosyn 05/27/2019 >>   Dose adjustments this admission: -  Microbiology results: -  Thank you for allowing pharmacy to be a part of this patient's care.  Nani Skillern Crowford 05/27/2019 3:15 AM

## 2019-05-27 NOTE — Progress Notes (Addendum)
ANTICOAGULATION CONSULT NOTE - Follow Up Consult  Pharmacy Consult for Warfarin  Indication: atrial fibrillation  Allergies  Allergen Reactions  . Prednisone Other (See Comments)    Can't take because of heart medication    Patient Measurements: Height: 5\' 11"  (180.3 cm) Weight: 248 lb 9.6 oz (112.8 kg) IBW/kg (Calculated) : 75.3  Vital Signs: Temp: 97.7 F (36.5 C) (07/31 1422) BP: 125/81 (07/31 1422) Pulse Rate: 84 (07/31 1422)  Labs: Recent Labs    05/26/19 1706 05/27/19 0257 05/27/19 0328  HGB 12.7* 11.8*  --   HCT 41.0 37.1*  --   PLT 160 128*  --   LABPROT 30.9*  --  29.2*  INR 3.0*  --  2.8*  CREATININE 1.20 1.00  --     Estimated Creatinine Clearance: 77.8 mL/min (by C-G formula based on SCr of 1 mg/dL).   Medications:  PTA Warfarin 1mg  PO daily - last dose 05/26/2019 at 11am  Assessment: 78 y/oM on warfarin PTA for history of a-fib admitted for cellulitis of right leg. Pharmacy consulted to assist with dosing of warfarin while patient in the hospital.   INR = 2.8 CBC: Hgb 11.8, Pltc decreased to 128K Broad spectrum antibiotics can increase INR   Goal of Therapy:  INR 2-3 Monitor platelets by anticoagulation protocol: Yes   Plan:  Warfarin 1mg  PO x 1 today Daily PT/INR Monitor closely for s/sx of bleeding   Lindell Spar M 05/27/2019,7:17 PM

## 2019-05-27 NOTE — Progress Notes (Addendum)
[facility] Progress Note  Patient: Bradley Winters:[id number]   PCP: Everardo Beals, NP DOB: [birth year]   DOA: 05/26/2019   DOS: 05/27/2019   Date of Service: the patient was seen and examined on 05/27/2019  Brief hospital course: Pt. with PMH of chronic stasis edema, dermatitis, A. fib, anxiety, bladder cancer, HTN, thrombocytopenia; admitted on 05/26/2019, presented with complaint of worsening swelling, was found to have cellulitis of right leg. Currently further plan is continue IV antibiotics.  Subjective: Continues to have pain in the leg continues to have warmth and redness.  No nausea no vomiting no fever no chills.  No diarrhea no chest depression.  Assessment and Plan: 1.  Right leg cellulitis. Failed outpatient antibiotics with oral Keflex. We will continue with IV vancomycin and Zosyn. Monitor on cultures. Elevate feet.  2.  Chronic right leg lymphedema. Will benefit from outpatient lymphedema clinic follow-up. Compression stocking once a infection gets better.  3.  Chronic A. fib. Continue home regimen. Continue anticoagulation.  4.  Essential hypertension. Blood pressure stable. Continue to monitor.  5.  Obesity. Body mass index is 34.67 kg/m.  Will benefit from weight loss. Outpatient follow-up.  Diet: Cardiac diet DVT Prophylaxis: Therapeutic Anticoagulation with Warfarin  Advance goals of care discussion:  Full code  Family Communication: no family was present at bedside, at the time of interview. The pt provided permission to discuss medical plan with the family. Opportunity was given to ask question and all questions were answered satisfactorily.   Disposition:  Discharge to Home .  Consultants: none Procedures: none  Scheduled Meds: . diltiazem  360 mg Oral Daily  . finasteride  1 mg Oral Daily  . fluticasone  1 spray Each Nare Daily  . furosemide  40 mg Oral BID  . loratadine  10 mg Oral Daily  . metoprolol succinate  25 mg  Oral Daily  . mupirocin ointment  1 application Topical Daily  . potassium chloride SA  20 mEq Oral BID  . spironolactone  25 mg Oral Daily  . Warfarin - Pharmacist Dosing Inpatient   Does not apply q1800   Continuous Infusions: . sodium chloride 1,000 mL (05/26/19 1802)  . piperacillin-tazobactam (ZOSYN)  IV 3.375 g (05/27/19 1437)  . vancomycin     PRN Meds: sodium chloride, acetaminophen, ondansetron **OR** ondansetron (ZOFRAN) IV Antibiotics: Anti-infectives (From admission, onward)   Start     Dose/Rate Route Frequency Ordered Stop   05/27/19 2200  vancomycin (VANCOCIN) 1,500 mg in sodium chloride 0.9 % 500 mL IVPB     1,500 mg 250 mL/hr over 120 Minutes Intravenous Every 24 hours 05/27/19 0314     05/27/19 1000  doxycycline (VIBRA-TABS) tablet 100 mg  Status:  Discontinued     100 mg Oral 2 times daily 05/26/19 2345 05/27/19 0941   05/27/19 0600  piperacillin-tazobactam (ZOSYN) IVPB 3.375 g     3.375 g 12.5 mL/hr over 240 Minutes Intravenous Every 8 hours 05/27/19 0311     05/26/19 2230  vancomycin (VANCOCIN) IVPB 1000 mg/200 mL premix     1,000 mg 200 mL/hr over 60 Minutes Intravenous  Once 05/26/19 2222 05/27/19 0102   05/26/19 2230  piperacillin-tazobactam (ZOSYN) IVPB 3.375 g     3.375 g 100 mL/hr over 30 Minutes Intravenous  Once 05/26/19 2222 05/27/19 0155   05/26/19 1715  vancomycin (VANCOCIN) IVPB 1000 mg/200 mL premix     1,000 mg 200 mL/hr over 60 Minutes Intravenous  Once 05/26/19 1712 05/26/19 1905  Objective: Physical Exam: Vitals:   05/26/19 2230 05/26/19 2331 05/27/19 0424 05/27/19 1422  BP: 119/62 (!) 122/58 (!) 109/59 125/81  Pulse: 70 86 78 84  Resp: 16 18 18 17   Temp:  99 F (37.2 C) 98.5 F (36.9 C) 97.7 F (36.5 C)  TempSrc:  Oral Oral   SpO2: 97% 97% 93% 97%  Weight:      Height:        Intake/Output Summary (Last 24 hours) at 05/27/2019 1840 Last data filed at 05/27/2019 1700 Gross per 24 hour  Intake 1830.65 ml  Output 1825 ml   Net 5.65 ml   Filed Weights   05/26/19 1344  Weight: 112.8 kg   General: alert and oriented to time, place, and person. Appear in mild distress, affect appropriate Eyes: PERRL, Conjunctiva normal ENT: Oral Mucosa Clear, moist  Neck: no JVD, no Abnormal Mass Or lumps Cardiovascular: S1 and S2 Present, no Murmur, peripheral pulses symmetrical Respiratory: normal respiratory effort, Bilateral Air entry equal and Decreased, no use of accessory muscle, Clear to Auscultation, no Crackles, no wheezes Abdomen: Bowel Sound present, Soft and no tenderness, no hernia Skin: Bilateral dermatitis, right leg cellulitis Extremities: bilateral  Pedal edema, no calf tenderness Neurologic: normal without focal findings, mental status, speech normal, alert and oriented x3, PERLA, Motor strength 5/5 and symmetric and sensation grossly normal to light touch Gait not checked due to patient safety concerns      Data Reviewed: CBC: Recent Labs  Lab 05/26/19 1706 05/27/19 0257  WBC 13.1* 9.9  NEUTROABS 9.5*  --   HGB 12.7* 11.8*  HCT 41.0 37.1*  MCV 96.0 95.9  PLT 160 333*   Basic Metabolic Panel: Recent Labs  Lab 05/26/19 1706 05/27/19 0257  NA 138 137  K 3.9 3.5  CL 97* 100  CO2 30 28  GLUCOSE 107* 108*  BUN 18 16  CREATININE 1.20 1.00  CALCIUM 8.9 8.5*    Liver Function Tests: Recent Labs  Lab 05/26/19 1706 05/27/19 0257  AST 17 14*  ALT 11 12  ALKPHOS 52 43  BILITOT 1.2 1.0  PROT 7.6 6.5  ALBUMIN 3.7 3.1*   No results for input(s): LIPASE, AMYLASE in the last 168 hours. No results for input(s): AMMONIA in the last 168 hours. Coagulation Profile: Recent Labs  Lab 05/26/19 1706 05/27/19 0328  INR 3.0* 2.8*   Cardiac Enzymes: No results for input(s): CKTOTAL, CKMB, CKMBINDEX, TROPONINI in the last 168 hours. BNP (last 3 results) No results for input(s): PROBNP in the last 8760 hours. CBG: No results for input(s): GLUCAP in the last 168 hours. Studies: No results  found.   Time spent: 35 minutes  Author: Berle Mull, MD [facility] 05/27/2019 6:40 PM  To reach On-call, see care teams to locate the attending and reach out to them via [email or web address]. If 7PM-7AM, please contact night-coverage If you still have difficulty reaching the attending provider, please page the [facility] (Director on Call) for [facility] on amion for assistance.

## 2019-05-28 DIAGNOSIS — I878 Other specified disorders of veins: Secondary | ICD-10-CM

## 2019-05-28 DIAGNOSIS — I890 Lymphedema, not elsewhere classified: Secondary | ICD-10-CM

## 2019-05-28 LAB — CBC WITH DIFFERENTIAL/PLATELET
Abs Immature Granulocytes: 0.03 10*3/uL (ref 0.00–0.07)
Basophils Absolute: 0.1 10*3/uL (ref 0.0–0.1)
Basophils Relative: 1 %
Eosinophils Absolute: 0.5 10*3/uL (ref 0.0–0.5)
Eosinophils Relative: 6 %
HCT: 38.6 % — ABNORMAL LOW (ref 39.0–52.0)
Hemoglobin: 11.7 g/dL — ABNORMAL LOW (ref 13.0–17.0)
Immature Granulocytes: 0 %
Lymphocytes Relative: 21 %
Lymphs Abs: 1.6 10*3/uL (ref 0.7–4.0)
MCH: 29.3 pg (ref 26.0–34.0)
MCHC: 30.3 g/dL (ref 30.0–36.0)
MCV: 96.7 fL (ref 80.0–100.0)
Monocytes Absolute: 0.9 10*3/uL (ref 0.1–1.0)
Monocytes Relative: 11 %
Neutro Abs: 4.8 10*3/uL (ref 1.7–7.7)
Neutrophils Relative %: 61 %
Platelets: 156 10*3/uL (ref 150–400)
RBC: 3.99 MIL/uL — ABNORMAL LOW (ref 4.22–5.81)
RDW: 14.6 % (ref 11.5–15.5)
WBC: 7.8 10*3/uL (ref 4.0–10.5)
nRBC: 0 % (ref 0.0–0.2)

## 2019-05-28 LAB — COMPREHENSIVE METABOLIC PANEL
ALT: 9 U/L (ref 0–44)
AST: 18 U/L (ref 15–41)
Albumin: 3.1 g/dL — ABNORMAL LOW (ref 3.5–5.0)
Alkaline Phosphatase: 41 U/L (ref 38–126)
Anion gap: 11 (ref 5–15)
BUN: 14 mg/dL (ref 8–23)
CO2: 28 mmol/L (ref 22–32)
Calcium: 8.3 mg/dL — ABNORMAL LOW (ref 8.9–10.3)
Chloride: 98 mmol/L (ref 98–111)
Creatinine, Ser: 1.22 mg/dL (ref 0.61–1.24)
GFR calc Af Amer: >60 mL/min (ref >60)
GFR calc non Af Amer: 56 mL/min — ABNORMAL LOW (ref >60)
Glucose, Bld: 98 mg/dL (ref 70–99)
Potassium: 4 mmol/L (ref 3.5–5.1)
Sodium: 137 mmol/L (ref 135–145)
Total Bilirubin: 1.3 mg/dL — ABNORMAL HIGH (ref 0.3–1.2)
Total Protein: 6.6 g/dL (ref 6.5–8.1)

## 2019-05-28 LAB — PROTIME-INR
INR: 2.7 — ABNORMAL HIGH (ref 0.8–1.2)
Prothrombin Time: 28.1 seconds — ABNORMAL HIGH (ref 11.4–15.2)

## 2019-05-28 LAB — MAGNESIUM: Magnesium: 2.4 mg/dL (ref 1.7–2.4)

## 2019-05-28 MED ORDER — WARFARIN SODIUM 1 MG PO TABS
1.0000 mg | ORAL_TABLET | Freq: Once | ORAL | Status: AC
  Administered 2019-05-28: 1 mg via ORAL
  Filled 2019-05-28: qty 1

## 2019-05-28 MED ORDER — VANCOMYCIN HCL 10 G IV SOLR
1250.0000 mg | INTRAVENOUS | Status: DC
  Filled 2019-05-28: qty 1250

## 2019-05-28 NOTE — Progress Notes (Signed)
ANTICOAGULATION CONSULT NOTE - Follow Up Consult  Pharmacy Consult for Warfarin  Indication: atrial fibrillation  Allergies  Allergen Reactions  . Prednisone Other (See Comments)    Can't take because of heart medication    Patient Measurements: Height: 5\' 11"  (180.3 cm) Weight: 248 lb 9.6 oz (112.8 kg) IBW/kg (Calculated) : 75.3  Vital Signs: Temp: 97.7 F (36.5 C) (08/01 0424) Temp Source: Oral (08/01 0424) BP: 129/68 (08/01 0424) Pulse Rate: 59 (08/01 0424)  Labs: Recent Labs    05/26/19 1706 05/27/19 0257 05/27/19 0328 05/28/19 0402  HGB 12.7* 11.8*  --  11.7*  HCT 41.0 37.1*  --  38.6*  PLT 160 128*  --  156  LABPROT 30.9*  --  29.2* 28.1*  INR 3.0*  --  2.8* 2.7*  CREATININE 1.20 1.00  --  1.22    Estimated Creatinine Clearance: 63.7 mL/min (by C-G formula based on SCr of 1.22 mg/dL).   Medications:  PTA Warfarin 1mg  PO daily - last dose 05/26/2019 at 11am -Home dose confirmed with patient  Assessment: 49 y/oM on warfarin PTA for history of a-fib admitted for cellulitis of right leg. Pharmacy consulted to assist with dosing of warfarin while patient in the hospital. INR therapeutic on admission.  Today, 05/28/19  INR 2.7 remains therapeutic  CBC stable. Hgb slightly low.  No significant drug interactions with warfarin noted. Broad spectrum antibiotics may potentially enhance effects of warfarin  Goal of Therapy:  INR 2-3 Monitor platelets by anticoagulation protocol: Yes   Plan:   Warfarin 1mg  PO x 1 today. Continue home dose.  Daily PT/INR  Monitor closely for s/sx of bleeding   Lenis Noon, PharmD 05/28/2019,8:38 AM

## 2019-05-28 NOTE — Progress Notes (Signed)
Pharmacy Antibiotic Note  Bradley Winters is a 78 y.o. male admitted on 05/26/2019 with cellulitis.  Pharmacy has been consulted for Vancomycin and Zosyn dosing.  Pt has history of chronic lymphedema, recently prescribed antibiotics outpatient with no improvement. Broad spectrum antibiotics started on admission.  Today, 05/28/19  WBC WNL  Afebrile  SCr 1.22, CrCl ~63 mL/min  Day #2 of antibiotics  Plan:  Piperacillin/tazobactam 3.375 g IV q8h EI  Patient currently on vancomycin 1500 mg IV q24h, will decrease to vancomycin 1250 mg IV q12h for estimated AUC of 450  Goal AUC 400-550  Follow renal function and culture data for ability to de-escalate  Vancomycin levels once at steady state as needed  Height: 5\' 11"  (180.3 cm) Weight: 248 lb 9.6 oz (112.8 kg) IBW/kg (Calculated) : 75.3  Temp (24hrs), Avg:97.9 F (36.6 C), Min:97.7 F (36.5 C), Max:98.4 F (36.9 C)  Recent Labs  Lab 05/26/19 1706 05/27/19 0257 05/28/19 0402  WBC 13.1* 9.9 7.8  CREATININE 1.20 1.00 1.22    Estimated Creatinine Clearance: 63.7 mL/min (by C-G formula based on SCr of 1.22 mg/dL).    Allergies  Allergen Reactions  . Prednisone Other (See Comments)    Can't take because of heart medication    Antimicrobials this admission: Vancomycin 05/26/2019 >> Zosyn 05/27/2019 >>   Dose adjustments this admission: 8/1 vancomycin 1500 q24h --> 1250 mg IV q24h  Microbiology results: 7/31 Blood: Pending 7/30 SARS-2: Negative  Thank you for allowing pharmacy to be a part of this patient's care.  Lenis Noon, PharmD 05/28/2019 8:29 AM

## 2019-05-28 NOTE — Progress Notes (Signed)
PROGRESS NOTE  Bradley Winters SWF:[id number] DOB: 01-24-1941   PCP: Everardo Beals, NP  Patient is from: Home  DOA: 05/26/2019 LOS: 2  Brief Narrative / Interim history: Pt. with PMH of chronic stasis edema, dermatitis, A. fib, anxiety, bladder cancer, HTN, thrombocytopenia; presented with complaint of worsening swelling, was found to have cellulitis of right leg.  Admitted on 05/26/2019 right lower extremity cellulitis after failing outpatient antibiotic.  Subjective: No major events overnight of this morning.  Redness improved.  Still with significant swelling.  No significant pain.  Denies fever, chills, chest pain, shortness of breath, GI or GU symptoms.  Objective: Vitals:   05/27/19 0424 05/27/19 1422 05/27/19 2051 05/28/19 0424  BP: (!) 109/59 125/81 115/73 129/68  Pulse: 78 84 65 (!) 59  Resp: 18 17 16 18   Temp: 98.5 F (36.9 C) 97.7 F (36.5 C) 98.4 F (36.9 C) 97.7 F (36.5 C)  TempSrc: Oral  Oral Oral  SpO2: 93% 97% 96% 94%  Weight:      Height:        Intake/Output Summary (Last 24 hours) at 05/28/2019 1255 Last data filed at 05/28/2019 0732 Gross per 24 hour  Intake 1667.96 ml  Output 2100 ml  Net -432.04 ml   Filed Weights   05/26/19 1344  Weight: 112.8 kg    Examination:  GENERAL: No acute distress.  Appears well.  HEENT: MMM.  Vision and hearing grossly intact.  NECK: Supple.  No apparent JVD.  RESP:  No IWOB. Good air movement bilaterally. CVS:  RRR. Heart sounds normal.  ABD/GI/GU: Bowel sounds present. Soft. Non tender.  MSK/EXT: Moves extremities.  Lymphedema/venous stasis right > left.  Erythema and increased warmth to touch in RLE. SKIN: As above. NEURO: Awake, alert and oriented appropriately.  No gross deficit.  PSYCH: Calm. Normal affect.   I have personally reviewed the following labs and images:  Radiology Studies: No results found.  Microbiology: Recent Results (from the past 240 hour(s))  SARS Coronavirus 2 (CEPHEID -  Performed in [place] hospital lab), Hosp Order     Status: None   Collection Time: 05/26/19  6:13 PM   Specimen: Nasopharyngeal Swab  Result Value Ref Range Status   SARS Coronavirus 2 NEGATIVE NEGATIVE Final    Comment: (NOTE) If result is NEGATIVE SARS-CoV-2 target nucleic acids are NOT DETECTED. The SARS-CoV-2 RNA is generally detectable in upper and lower  respiratory specimens during the acute phase of infection. The lowest  concentration of SARS-CoV-2 viral copies this assay can detect is 250  copies / mL. A negative result does not preclude SARS-CoV-2 infection  and should not be used as the sole basis for treatment or other  patient management decisions.  A negative result may occur with  improper specimen collection / handling, submission of specimen other  than nasopharyngeal swab, presence of viral mutation(s) within the  areas targeted by this assay, and inadequate number of viral copies  (<250 copies / mL). A negative result must be combined with clinical  observations, patient history, and epidemiological information. If result is POSITIVE SARS-CoV-2 target nucleic acids are DETECTED. The SARS-CoV-2 RNA is generally detectable in upper and lower  respiratory specimens dur ing the acute phase of infection.  Positive  results are indicative of active infection with SARS-CoV-2.  Clinical  correlation with patient history and other diagnostic information is  necessary to determine patient infection status.  Positive results do  not rule out bacterial infection or co-infection with other  viruses. If result is PRESUMPTIVE POSTIVE SARS-CoV-2 nucleic acids MAY BE PRESENT.   A presumptive positive result was obtained on the submitted specimen  and confirmed on repeat testing.  While 2019 novel coronavirus  (SARS-CoV-2) nucleic acids may be present in the submitted sample  additional confirmatory testing may be necessary for epidemiological  and / or clinical management  purposes  to differentiate between  SARS-CoV-2 and other Sarbecovirus currently known to infect humans.  If clinically indicated additional testing with an alternate test  methodology [id number]) is advised. The SARS-CoV-2 RNA is generally  detectable in upper and lower respiratory sp ecimens during the acute  phase of infection. The expected result is Negative. Fact Sheet for Patients:  StrictlyIdeas.no Fact Sheet for Healthcare Providers: BankingDealers.co.za This test is not yet approved or cleared by the [place] FDA and has been authorized for detection and/or diagnosis of SARS-CoV-2 by FDA under an Emergency Use Authorization (EUA).  This EUA will remain in effect (meaning this test can be used) for the duration of the COVID-19 declaration under Section 564(b)(1) of the Act, 21 U.S.C. section 360bbb-3(b)(1), unless the authorization is terminated or revoked sooner. Performed at [facility], [place] [address], [place], [place] [address]   Culture, blood (single)     Status: None (Preliminary result)   Collection Time: 05/27/19 10:15 AM   Specimen: BLOOD  Result Value Ref Range Status   Specimen Description   Final    BLOOD LEFT ARM Performed at [facility] [address] [place], [place] [address]    Special Requests   Final    BOTTLES DRAWN AEROBIC AND ANAEROBIC Blood Culture adequate volume Performed at [facility] [address] [place], [place] [address]    Culture   Final    NO GROWTH < 24 HOURS Performed at [facility] [address], [place], [place] [address]    Report Status PENDING  Incomplete    Sepsis Labs: Invalid input(s): PROCALCITONIN, LACTICIDVEN  Urine analysis:    Component Value Date/Time   COLORURINE LT. YELLOW 06/14/2010 1135   APPEARANCEUR CLEAR 06/14/2010 1135   LABSPEC 1.015 06/14/2010 1135   PHURINE 5.5 06/14/2010 1135    GLUCOSEU NEGATIVE 06/14/2010 1135   BILIRUBINUR NEGATIVE 06/14/2010 1135   KETONESUR NEGATIVE 06/14/2010 1135   UROBILINOGEN 0.2 06/14/2010 1135   NITRITE NEGATIVE 06/14/2010 1135   LEUKOCYTESUR NEGATIVE 06/14/2010 1135    Anemia Panel: No results for input(s): VITAMINB12, FOLATE, FERRITIN, TIBC, IRON, RETICCTPCT in the last 72 hours.  Thyroid Function Tests: No results for input(s): TSH, T4TOTAL, FREET4, T3FREE, THYROIDAB in the last 72 hours.  Lipid Profile: No results for input(s): CHOL, HDL, LDLCALC, TRIG, CHOLHDL, LDLDIRECT in the last 72 hours.  CBG: No results for input(s): GLUCAP in the last 168 hours.  HbA1C: No results for input(s): HGBA1C in the last 72 hours.  BNP (last 3 results): No results for input(s): PROBNP in the last 8760 hours.  Cardiac Enzymes: No results for input(s): CKTOTAL, CKMB, CKMBINDEX, TROPONINI in the last 168 hours.  Coagulation Profile: Recent Labs  Lab 05/26/19 1706 05/27/19 0328 05/28/19 0402  INR 3.0* 2.8* 2.7*    Liver Function Tests: Recent Labs  Lab 05/26/19 1706 05/27/19 0257 05/28/19 0402  AST 17 14* 18  ALT 11 12 9   ALKPHOS 52 43 41  BILITOT 1.2 1.0 1.3*  PROT 7.6 6.5 6.6  ALBUMIN 3.7 3.1* 3.1*   No results for input(s): LIPASE, AMYLASE  in the last 168 hours. No results for input(s): AMMONIA in the last 168 hours.  Basic Metabolic Panel: Recent Labs  Lab 05/26/19 1706 05/27/19 0257 05/28/19 0402  NA 138 137 137  K 3.9 3.5 4.0  CL 97* 100 98  CO2 30 28 28   GLUCOSE 107* 108* 98  BUN 18 16 14   CREATININE 1.20 1.00 1.22  CALCIUM 8.9 8.5* 8.3*  MG  --   --  2.4   GFR: Estimated Creatinine Clearance: 63.7 mL/min (by C-G formula based on SCr of 1.22 mg/dL).  CBC: Recent Labs  Lab 05/26/19 1706 05/27/19 0257 05/28/19 0402  WBC 13.1* 9.9 7.8  NEUTROABS 9.5*  --  4.8  HGB 12.7* 11.8* 11.7*  HCT 41.0 37.1* 38.6*  MCV 96.0 95.9 96.7  PLT 160 128* 156    Procedures:  None  Microbiology summarized:  [id number] negative. Blood cultures negative so far.  Assessment & Plan: Right lower extremity cellulitis with underlying chronic right lower extremity lymphedema/venous stasis-improving. -Failed outpatient antibiotic with oral Keflex. -Blood cultures negative.  Leukocytosis resolved. -Discontinue vancomycin -Continue Zosyn -Continue home Lasix and spironolactone. -We will resume compression stocking when infections get better. -Encourage lower extremity elevation.  Chronic A. Fib: Able.  -Continue home regimen-Cardizem, metoprolol and warfarin -INR therapeutic.  Essential hypertension: Stable. -Continue home regimen as above.  Obesity: BMI 34.67. -Encourage lifestyle change to lose weight  BPH: Continue home Proscar  DVT prophylaxis: On warfarin for anticoagulation Code Status: Full code Family Communication: Patient and/or RN. Available if any question. Disposition Plan: Remains inpatient for cellulitis.  Still with significant erythema increased warmth to touch. Consultants: None   Antimicrobials: Anti-infectives (From admission, onward)   Start     Dose/Rate Route Frequency Ordered Stop   05/28/19 2200  vancomycin (VANCOCIN) 1,250 mg in sodium chloride 0.9 % 250 mL IVPB  Status:  Discontinued     1,250 mg 166.7 mL/hr over 90 Minutes Intravenous Every 24 hours 05/28/19 0828 05/28/19 1254   05/27/19 2200  vancomycin (VANCOCIN) 1,500 mg in sodium chloride 0.9 % 500 mL IVPB  Status:  Discontinued     1,500 mg 250 mL/hr over 120 Minutes Intravenous Every 24 hours 05/27/19 0314 05/28/19 0828   05/27/19 1000  doxycycline (VIBRA-TABS) tablet 100 mg  Status:  Discontinued     100 mg Oral 2 times daily 05/26/19 2345 05/27/19 0941   05/27/19 0600  piperacillin-tazobactam (ZOSYN) IVPB 3.375 g     3.375 g 12.5 mL/hr over 240 Minutes Intravenous Every 8 hours 05/27/19 0311     05/26/19 2230  vancomycin (VANCOCIN) IVPB 1000 mg/200 mL premix     1,000 mg 200 mL/hr over 60 Minutes  Intravenous  Once 05/26/19 2222 05/27/19 0102   05/26/19 2230  piperacillin-tazobactam (ZOSYN) IVPB 3.375 g     3.375 g 100 mL/hr over 30 Minutes Intravenous  Once 05/26/19 2222 05/27/19 0155   05/26/19 1715  vancomycin (VANCOCIN) IVPB 1000 mg/200 mL premix     1,000 mg 200 mL/hr over 60 Minutes Intravenous  Once 05/26/19 1712 05/26/19 1905      Sch Meds:  Scheduled Meds: . diltiazem  360 mg Oral Daily  . finasteride  1 mg Oral Daily  . fluticasone  1 spray Each Nare Daily  . furosemide  40 mg Oral BID  . loratadine  10 mg Oral Daily  . metoprolol succinate  25 mg Oral Daily  . mupirocin ointment  1 application Topical Daily  . potassium chloride SA  20 mEq Oral BID  . spironolactone  25 mg Oral Daily  . warfarin  1 mg Oral ONCE-1800  . Warfarin - Pharmacist Dosing Inpatient   Does not apply q1800   Continuous Infusions: . sodium chloride 1,000 mL (05/26/19 1802)  . piperacillin-tazobactam (ZOSYN)  IV 3.375 g (05/28/19 0502)   PRN Meds:.sodium chloride, acetaminophen, ondansetron **OR** ondansetron (ZOFRAN) IV    T[place]  If 7PM-7AM, please contact night-coverage [email or web address] Password [facility] 05/28/2019, 12:55 PM

## 2019-05-29 LAB — PROTIME-INR
INR: 2.6 — ABNORMAL HIGH (ref 0.8–1.2)
Prothrombin Time: 27.2 seconds — ABNORMAL HIGH (ref 11.4–15.2)

## 2019-05-29 MED ORDER — WARFARIN SODIUM 1 MG PO TABS
1.0000 mg | ORAL_TABLET | Freq: Every day | ORAL | Status: DC
  Administered 2019-05-29: 1 mg via ORAL
  Filled 2019-05-29: qty 1

## 2019-05-29 MED ORDER — AMOXICILLIN-POT CLAVULANATE 875-125 MG PO TABS
1.0000 | ORAL_TABLET | Freq: Two times a day (BID) | ORAL | Status: DC
  Administered 2019-05-29 – 2019-05-30 (×3): 1 via ORAL
  Filled 2019-05-29 (×3): qty 1

## 2019-05-29 NOTE — Evaluation (Signed)
Occupational Therapy Evaluation Patient Details Name: Bradley Winters MRN: [id number] DOB: 1940/12/20 Today's Date: 05/29/2019    History of Present Illness Pt. is a 78 y/o male with PMH of chronic stasis edema, dermatitis, A. fib, anxiety, bladder cancer, HTN, thrombocytopenia; presented with complaint of worsening swelling, was found to have cellulitis of right leg.   Clinical Impression   PTA Pt  Mostly independent in ADL and mobility. The only thing that he was needing help with was dressing his RLE Today Pt able to demonstrate transfers, toileting, sink level grooming and overall ADL at independent level. Pt educated in [place] for LB dressing, and would benefit from skilled OT in the acute setting with additional session to focus on AE education as Pt would like to be completely independent in LB dressing. Please demo AE kit next session.     Follow Up Recommendations  No OT follow up    Equipment Recommendations  Other (comment)(potentially AE for dressing RLE)    Recommendations for Other Services       Precautions / Restrictions Restrictions Weight Bearing Restrictions: No      Mobility Bed Mobility Overal bed mobility: Modified Independent             General bed mobility comments: increased time  Transfers Overall transfer level: Modified independent Equipment used: None                  Balance Overall balance assessment: No apparent balance deficits (not formally assessed)                                         ADL either performed or assessed with clinical judgement   ADL Overall ADL's : Needs assistance/impaired Eating/Feeding: Independent   Grooming: Modified independent   Upper Body Bathing: Modified independent   Lower Body Bathing: Minimal assistance;Sitting/lateral leans Lower Body Bathing Details (indicate cue type and reason): RLE is hard and "right now they dont want me getting it wet" Upper Body Dressing :  Independent   Lower Body Dressing: Moderate assistance;Sit to/from stand Lower Body Dressing Details (indicate cue type and reason): pt will benefit from AE education for LB dressing Toilet Transfer: Modified Independent   Toileting- Clothing Manipulation and Hygiene: Independent   Tub/ Shower Transfer: Tub transfer;Modified independent;Ambulation   Functional mobility during ADLs: Modified independent General ADL Comments: only struggle is access to RLE for dressing - has been needding assist from wife. will benefit from AE education     Vision Baseline Vision/History: Wears glasses Wears Glasses: At all times Patient Visual Report: No change from baseline       Perception     Praxis      Pertinent Vitals/Pain Pain Assessment: Faces Faces Pain Scale: Hurts a little bit Pain Location: RLE Pain Descriptors / Indicators: Dull;Discomfort Pain Intervention(s): Limited activity within patient's tolerance;Monitored during session;Repositioned;Other (comment)(elevation at end of session)     Hand Dominance     Extremity/Trunk Assessment Upper Extremity Assessment Upper Extremity Assessment: Overall WFL for tasks assessed   Lower Extremity Assessment Lower Extremity Assessment: RLE deficits/detail(edema noted BLE) RLE Deficits / Details: edema   Cervical / Trunk Assessment Cervical / Trunk Assessment: Normal   Communication Communication Communication: No difficulties   Cognition Arousal/Alertness: Awake/alert Behavior During Therapy: WFL for tasks assessed/performed Overall Cognitive Status: Within Functional Limits for tasks assessed  General Comments       Exercises     Shoulder Instructions      Home Living Family/patient expects to be discharged to:: Private residence Living Arrangements: Spouse/significant other Available Help at Discharge: Family;Available 24 hours/day Type of Home: House Home Access:  Stairs to enter [employer] of Steps: 7 Entrance Stairs-Rails: Right;Left;Can reach both Home Layout: One level     Bathroom Shower/Tub: Tub/shower unit;Walk-in shower   Bathroom Toilet: Standard     Home Equipment: Cane - single point;Shower seat;Shower seat - built in;Grab bars - toilet;Grab bars - tub/shower;Hand held shower head          Prior Functioning/Environment Level of Independence: Independent        Comments: enjoys gardening, used to do war re-enactments,         OT Problem List: Decreased range of motion;Decreased knowledge of use of DME or AE;Increased edema;Pain      OT Treatment/Interventions: Self-care/ADL training;DME and/or AE instruction;Therapeutic activities;Patient/family education    OT Goals(Current goals can be found in the care plan section) Acute Rehab OT Goals Patient Stated Goal: be completely independent and get this RLE swelling down OT Goal Formulation: With patient Time For Goal Achievement: 06/12/19 Potential to Achieve Goals: Good ADL Goals Pt Will Perform Lower Body Bathing: with modified independence;with adaptive equipment;sit to/from stand Pt Will Perform Lower Body Dressing: with modified independence;with adaptive equipment;sit to/from stand  OT Frequency: Min 2X/week   Barriers to D/C:            Co-evaluation              AM-PAC OT "6 Clicks" Daily Activity     Outcome Measure Help from another person eating meals?: None Help from another person taking care of personal grooming?: None Help from another person toileting, which includes using toliet, bedpan, or urinal?: None Help from another person bathing (including washing, rinsing, drying)?: A Little Help from another person to put on and taking off regular upper body clothing?: None Help from another person to put on and taking off regular lower body clothing?: A Lot 6 Click Score: 21   End of Session Nurse Communication: Mobility  status  Activity Tolerance: Patient tolerated treatment well Patient left: in bed;with call bell/phone within reach  OT Visit Diagnosis: Pain Pain - Right/Left: Right Pain - part of body: Leg                Time: 7092-9574 OT Time Calculation (min): 44 min Charges:  OT General Charges $OT Visit: 1 Visit OT Evaluation $OT Eval Low Complexity: 1 Low OT Treatments $Self Care/Home Management : 8-22 mins $Therapeutic Activity: 8-22 mins  Hulda Humphrey OTR/L Acute Rehabilitation Services Pager: [phone] Office: [place] 05/29/2019, 1:24 PM

## 2019-05-29 NOTE — Plan of Care (Signed)

## 2019-05-29 NOTE — Progress Notes (Signed)
ANTICOAGULATION CONSULT NOTE - Follow Up Consult  Pharmacy Consult for Warfarin  Indication: atrial fibrillation  Allergies  Allergen Reactions  . Prednisone Other (See Comments)    Can't take because of heart medication    Patient Measurements: Height: 5\' 11"  (180.3 cm) Weight: 248 lb 9.6 oz (112.8 kg) IBW/kg (Calculated) : 75.3  Vital Signs: Temp: 97.5 F (36.4 C) (08/02 0611) Temp Source: Oral (08/02 0611) BP: 115/70 (08/02 0611) Pulse Rate: 68 (08/02 0611)  Labs: Recent Labs    05/26/19 1706 05/27/19 0257 05/27/19 0328 05/28/19 0402 05/29/19 0342  HGB 12.7* 11.8*  --  11.7*  --   HCT 41.0 37.1*  --  38.6*  --   PLT 160 128*  --  156  --   LABPROT 30.9*  --  29.2* 28.1* 27.2*  INR 3.0*  --  2.8* 2.7* 2.6*  CREATININE 1.20 1.00  --  1.22  --     Estimated Creatinine Clearance: 63.7 mL/min (by C-G formula based on SCr of 1.22 mg/dL).   Medications:  PTA Warfarin 1mg  PO daily - last dose 05/26/2019 at 11am -Home dose confirmed with patient  Assessment: 20 y/oM on warfarin PTA for history of a-fib admitted for cellulitis of right leg. Pharmacy consulted to assist with dosing of warfarin while patient in the hospital. INR therapeutic on admission.  Today, 05/29/19  INR 2.6 remains therapeutic  CBC stable. Hgb slightly low.  No significant drug interactions with warfarin noted. Broad spectrum antibiotics may potentially enhance effects of warfarin  Goal of Therapy:  INR 2-3 Monitor platelets by anticoagulation protocol: Yes   Plan:   Warfarin 1mg  PO daily. Continue home dose.  Daily PT/INR  Monitor closely for s/sx of bleeding   Gretta Arab PharmD, BCPS Clinical pharmacist phone 7am- 5pm: [phone] 05/29/2019 10:52 AM

## 2019-05-29 NOTE — Progress Notes (Signed)
PROGRESS NOTE  Bradley Winters GYI:[id number] DOB: [birth year]   PCP: Everardo Beals, NP  Patient is from: Home  DOA: 05/26/2019 LOS: 3  Brief Narrative / Interim history: Pt. with PMH of chronic stasis edema, dermatitis, A. fib, anxiety, bladder cancer, HTN, thrombocytopenia; presented with complaint of worsening swelling, was found to have cellulitis of right leg.  Admitted on 05/26/2019 right lower extremity cellulitis after failing outpatient antibiotic.  Subjective: No major events overnight of this morning.  Swelling or redness improving.  Has no complaint this morning.  He is wondering if he can get a recliner chair to help him elevate his legs.   Objective: Vitals:   05/28/19 0424 05/28/19 1322 05/28/19 2146 05/29/19 0611  BP: 129/68 118/65 127/61 115/70  Pulse: (!) 59 (!) 57 76 68  Resp: 18 15 18 17   Temp: 97.7 F (36.5 C) 98 F (36.7 C) 98.4 F (36.9 C) (!) 97.5 F (36.4 C)  TempSrc: Oral Oral Oral Oral  SpO2: 94% 98% 100% 91%  Weight:      Height:        Intake/Output Summary (Last 24 hours) at 05/29/2019 1109 Last data filed at 05/29/2019 0902 Gross per 24 hour  Intake 918.89 ml  Output 1350 ml  Net -431.11 ml   Filed Weights   05/26/19 1344  Weight: 112.8 kg    Examination:   GENERAL: No acute distress.  Appears well.  HEENT: MMM.  Vision and hearing grossly intact.  NECK: Supple.  No apparent JVD.  RESP:  No IWOB. Good air movement bilaterally. CVS:  RRR. Heart sounds normal.  ABD/GI/GU: Bowel sounds present. Soft. Non tender.  MSK/EXT: Ambulating in the room.  Lymphedema and venous stasis in both legs, right> left. SKIN: Venous stasis in both lower extremities NEURO: Awake, alert and oriented appropriately.  No gross deficit.  PSYCH: Calm. Normal affect.  05/29/2019      05/26/2019      I have personally reviewed the following labs and images:  Radiology Studies: No results found.  Microbiology: Recent Results (from the past 240  hour(s))  SARS Coronavirus 2 (CEPHEID - Performed in [facility] hospital lab), Hosp Order     Status: None   Collection Time: 05/26/19  6:13 PM   Specimen: Nasopharyngeal Swab  Result Value Ref Range Status   SARS Coronavirus 2 NEGATIVE NEGATIVE Final    Comment: (NOTE) If result is NEGATIVE SARS-CoV-2 target nucleic acids are NOT DETECTED. The SARS-CoV-2 RNA is generally detectable in upper and lower  respiratory specimens during the acute phase of infection. The lowest  concentration of SARS-CoV-2 viral copies this assay can detect is 250  copies / mL. A negative result does not preclude SARS-CoV-2 infection  and should not be used as the sole basis for treatment or other  patient management decisions.  A negative result may occur with  improper specimen collection / handling, submission of specimen other  than nasopharyngeal swab, presence of viral mutation(s) within the  areas targeted by this assay, and inadequate number of viral copies  (<250 copies / mL). A negative result must be combined with clinical  observations, patient history, and epidemiological information. If result is POSITIVE SARS-CoV-2 target nucleic acids are DETECTED. The SARS-CoV-2 RNA is generally detectable in upper and lower  respiratory specimens dur ing the acute phase of infection.  Positive  results are indicative of active infection with SARS-CoV-2.  Clinical  correlation with patient history and other diagnostic information is  necessary to  determine patient infection status.  Positive results do  not rule out bacterial infection or co-infection with other viruses. If result is PRESUMPTIVE POSTIVE SARS-CoV-2 nucleic acids MAY BE PRESENT.   A presumptive positive result was obtained on the submitted specimen  and confirmed on repeat testing.  While 2019 novel coronavirus  (SARS-CoV-2) nucleic acids may be present in the submitted sample  additional confirmatory testing may be necessary for  epidemiological  and / or clinical management purposes  to differentiate between  SARS-CoV-2 and other Sarbecovirus currently known to infect humans.  If clinically indicated additional testing with an alternate test  methodology [phone]) is advised. The SARS-CoV-2 RNA is generally  detectable in upper and lower respiratory sp ecimens during the acute  phase of infection. The expected result is Negative. Fact Sheet for Patients:  StrictlyIdeas.no Fact Sheet for Healthcare Providers: BankingDealers.co.za This test is not yet approved or cleared by the [place] FDA and has been authorized for detection and/or diagnosis of SARS-CoV-2 by FDA under an Emergency Use Authorization (EUA).  This EUA will remain in effect (meaning this test can be used) for the duration of the COVID-19 declaration under Section 564(b)(1) of the Act, 21 U.S.C. section 360bbb-3(b)(1), unless the authorization is terminated or revoked sooner. Performed at [facility], [place] [address] [place], [place] [address]   Culture, blood (single)     Status: None (Preliminary result)   Collection Time: 05/27/19 10:15 AM   Specimen: BLOOD  Result Value Ref Range Status   Specimen Description   Final    BLOOD LEFT ARM Performed at [facility] [address], [place], [place] [address]    Special Requests   Final    BOTTLES DRAWN AEROBIC AND ANAEROBIC Blood Culture adequate volume Performed at [facility] [address] [place], [place] [address]    Culture   Final    NO GROWTH 2 DAYS Performed at [facility] [address], [place], [place] [address]    Report Status PENDING  Incomplete    Sepsis Labs: Invalid input(s): PROCALCITONIN, LACTICIDVEN  Urine analysis:    Component Value Date/Time   COLORURINE LT. YELLOW 06/14/2010 1135   APPEARANCEUR CLEAR 06/14/2010 1135   LABSPEC 1.015  06/14/2010 1135   PHURINE 5.5 06/14/2010 1135   GLUCOSEU NEGATIVE 06/14/2010 1135   BILIRUBINUR NEGATIVE 06/14/2010 1135   KETONESUR NEGATIVE 06/14/2010 1135   UROBILINOGEN 0.2 06/14/2010 1135   NITRITE NEGATIVE 06/14/2010 1135   LEUKOCYTESUR NEGATIVE 06/14/2010 1135    Anemia Panel: No results for input(s): VITAMINB12, FOLATE, FERRITIN, TIBC, IRON, RETICCTPCT in the last 72 hours.  Thyroid Function Tests: No results for input(s): TSH, T4TOTAL, FREET4, T3FREE, THYROIDAB in the last 72 hours.  Lipid Profile: No results for input(s): CHOL, HDL, LDLCALC, TRIG, CHOLHDL, LDLDIRECT in the last 72 hours.  CBG: No results for input(s): GLUCAP in the last 168 hours.  HbA1C: No results for input(s): HGBA1C in the last 72 hours.  BNP (last 3 results): No results for input(s): PROBNP in the last 8760 hours.  Cardiac Enzymes: No results for input(s): CKTOTAL, CKMB, CKMBINDEX, TROPONINI in the last 168 hours.  Coagulation Profile: Recent Labs  Lab 05/26/19 1706 05/27/19 0328 05/28/19 0402 05/29/19 0342  INR 3.0* 2.8* 2.7* 2.6*    Liver Function Tests: Recent Labs  Lab 05/26/19 1706 05/27/19 0257 05/28/19 0402  AST 17 14* 18  ALT 11 12 9   ALKPHOS 52 43 41  BILITOT 1.2  1.0 1.3*  PROT 7.6 6.5 6.6  ALBUMIN 3.7 3.1* 3.1*   No results for input(s): LIPASE, AMYLASE in the last 168 hours. No results for input(s): AMMONIA in the last 168 hours.  Basic Metabolic Panel: Recent Labs  Lab 05/26/19 1706 05/27/19 0257 05/28/19 0402  NA 138 137 137  K 3.9 3.5 4.0  CL 97* 100 98  CO2 30 28 28   GLUCOSE 107* 108* 98  BUN 18 16 14   CREATININE 1.20 1.00 1.22  CALCIUM 8.9 8.5* 8.3*  MG  --   --  2.4   GFR: Estimated Creatinine Clearance: 63.7 mL/min (by C-G formula based on SCr of 1.22 mg/dL).  CBC: Recent Labs  Lab 05/26/19 1706 05/27/19 0257 05/28/19 0402  WBC 13.1* 9.9 7.8  NEUTROABS 9.5*  --  4.8  HGB 12.7* 11.8* 11.7*  HCT 41.0 37.1* 38.6*  MCV 96.0 95.9 96.7   PLT 160 128* 156    Procedures:  None  Microbiology summarized: [id number] negative. Blood cultures negative so far.  Assessment & Plan: Right lower extremity cellulitis with underlying chronic right lower extremity lymphedema/venous stasis-improving. -Failed outpatient antibiotic with oral Keflex. -Blood cultures negative.  Leukocytosis resolved. -Discontinued vancomycin 7/30-8/1 -Zosyn 7/30-8/2  -Augmentin 8/2- -Continue home Lasix and spironolactone. -We will resume compression stocking when infections get better. -Encourage lower extremity elevation. -PT eval  Chronic A. Fib: Stable. -Continue home regimen-Cardizem, metoprolol and warfarin -INR therapeutic.  Essential hypertension: Stable. -Continue home regimen as above.  Obesity: BMI 34.67. -Encourage lifestyle change to lose weight  BPH: Continue home Proscar  DVT prophylaxis: On warfarin for anticoagulation Code Status: Full code Family Communication: Patient and/or RN. Available if any question. Disposition Plan: Anticipate discharge on 8/3 on oral Augmentin. Consultants: None   Antimicrobials: Anti-infectives (From admission, onward)   Start     Dose/Rate Route Frequency Ordered Stop   05/29/19 1200  amoxicillin-clavulanate (AUGMENTIN) 875-125 MG per tablet 1 tablet     1 tablet Oral Every 12 hours 05/29/19 1104     05/28/19 2200  vancomycin (VANCOCIN) 1,250 mg in sodium chloride 0.9 % 250 mL IVPB  Status:  Discontinued     1,250 mg 166.7 mL/hr over 90 Minutes Intravenous Every 24 hours 05/28/19 0828 05/28/19 1254   05/27/19 2200  vancomycin (VANCOCIN) 1,500 mg in sodium chloride 0.9 % 500 mL IVPB  Status:  Discontinued     1,500 mg 250 mL/hr over 120 Minutes Intravenous Every 24 hours 05/27/19 0314 05/28/19 0828   05/27/19 1000  doxycycline (VIBRA-TABS) tablet 100 mg  Status:  Discontinued     100 mg Oral 2 times daily 05/26/19 2345 05/27/19 0941   05/27/19 0600  piperacillin-tazobactam (ZOSYN) IVPB  3.375 g  Status:  Discontinued     3.375 g 12.5 mL/hr over 240 Minutes Intravenous Every 8 hours 05/27/19 0311 05/29/19 1104   05/26/19 2230  vancomycin (VANCOCIN) IVPB 1000 mg/200 mL premix     1,000 mg 200 mL/hr over 60 Minutes Intravenous  Once 05/26/19 2222 05/27/19 0102   05/26/19 2230  piperacillin-tazobactam (ZOSYN) IVPB 3.375 g     3.375 g 100 mL/hr over 30 Minutes Intravenous  Once 05/26/19 2222 05/27/19 0155   05/26/19 1715  vancomycin (VANCOCIN) IVPB 1000 mg/200 mL premix     1,000 mg 200 mL/hr over 60 Minutes Intravenous  Once 05/26/19 1712 05/26/19 1905      Sch Meds:  Scheduled Meds: . amoxicillin-clavulanate  1 tablet Oral Q12H  . diltiazem  360 mg Oral  Daily  . finasteride  1 mg Oral Daily  . fluticasone  1 spray Each Nare Daily  . furosemide  40 mg Oral BID  . loratadine  10 mg Oral Daily  . metoprolol succinate  25 mg Oral Daily  . mupirocin ointment  1 application Topical Daily  . potassium chloride SA  20 mEq Oral BID  . spironolactone  25 mg Oral Daily  . warfarin  1 mg Oral q1800  . Warfarin - Pharmacist Dosing Inpatient   Does not apply q1800   Continuous Infusions: . sodium chloride 1,000 mL (05/26/19 1802)   PRN Meds:.sodium chloride, acetaminophen, ondansetron **OR** ondansetron (ZOFRAN) IV   [facility]  If 7PM-7AM, please contact night-coverage [email or web address] Password [email or web address] 05/29/2019, 11:09 AM

## 2019-05-29 NOTE — Evaluation (Signed)
Physical Therapy Evaluation Patient Details Name: Bradley Winters MRN: [id number] DOB: 1941/05/28 Today's Date: 05/29/2019   History of Present Illness  Pt. is a 78 y/o male with PMH of chronic stasis edema, dermatitis, A. fib, anxiety, bladder cancer, HTN, thrombocytopenia; presented with complaint of worsening swelling, was found to have cellulitis of right leg.  Clinical Impression  Pt admitted as above and currently able to demonstrate IND with performance of all mobility tasks.  Pt ambulating in halls, fwd sideways and bkwds as well as negotiating stairs without assist.  Pt states close to baseline level of function with better control of LE edema. Pt with no current PT needs and will be dc from PT service.Debe Coder PT Acute Rehabilitation Services Pager [phone] Office [phone]     Follow Up Recommendations No PT follow up    Equipment Recommendations  None recommended by PT    Recommendations for Other Services       Precautions / Restrictions Restrictions Weight Bearing Restrictions: No      Mobility  Bed Mobility Overal bed mobility: Modified Independent             General bed mobility comments: increased time  Transfers Overall transfer level: Modified independent Equipment used: None                Ambulation/Gait Ambulation/Gait assistance: Supervision;Independent Gait Distance (Feet): 600 Feet Assistive device: None Gait Pattern/deviations: Step-through pattern;Shuffle;Trunk flexed;Wide base of support Gait velocity: moderate pace   General Gait Details: mild instability with mildly antalgic gait but no LOB  Stairs Stairs: Yes Stairs assistance: Min guard;Supervision Stair Management: Two rails;Alternating pattern;Forwards Number of Stairs: 4    Wheelchair Mobility    Modified Rankin (Stroke Patients Only)       Balance Overall balance assessment: Mild deficits observed, not formally tested                                            Pertinent Vitals/Pain Pain Assessment: Faces Faces Pain Scale: Hurts a little bit Pain Location: RLE Pain Descriptors / Indicators: Dull;Discomfort Pain Intervention(s): Limited activity within patient's tolerance;Monitored during session    Home Living Family/patient expects to be discharged to:: Private residence Living Arrangements: Spouse/significant other Available Help at Discharge: Family;Available 24 hours/day Type of Home: House Home Access: Stairs to enter Entrance Stairs-Rails: Right;Left;Can reach both Entrance Stairs-Number of Steps: 7 Home Layout: One level Home Equipment: Cane - single point;Shower seat;Shower seat - built in;Grab bars - toilet;Grab bars - tub/shower;Hand held shower head      Prior Function Level of Independence: Independent         Comments: enjoys gardening, used to do war re-enactments,      Hand Dominance        Extremity/Trunk Assessment   Upper Extremity Assessment Upper Extremity Assessment: Overall WFL for tasks assessed    Lower Extremity Assessment Lower Extremity Assessment: Overall WFL for tasks assessed;RLE deficits/detail RLE Deficits / Details: edema    Cervical / Trunk Assessment Cervical / Trunk Assessment: Normal  Communication   Communication: No difficulties  Cognition Arousal/Alertness: Awake/alert Behavior During Therapy: WFL for tasks assessed/performed Overall Cognitive Status: Within Functional Limits for tasks assessed  General Comments      Exercises     Assessment/Plan    PT Assessment Patent does not need any further PT services  PT Problem List         PT Treatment Interventions      PT Goals (Current goals can be found in the Care Plan section)  Acute Rehab PT Goals Patient Stated Goal: be completely independent and get this RLE swelling down PT Goal Formulation: All assessment and  education complete, DC therapy    Frequency     Barriers to discharge        Co-evaluation               AM-PAC PT "6 Clicks" Mobility  Outcome Measure Help needed turning from your back to your side while in a flat bed without using bedrails?: None Help needed moving from lying on your back to sitting on the side of a flat bed without using bedrails?: None Help needed moving to and from a bed to a chair (including a wheelchair)?: None Help needed standing up from a chair using your arms (e.g., wheelchair or bedside chair)?: None Help needed to walk in hospital room?: None Help needed climbing 3-5 steps with a railing? : A Little 6 Click Score: 23    End of Session Equipment Utilized During Treatment: Gait belt Activity Tolerance: Patient tolerated treatment well Patient left: in chair;with call bell/phone within reach Nurse Communication: Mobility status PT Visit Diagnosis: Difficulty in walking, not elsewhere classified (R26.2)    Time: 0932-6712 PT Time Calculation (min) (ACUTE ONLY): 29 min   Charges:   PT Evaluation $PT Eval Low Complexity: [place] Pager [phone] Office [phone]   Shahrukh Pasch 05/29/2019, 4:53 PM

## 2019-05-30 LAB — CBC
HCT: 41.2 % (ref 39.0–52.0)
Hemoglobin: 12.8 g/dL — ABNORMAL LOW (ref 13.0–17.0)
MCH: 29.9 pg (ref 26.0–34.0)
MCHC: 31.1 g/dL (ref 30.0–36.0)
MCV: 96.3 fL (ref 80.0–100.0)
Platelets: 185 10*3/uL (ref 150–400)
RBC: 4.28 MIL/uL (ref 4.22–5.81)
RDW: 14.4 % (ref 11.5–15.5)
WBC: 7.7 10*3/uL (ref 4.0–10.5)
nRBC: 0 % (ref 0.0–0.2)

## 2019-05-30 LAB — PROTIME-INR
INR: 2.5 — ABNORMAL HIGH (ref 0.8–1.2)
Prothrombin Time: 26.7 seconds — ABNORMAL HIGH (ref 11.4–15.2)

## 2019-05-30 MED ORDER — AMOXICILLIN-POT CLAVULANATE 875-125 MG PO TABS: 1.0000 | ORAL_TABLET | Freq: Two times a day (BID) | ORAL | 0 refills | Status: AC

## 2019-05-30 MED ORDER — LACTOBACILLUS PO TABS: ORAL_TABLET | ORAL | 0 refills | Status: DC

## 2019-05-30 NOTE — Discharge Summary (Signed)
Physician Discharge Summary  Bradley Winters PNT:[id number] DOB: August 06, 1941 DOA: 05/26/2019  PCP: Everardo Beals, NP  Admit date: 05/26/2019 Discharge date: 05/30/2019  Admitted From: Home Disposition: Home  Recommendations for Outpatient Follow-up:  1. Follow up with PCP and his vein specialist in 1-2 weeks 2. Please obtain CBC/BMP/Mag at follow up 3. Please follow up on the following pending results: None  Home Health: None Equipment/Devices: None  Discharge Condition: Stable CODE STATUS: Full code  Hospital Course: Pt. with PMH ofchronic stasis edema, dermatitis, A. fib, anxiety, bladder cancer, HTN, thrombocytopenia; presented with complaint ofworsening right lower extremity swelling, was found to havecellulitis of right leg. Admitted on 05/26/2019 right lower extremity cellulitis after failing outpatient antibiotic failure with p.o. Keflex.  Patient admits to some complacency not elevating his legs as recommended by his doctors.  DVT excluded by venous Doppler.  Patient was treated with IV Zosyn and transitioned to oral Augmentin.  Patient was evaluated by PT/OT and no need was identified.  Please see individual problems below for more on hospital course.  Discharge Diagnoses:  Right lower extremity cellulitis with underlying chronic right lower extremity lymphedema/venous stasis-improving. -Failed outpatient antibiotic with oral Keflex. -Blood cultures negative.  Leukocytosis resolved. -Discontinued vancomycin 7/30-8/1 -Zosyn 7/30-8/2, then oral Augmentin -Discharged on Augmentin through 06/08/2019. -Continue home Lasix and Aldactone. -Recommend resuming compression stocking -Encourage lower extremity elevation and salt restriction -Follow-up with PCP and his venous specialist in 1 to 2 weeks. -Of note, high-dose calcium channel blockers can exacerbate lymphedema.  He is on high-dose Cardizem for A. Fib.  May consider looking into this.  Chronic A. Fib: Stable.  -Continue home Cardizem, metoprolol and warfarin.  INR therapeutic.  Essential hypertension: Stable. -Continue home regimen as above.  Obesity: BMI 34.67. -Encourage lifestyle change to lose weight  BPH: Continue home Proscar  Discharge Instructions  Discharge Instructions    Call MD for:  redness, tenderness, or signs of infection (pain, swelling, redness, odor or green/yellow discharge around incision site)   Complete by: As directed    Call MD for:  severe uncontrolled pain   Complete by: As directed    Call MD for:  temperature >100.4   Complete by: As directed    Diet - low sodium heart healthy   Complete by: As directed    Discharge instructions   Complete by: As directed    It has been a pleasure taking care of you! You were admitted with leg swelling/cellulitis (infection).  You were treated with antibiotic.  With that, your symptoms improved.  Discharging you more antibiotic to continue taking at home.  We also recommend wearing compression stocking and elevating your legs as much as possible. Please review your new medication list and the directions before you take your medications. Please call your primary care office and your vein specialist office as soon as possible to schedule hospital follow-up visit in 1 to 2 weeks.  Take care,   Increase activity slowly   Complete by: As directed      Allergies as of 05/30/2019      Reactions   Prednisone Other (See Comments)   Can't take because of heart medication      Medication List    STOP taking these medications   doxycycline 100 MG capsule Commonly known as: VIBRAMYCIN     TAKE these medications   acetaminophen 325 MG tablet Commonly known as: TYLENOL Take 325 mg by mouth every 6 (six) hours as needed for moderate pain.  amoxicillin-clavulanate 875-125 MG tablet Commonly known as: AUGMENTIN Take 1 tablet by mouth every 12 (twelve) hours.   cetirizine 10 MG tablet Commonly known as: ZYRTEC Take 10  mg by mouth daily.   diltiazem 360 MG 24 hr capsule Commonly known as: CARDIZEM CD TAKE 1 CAPSULE BY MOUTH EVERY DAY What changed: how much to take   finasteride 1 MG tablet Commonly known as: PROPECIA Take 1 mg by mouth daily.   fluticasone 50 MCG/ACT nasal spray Commonly known as: FLONASE Place 1 spray into both nostrils daily.   furosemide 40 MG tablet Commonly known as: LASIX TAKE 1 TABLET BY MOUTH TWICE A DAY What changed: when to take this   Klor-Con M20 20 MEQ tablet Generic drug: potassium chloride SA TAKE 1 TABLET BY MOUTH TWICE A DAY What changed: how much to take   Lactobacillus Tabs Take 1 tablet by mouth daily.   metoprolol succinate 25 MG 24 hr tablet Commonly known as: TOPROL-XL TAKE 1 TABLET BY MOUTH EVERY DAY   mupirocin ointment 2 % Commonly known as: BACTROBAN Apply 1 application topically daily.   spironolactone 25 MG tablet Commonly known as: ALDACTONE TAKE 1 TABLET BY MOUTH EVERY DAY   warfarin 2 MG tablet Commonly known as: COUMADIN Take as directed. If you are unsure how to take this medication, talk to your nurse or doctor. Original instructions: TAKE AS DIRECTED BY COUMADIN CLINIC What changed: See the new instructions.      Follow-up Information    Everardo Beals, NP. Schedule an appointment as soon as possible for a visit in 1 week(s).   Contact information: [place] [address] [phone]           Consultations:  None  Procedures/Studies:  2D Echo: None  Vas [place] Lower Extremity Venous (dvt) (only [facility])  Result Date: 05/27/2019  Lower Venous Study Indications: Swelling, and Edema.  Limitations: Body habitus and poor ultrasound/tissue interface. Comparison Study: previous study done 03/24/18 Performing Technologist: Abram Sander RVS  Examination Guidelines: A complete evaluation includes B-mode imaging, spectral Doppler, color Doppler, and power Doppler as needed of all accessible portions  of each vessel. Bilateral testing is considered an integral part of a complete examination. Limited examinations for reoccurring indications may be performed as noted.  +---------+---------------+---------+-----------+----------+--------------+ RIGHT    CompressibilityPhasicitySpontaneityPropertiesSummary        +---------+---------------+---------+-----------+----------+--------------+ CFV      Full           Yes      Yes                                 +---------+---------------+---------+-----------+----------+--------------+ SFJ      Full                                                        +---------+---------------+---------+-----------+----------+--------------+ FV Prox  Full                                                        +---------+---------------+---------+-----------+----------+--------------+ FV Mid   Full                                                        +---------+---------------+---------+-----------+----------+--------------+  FV Distal               Yes      Yes                                 +---------+---------------+---------+-----------+----------+--------------+ PFV      Full                                                        +---------+---------------+---------+-----------+----------+--------------+ POP      Full           Yes      Yes                                 +---------+---------------+---------+-----------+----------+--------------+ PTV      Full                                                        +---------+---------------+---------+-----------+----------+--------------+ PERO                                                  Not visualized +---------+---------------+---------+-----------+----------+--------------+   +----+---------------+---------+-----------+----------+-------+ LEFTCompressibilityPhasicitySpontaneityPropertiesSummary  +----+---------------+---------+-----------+----------+-------+ CFV Full           Yes      Yes                          +----+---------------+---------+-----------+----------+-------+     Summary: Right: There is no evidence of deep vein thrombosis in the lower extremity. Left: No evidence of common femoral vein obstruction.  *See table(s) above for measurements and observations. Electronically signed by Curt Jews MD on 05/27/2019 at 1:52:16 PM.    Final       Subjective: No major events overnight of morning.  Reports significant improvement in the swelling and erythema.  Has no other complaints.  Denies chest pain, dyspnea, GI or GU symptoms.  Feels ready to go home.   Discharge Exam: Vitals:   05/30/19 0508 05/30/19 0834  BP: 102/61 117/72  Pulse: 62   Resp: 18   Temp: 97.8 F (36.6 C)   SpO2: 94%     GENERAL: No acute distress.  Appears well.  HEENT: MMM.  Vision and hearing grossly intact.  NECK: Supple.  No JVD.  LUNGS:  No IWOB. Good air movement bilaterally. HEART:  RRR. Heart sounds normal.  ABD: Bowel sounds present. Soft. Non tender.  MSK/EXT: Ambulating in the room.  Lymphedema and venous stasis in both legs, right> left SKIN: Venous stasis in both lower extremities, right > left NEURO: Awake, alert and oriented appropriately.  No gross deficit.  PSYCH: Calm. Normal affect.       The results of significant diagnostics from this hospitalization (including imaging, microbiology, ancillary and laboratory) are listed below for reference.     Microbiology: Recent Results (from the past 240 hour(s))  SARS Coronavirus 2 (CEPHEID -  Performed in [facility] hospital lab), Hosp Order     Status: None   Collection Time: 05/26/19  6:13 PM   Specimen: Nasopharyngeal Swab  Result Value Ref Range Status   SARS Coronavirus 2 NEGATIVE NEGATIVE Final    Comment: (NOTE) If result is NEGATIVE SARS-CoV-2 target nucleic acids are NOT DETECTED. The SARS-CoV-2 RNA is  generally detectable in upper and lower  respiratory specimens during the acute phase of infection. The lowest  concentration of SARS-CoV-2 viral copies this assay can detect is 250  copies / mL. A negative result does not preclude SARS-CoV-2 infection  and should not be used as the sole basis for treatment or other  patient management decisions.  A negative result may occur with  improper specimen collection / handling, submission of specimen other  than nasopharyngeal swab, presence of viral mutation(s) within the  areas targeted by this assay, and inadequate number of viral copies  (<250 copies / mL). A negative result must be combined with clinical  observations, patient history, and epidemiological information. If result is POSITIVE SARS-CoV-2 target nucleic acids are DETECTED. The SARS-CoV-2 RNA is generally detectable in upper and lower  respiratory specimens dur ing the acute phase of infection.  Positive  results are indicative of active infection with SARS-CoV-2.  Clinical  correlation with patient history and other diagnostic information is  necessary to determine patient infection status.  Positive results do  not rule out bacterial infection or co-infection with other viruses. If result is PRESUMPTIVE POSTIVE SARS-CoV-2 nucleic acids MAY BE PRESENT.   A presumptive positive result was obtained on the submitted specimen  and confirmed on repeat testing.  While 2019 novel coronavirus  (SARS-CoV-2) nucleic acids may be present in the submitted sample  additional confirmatory testing may be necessary for epidemiological  and / or clinical management purposes  to differentiate between  SARS-CoV-2 and other Sarbecovirus currently known to infect humans.  If clinically indicated additional testing with an alternate test  methodology [phone]) is advised. The SARS-CoV-2 RNA is generally  detectable in upper and lower respiratory sp ecimens during the acute  phase of infection.  The expected result is Negative. Fact Sheet for Patients:  StrictlyIdeas.no Fact Sheet for Healthcare Providers: BankingDealers.co.za This test is not yet approved or cleared by the [place] FDA and has been authorized for detection and/or diagnosis of SARS-CoV-2 by FDA under an Emergency Use Authorization (EUA).  This EUA will remain in effect (meaning this test can be used) for the duration of the COVID-19 declaration under Section 564(b)(1) of the Act, 21 U.S.C. section 360bbb-3(b)(1), unless the authorization is terminated or revoked sooner. Performed at [facility], [place] [address] [place], [place] [address]   Culture, blood (single)     Status: None (Preliminary result)   Collection Time: 05/27/19 10:15 AM   Specimen: BLOOD  Result Value Ref Range Status   Specimen Description   Final    BLOOD LEFT ARM Performed at [facility] [address] [place], [place] [address]    Special Requests   Final    BOTTLES DRAWN AEROBIC AND ANAEROBIC Blood Culture adequate volume Performed at [facility] [address], [place], [place] [address]    Culture   Final    NO GROWTH 2 DAYS Performed at [facility] [address] [place],  [address]    Report Status PENDING  Incomplete     Labs: BNP (last 3 results) Recent  Labs    05/26/19 1702  BNP 357.8*   Basic Metabolic Panel: Recent Labs  Lab 05/26/19 1706 05/27/19 0257 05/28/19 0402  NA 138 137 137  K 3.9 3.5 4.0  CL 97* 100 98  CO2 30 28 28   GLUCOSE 107* 108* 98  BUN 18 16 14   CREATININE 1.20 1.00 1.22  CALCIUM 8.9 8.5* 8.3*  MG  --   --  2.4   Liver Function Tests: Recent Labs  Lab 05/26/19 1706 05/27/19 0257 05/28/19 0402  AST 17 14* 18  ALT 11 12 9   ALKPHOS 52 43 41  BILITOT 1.2 1.0 1.3*  PROT 7.6 6.5 6.6  ALBUMIN 3.7 3.1* 3.1*   No results for input(s): LIPASE, AMYLASE in the  last 168 hours. No results for input(s): AMMONIA in the last 168 hours. CBC: Recent Labs  Lab 05/26/19 1706 05/27/19 0257 05/28/19 0402 05/30/19 0316  WBC 13.1* 9.9 7.8 7.7  NEUTROABS 9.5*  --  4.8  --   HGB 12.7* 11.8* 11.7* 12.8*  HCT 41.0 37.1* 38.6* 41.2  MCV 96.0 95.9 96.7 96.3  PLT 160 128* 156 185   Cardiac Enzymes: No results for input(s): CKTOTAL, CKMB, CKMBINDEX, TROPONINI in the last 168 hours. BNP: Invalid input(s): POCBNP CBG: No results for input(s): GLUCAP in the last 168 hours. D-Dimer No results for input(s): DDIMER in the last 72 hours. Hgb A1c No results for input(s): HGBA1C in the last 72 hours. Lipid Profile No results for input(s): CHOL, HDL, LDLCALC, TRIG, CHOLHDL, LDLDIRECT in the last 72 hours. Thyroid function studies No results for input(s): TSH, T4TOTAL, T3FREE, THYROIDAB in the last 72 hours.  Invalid input(s): FREET3 Anemia work up No results for input(s): VITAMINB12, FOLATE, FERRITIN, TIBC, IRON, RETICCTPCT in the last 72 hours. Urinalysis    Component Value Date/Time   COLORURINE LT. YELLOW 06/14/2010 1135   APPEARANCEUR CLEAR 06/14/2010 1135   LABSPEC 1.015 06/14/2010 1135   PHURINE 5.5 06/14/2010 1135   GLUCOSEU NEGATIVE 06/14/2010 1135   BILIRUBINUR NEGATIVE 06/14/2010 1135   KETONESUR NEGATIVE 06/14/2010 1135   UROBILINOGEN 0.2 06/14/2010 1135   NITRITE NEGATIVE 06/14/2010 1135   LEUKOCYTESUR NEGATIVE 06/14/2010 1135   Sepsis Labs Invalid input(s): PROCALCITONIN,  WBC,  LACTICIDVEN   Time coordinating discharge: 35 minutes  SIGNED:  Mercy Riding, MD  [facility] 05/30/2019, 9:44 AM  If 7PM-7AM, please contact night-coverage [email or web address] Password [email or web address]

## 2019-05-30 NOTE — Plan of Care (Signed)

## 2019-05-30 NOTE — Progress Notes (Signed)
ANTICOAGULATION CONSULT NOTE - Follow Up Consult  Pharmacy Consult for Warfarin  Indication: atrial fibrillation  Allergies  Allergen Reactions  . Prednisone Other (See Comments)    Can't take because of heart medication    Patient Measurements: Height: 5\' 11"  (180.3 cm) Weight: 248 lb 9.6 oz (112.8 kg) IBW/kg (Calculated) : 75.3  Vital Signs: Temp: 97.8 F (36.6 C) (08/03 0508) Temp Source: Oral (08/03 0508) BP: 117/72 (08/03 0834) Pulse Rate: 62 (08/03 0508)  Labs: Recent Labs    05/28/19 0402 05/29/19 0342 05/30/19 0316  HGB 11.7*  --  12.8*  HCT 38.6*  --  41.2  PLT 156  --  185  LABPROT 28.1* 27.2* 26.7*  INR 2.7* 2.6* 2.5*  CREATININE 1.22  --   --     Estimated Creatinine Clearance: 63.7 mL/min (by C-G formula based on SCr of 1.22 mg/dL).   Medications:  PTA Warfarin 1mg  PO daily - last dose 05/26/2019 at 11am -Home dose confirmed with patient  Assessment: 48 y/oM on warfarin PTA for history of a-fib admitted for cellulitis of right leg. Pharmacy consulted to assist with dosing of warfarin while patient in the hospital. INR therapeutic on admission.  Today, 05/30/19  INR 2.5 remains therapeutic on 1mg  daily  CBC stable. Hgb improved  No significant drug interactions with warfarin noted. Broad spectrum antibiotics may potentially enhance effects of warfarin  Goal of Therapy:  INR 2-3 Monitor platelets by anticoagulation protocol: Yes   Plan:   Discharge planned this am, continue Warfarin 1mg  daily, resume at home after discharge today  Daily PT/INR  Monitor closely for s/sx of bleeding   Minda Ditto PharmD Pager [phone] 05/30/2019, 10:39 AM

## 2019-05-30 NOTE — Progress Notes (Signed)
Occupational Therapy Treatment Patient Details Name: Bradley Winters MRN: [id number] DOB: Feb 06, 1941 Today's Date: 05/30/2019    History of present illness Pt. is a 78 y/o male with PMH of chronic stasis edema, dermatitis, A. fib, anxiety, bladder cancer, HTN, thrombocytopenia; presented with complaint of worsening swelling, was found to have cellulitis of right leg.   OT comments  Pt declined AE as he was able to get dressed without AE. Pt did demonstrate LB dressing dressing RLE first. Pt much improved this day!   Follow Up Recommendations  No OT follow up    Equipment Recommendations  Other (comment)(potentially AE for dressing RLE)    Recommendations for Other Services      Precautions / Restrictions Precautions Precautions: None       Mobility Bed Mobility Overal bed mobility: Modified Independent             General bed mobility comments: increased time  Transfers Overall transfer level: Modified independent Equipment used: None                  Balance Overall balance assessment: Mild deficits observed, not formally tested                                         ADL either performed or assessed with clinical judgement   ADL Overall ADL's : Needs assistance/impaired Eating/Feeding: Cueing for safety;Independent   Grooming: Modified independent       Lower Body Bathing: Modified independent   Upper Body Dressing : Independent   Lower Body Dressing: Modified independent   Toilet Transfer: Modified Independent;RW             General ADL Comments: pt did well with OT this day. Pt mod I level     Vision Patient Visual Report: No change from baseline            Cognition Arousal/Alertness: Awake/alert Behavior During Therapy: WFL for tasks assessed/performed Overall Cognitive Status: Within Functional Limits for tasks assessed                                                     Pertinent  Vitals/ Pain       Pain Assessment: No/denies pain     Prior Functioning/Environment              Frequency  Min 2X/week        Progress Toward Goals  OT Goals(current goals can now be found in the care plan section)     Acute Rehab OT Goals Patient Stated Goal: be completely independent and get this RLE swelling down  Plan Discharge plan remains appropriate       AM-PAC OT "6 Clicks" Daily Activity     Outcome Measure   Help from another person eating meals?: None Help from another person taking care of personal grooming?: None Help from another person toileting, which includes using toliet, bedpan, or urinal?: None Help from another person bathing (including washing, rinsing, drying)?: None Help from another person to put on and taking off regular upper body clothing?: None Help from another person to put on and taking off regular lower body clothing?: None 6 Click Score: 24    End of Session Equipment Utilized  During Treatment: Rolling walker  OT Visit Diagnosis: Pain Pain - Right/Left: Right Pain - part of body: Leg   Activity Tolerance Patient tolerated treatment well   Patient Left with call bell/phone within reach;in chair   Nurse Communication Mobility status        Time: 1120-1130 OT Time Calculation (min): 10 min  Charges: OT General Charges $OT Visit: 1 Visit OT Treatments $Self Care/Home Management : 8-22 mins  Kari Baars, [place] [phone] Office- [phone]      [place], [place] 05/30/2019, 2:22 PM

## 2019-06-01 LAB — CULTURE, BLOOD (SINGLE)
Culture: NO GROWTH
Special Requests: ADEQUATE

## 2019-06-20 ENCOUNTER — Other Ambulatory Visit: Payer: Self-pay | Admitting: Cardiovascular Disease

## 2019-06-20 DIAGNOSIS — I482 Chronic atrial fibrillation, unspecified: Secondary | ICD-10-CM

## 2019-06-20 DIAGNOSIS — I422 Other hypertrophic cardiomyopathy: Secondary | ICD-10-CM

## 2019-06-24 ENCOUNTER — Telehealth: Payer: Self-pay

## 2019-06-24 NOTE — Telephone Encounter (Signed)
-----   Message from [name], MD sent at 06/22/2019  7:48 AM EDT ----- Study performed to evaluate nocturnal palpitations, waking him up at night. Frequent PVC's noted, but no clear cause of symptoms noted. No significant bradycardic events. Continue current medical therapy.

## 2019-06-24 NOTE — Telephone Encounter (Signed)
Reviewed results with patient who verbalized understanding.   Scheduled the patient for 6 month virtual visit with Dr. [name] 9/17 per his request. He is scheduled for echo and fasting labs 9/15. He was grateful for assistance.   Per his request, will mail appointment times to him next week.

## 2019-06-25 ENCOUNTER — Other Ambulatory Visit: Payer: Self-pay | Admitting: Cardiovascular Disease

## 2019-06-27 NOTE — Telephone Encounter (Signed)
Calendar of appointments mailed to patient.

## 2019-07-12 ENCOUNTER — Ambulatory Visit (HOSPITAL_COMMUNITY): Payer: Medicare Other | Attending: Cardiology

## 2019-07-12 ENCOUNTER — Ambulatory Visit (INDEPENDENT_AMBULATORY_CARE_PROVIDER_SITE_OTHER): Payer: Medicare Other | Admitting: *Deleted

## 2019-07-12 ENCOUNTER — Other Ambulatory Visit: Payer: Medicare Other

## 2019-07-12 ENCOUNTER — Other Ambulatory Visit: Payer: Self-pay

## 2019-07-12 ENCOUNTER — Other Ambulatory Visit: Payer: Self-pay | Admitting: Cardiovascular Disease

## 2019-07-12 DIAGNOSIS — I5032 Chronic diastolic (congestive) heart failure: Secondary | ICD-10-CM | POA: Diagnosis not present

## 2019-07-12 DIAGNOSIS — I421 Obstructive hypertrophic cardiomyopathy: Secondary | ICD-10-CM | POA: Insufficient documentation

## 2019-07-12 DIAGNOSIS — I4821 Permanent atrial fibrillation: Secondary | ICD-10-CM

## 2019-07-12 DIAGNOSIS — I4891 Unspecified atrial fibrillation: Secondary | ICD-10-CM

## 2019-07-12 DIAGNOSIS — I4892 Unspecified atrial flutter: Secondary | ICD-10-CM | POA: Diagnosis not present

## 2019-07-12 DIAGNOSIS — Z5181 Encounter for therapeutic drug level monitoring: Secondary | ICD-10-CM | POA: Diagnosis not present

## 2019-07-12 LAB — POCT INR: INR: 3.1 — AB (ref 2.0–3.0)

## 2019-07-12 NOTE — Patient Instructions (Addendum)
Description   Continue on same dosage 1/2 tablet daily. Continue eating green leafy veggies one day out of the week. Eat an extra serving of greens today. Recheck in 4 weeks. [facility]in Clinic [phone]. Please call if ordered any new medication or if scheduled for any procedure.

## 2019-07-12 NOTE — Telephone Encounter (Signed)
Pt is overdue for appt and has requested a refill on his Warfarin. Called pt and he stated he was on his way up, advised that we see pts by appt only in the Clinic and that I could assist. Pt states he has an appt today with lab and echo, will place an appt for the pt at 315p as he is needs to be on time for INR check and he is overdue. Pt is aware that we will need to see him before his other appts and if not he will need to be scheduled for another day.

## 2019-07-13 LAB — LIPID PANEL
Chol/HDL Ratio: 2.9 ratio (ref 0.0–5.0)
Cholesterol, Total: 138 mg/dL (ref 100–199)
HDL: 47 mg/dL (ref >39)
LDL Chol Calc (NIH): 73 mg/dL (ref 0–99)
Triglycerides: 97 mg/dL (ref 0–149)
VLDL Cholesterol Cal: 18 mg/dL (ref 5–40)

## 2019-07-13 LAB — TSH: TSH: 6.9 u[IU]/mL — ABNORMAL HIGH (ref 0.450–4.500)

## 2019-07-14 ENCOUNTER — Telehealth (INDEPENDENT_AMBULATORY_CARE_PROVIDER_SITE_OTHER): Payer: Medicare Other | Admitting: Cardiovascular Disease

## 2019-07-14 ENCOUNTER — Encounter: Payer: Self-pay | Admitting: Cardiovascular Disease

## 2019-07-14 ENCOUNTER — Other Ambulatory Visit: Payer: Self-pay

## 2019-07-14 VITALS — BP 134/66 | HR 69 | Ht 71.0 in

## 2019-07-14 DIAGNOSIS — I4821 Permanent atrial fibrillation: Secondary | ICD-10-CM | POA: Diagnosis not present

## 2019-07-14 DIAGNOSIS — I5032 Chronic diastolic (congestive) heart failure: Secondary | ICD-10-CM

## 2019-07-14 MED ORDER — DILTIAZEM HCL ER COATED BEADS 180 MG PO CP24: 180.0000 mg | ORAL_CAPSULE | Freq: Every day | ORAL | 3 refills | Status: DC

## 2019-07-14 NOTE — Patient Instructions (Signed)
Medication Instructions:  1) DECREASE CARDIZEM (diltiazem) to 180 daily  Labwork: None  Testing/Procedures: None  Follow-Up: Your provider wants you to follow-up in: 6 months with Dr. [name]. You will receive a reminder letter in the mail two months in advance. If you don't receive a letter, please call our office to schedule the follow-up appointment.

## 2019-07-14 NOTE — Progress Notes (Signed)
Virtual Visit via Telephone Note   This visit type was conducted due to national recommendations for restrictions regarding the COVID-19 Pandemic (e.g. social distancing) in an effort to limit this patient's exposure and mitigate transmission in our community.  Due to his co-morbid illnesses, this patient is at least at moderate risk for complications without adequate follow up.  This format is felt to be most appropriate for this patient at this time.  The patient did not have access to video technology/had technical difficulties with video requiring transitioning to audio format only (telephone).  All issues noted in this document were discussed and addressed.  No physical exam could be performed with this format.  Please refer to the patient's chart for his  consent to telehealth for [facility].   Date:  07/14/2019   ID:  Bradley Winters, DOB 08-27-1941, MRN [id number]  Patient Location: Home Provider Location: Office  PCP:  Everardo Beals, NP  Cardiologist:  [name], MD  Electrophysiologist:  None   Evaluation Performed:  Follow-Up Visit  Chief Complaint:  Shortness of breath  History of Present Illness:    Bradley Winters is a 78 y.o. male with permanent atrial fibrillation, chronic diastolic heart failure, and chronic venous stasis with leg edema.  The patient does not have symptoms concerning for COVID-19 infection (fever, chills, cough, or new shortness of breath).   He is doing fairly well at present, but getting over a hospital admission for cellulitis. He continues to have leg swelling and hasn't been compliant with a low sodium diet. He denies chest pain or exertional dyspnea. No orthopnea or PND. He does have occasional bouts of shortness of breath unrelated to exertion.  Past Medical History:  Diagnosis Date  . Anxiety    MOSTLY AT NIGHT  . Bladder cancer ([place])   . ED (erectile dysfunction)   . History of colonoscopy with polypectomy    BENIGN   . History of gastric ulcer   . History of seizures as a child    FEBRILE  . Hypertension   . Hypertrophic cardiomyopathy ([place])    HEREDITARY--  POSITIVE for MYBPC3  GENE (HETEROZYGOUS)  . Persistent atrial fibrillation    CARDIOLOGIST-- DR [name]  AND DR [name]  . PONV (postoperative nausea and vomiting)   . Thrombocytopenia ([place]) PT ASYMPTOMATIC   MILD--  HEMOTOLOGIST--  DR [name]  . Venous insufficiency   . Wears glasses    Past Surgical History:  Procedure Laterality Date  . CARDIAC ELECTROPHYSIOLOGY MAPPING AND ABLATION  12-07-2008  &  05-08-2009  DR [name]   SUCCESSFUL ABLATION ATRIAL-FIBRILLATION AND CARDIOVERSION  . CARDIOVERSION  MULTIPLE -----  BETWEEN 2008  to  07-17-2009  . CATARACT EXTRACTION W/ INTRAOCULAR LENS IMPLANT Right   . SLEEP STUDY  2011   NEGATIVE  . TEE WITH CARDIOVERSION  11-27-2007  . TONSILLECTOMY AND ADENOIDECTOMY  AS CHILD  . TRANSTHORACIC ECHOCARDIOGRAM  06-16-2013  DR Waldemar Siegel   PT HAS HX HCM/  MODERATE LVH with MILO SAM/  EF 55-60%/  MILD MR/  SEVERE LAE/  MODERATE RAE  . TRANSURETHRAL RESECTION OF BLADDER TUMOR N/A 02/10/2014   Procedure: TRANSURETHRAL RESECTION OF BLADDER TUMOR (TURBT);  Surgeon: [name], MD;  Location: [facility];  Service: Urology;  Laterality: N/A;  . TRANSURETHRAL RESECTION OF BLADDER TUMOR N/A 04/07/2014   Procedure: TRANSURETHRAL  BLADDER BIOPSY;  Surgeon: [name], MD;  Location[facility];  Service: Urology;  Laterality: N/A;  Current Meds  Medication Sig  . acetaminophen (TYLENOL) 325 MG tablet Take 325 mg by mouth every 6 (six) hours as needed for moderate pain.   [name] amoxicillin-clavulanate (AUGMENTIN) 875-125 MG tablet Take 1 tablet by mouth every 12 (twelve) hours.  . cetirizine (ZYRTEC) 10 MG tablet Take 10 mg by mouth daily.  [name] diltiazem (CARDIZEM CD) 360 MG 24 hr capsule TAKE 1 CAPSULE BY MOUTH EVERY DAY  . finasteride (PROPECIA) 1 MG tablet Take 1 mg by mouth daily.   . fluticasone (FLONASE) 50 MCG/ACT nasal spray Place 1 spray into both nostrils daily.  . furosemide (LASIX) 40 MG tablet TAKE 1 TABLET BY MOUTH TWICE A DAY  . KLOR-CON M20 20 MEQ tablet TAKE 1 TABLET BY MOUTH TWICE A DAY  . Lactobacillus TABS Take 1 tablet by mouth daily.  . metoprolol succinate (TOPROL-XL) 25 MG 24 hr tablet TAKE 1 TABLET BY MOUTH EVERY DAY  . spironolactone (ALDACTONE) 25 MG tablet TAKE 1 TABLET BY MOUTH EVERY DAY  . warfarin (COUMADIN) 2 MG tablet TAKE AS DIRECTED BY COUMADIN CLINIC     Allergies:   Prednisone   Social History   Tobacco Use  . Smoking status: Former Smoker    Years: 42.00    Types: Cigarettes    Quit date: 10/27/1998    Years since quitting: 20.7  . Smokeless tobacco: Never Used  Substance Use Topics  . Alcohol use: Yes    Comment: VERY RARE  . Drug use: No     Family Hx: The patient's family history includes Colon cancer in an other family member.  ROS:   Please see the history of present illness.    All other systems reviewed and are negative.  Prior CV studies:   The following studies were reviewed today:  Echo 07/12/2019: IMPRESSIONS    1. The left ventricle has low normal systolic function, with an ejection fraction of 50-55%. The cavity size was normal. There is mildly increased left ventricular wall thickness. Left ventricular diastolic function could not be evaluated secondary to  atrial fibrillation.  2. The right ventricle has normal systolic function. The cavity was normal. Right ventricular systolic pressure is moderately elevated with an estimated pressure of 53.0 mmHg.  3. Left atrial size was severely dilated.  4. Right atrial size was moderately dilated.  5. The mitral valve is grossly normal.  6. The tricuspid valve is grossly normal.  7. The aortic valve is tricuspid. Aortic valve regurgitation is trivial by color flow Doppler. No stenosis of the aortic valve.  8. The aorta is normal unless otherwise noted.  9.  The inferior vena cava was dilated in size with <50% respiratory variability. 10. Low normal LV function; mild LVH; trace AI; mild MR; biatrial enlargement; mild TR; moderate pulmonary hypertension.  Event moitor 05-31-2019: Study Highlights  1) the basic rhythm is atrial fibrillation with controlled ventricular rate, average HR 80 bpm 2) there are frequent PVC's, overall burden 6%, with short runs of consecutive PVC's longest 4 beats 3) There are no significant bradycardic events or pathologic pauses    Labs/Other Tests and Data Reviewed:    EKG:  No ECG reviewed.  Recent Labs: 05/26/2019: B Natriuretic Peptide 177.0 05/28/2019: ALT 9; BUN 14; Creatinine, Ser 1.22; Magnesium 2.4; Potassium 4.0; Sodium 137 05/30/2019: Hemoglobin 12.8; Platelets 185 07/12/2019: TSH 6.900   Recent Lipid Panel Lab Results  Component Value Date/Time   CHOL 138 07/12/2019 03:25 PM   TRIG 97 07/12/2019 03:25 PM  HDL 47 07/12/2019 03:25 PM   CHOLHDL 2.9 07/12/2019 03:25 PM   CHOLHDL 3 05/22/2015 09:06 AM   LDLCALC 69 05/22/2015 09:06 AM    Wt Readings from Last 3 Encounters:  05/26/19 248 lb 9.6 oz (112.8 kg)  11/16/18 230 lb (104.3 kg)  07/15/18 240 lb 12.8 oz (109.2 kg)     Objective:    Vital Signs:  BP 134/66   Pulse 69   Ht 5\' 11"  (1.803 m)   BMI 34.67 kg/m    VITAL SIGNS:  reviewed Remaining exam deferred secondary to visit type.   ASSESSMENT & PLAN:    1. Permanent atrial fibrillation: heart rate controlled. Continue warfarin anticoagulation. Decrease diltiazem to 180 mg in case it is contributing to leg swelling (currently 360 mg). 2. Chronic diastolic HF: continue furosemide and spironolactone at current dose, no changes made today. Echo reviewed as outlined above. LVEF 50-55%, pulmonary HTN noted.  3. HTN: BP controlled on current Rx. Decrease diltiazem as above.   COVID-19 Education: The signs and symptoms of COVID-19 were discussed with the patient and how to seek care for  testing (follow up with PCP or arrange E-visit).  The importance of social distancing was discussed today.  Time:   Today, I have spent 18 minutes with the patient with telehealth technology discussing the above problems.     Medication Adjustments/Labs and Tests Ordered: Current medicines are reviewed at length with the patient today.  Concerns regarding medicines are outlined above.   Tests Ordered: No orders of the defined types were placed in this encounter.   Medication Changes: No orders of the defined types were placed in this encounter.   Follow Up:  Virtual Visit or In Person in 6 month(s)  Signed, [name], MD  07/14/2019 2:43 PM    [place]

## 2019-07-21 ENCOUNTER — Encounter: Payer: Self-pay | Admitting: Cardiovascular Disease

## 2019-07-21 ENCOUNTER — Other Ambulatory Visit: Payer: Self-pay | Admitting: Cardiovascular Disease

## 2019-07-21 DIAGNOSIS — I422 Other hypertrophic cardiomyopathy: Secondary | ICD-10-CM

## 2019-07-21 DIAGNOSIS — I482 Chronic atrial fibrillation, unspecified: Secondary | ICD-10-CM

## 2019-07-21 NOTE — Telephone Encounter (Signed)
error 

## 2019-08-09 ENCOUNTER — Other Ambulatory Visit: Payer: Self-pay

## 2019-08-09 ENCOUNTER — Ambulatory Visit (INDEPENDENT_AMBULATORY_CARE_PROVIDER_SITE_OTHER): Payer: Medicare Other | Admitting: *Deleted

## 2019-08-09 DIAGNOSIS — I4892 Unspecified atrial flutter: Secondary | ICD-10-CM | POA: Diagnosis not present

## 2019-08-09 DIAGNOSIS — Z5181 Encounter for therapeutic drug level monitoring: Secondary | ICD-10-CM | POA: Diagnosis not present

## 2019-08-09 DIAGNOSIS — I4891 Unspecified atrial fibrillation: Secondary | ICD-10-CM | POA: Diagnosis not present

## 2019-08-09 LAB — POCT INR: INR: 3.1 — AB (ref 2.0–3.0)

## 2019-08-09 NOTE — Patient Instructions (Signed)
Description   Continue on same dosage 1/2 tablet daily. Continue eating green leafy veggies one day out of the week. Eat an extra serving of greens today. Recheck in 4 weeks. [facility] [phone]. Please call if ordered any new medication or if scheduled for any procedure.

## 2019-08-12 ENCOUNTER — Other Ambulatory Visit: Payer: Self-pay | Admitting: Cardiovascular Disease

## 2019-08-23 ENCOUNTER — Other Ambulatory Visit: Payer: Self-pay | Admitting: Cardiovascular Disease

## 2019-09-17 ENCOUNTER — Other Ambulatory Visit: Payer: Self-pay | Admitting: Cardiovascular Disease

## 2019-09-17 DIAGNOSIS — I422 Other hypertrophic cardiomyopathy: Secondary | ICD-10-CM

## 2019-09-17 DIAGNOSIS — I482 Chronic atrial fibrillation, unspecified: Secondary | ICD-10-CM

## 2019-09-19 LAB — PROTIME-INR: INR: 2.9 — AB (ref 0.9–1.1)

## 2019-09-21 ENCOUNTER — Ambulatory Visit (INDEPENDENT_AMBULATORY_CARE_PROVIDER_SITE_OTHER): Payer: Medicare Other | Admitting: Interventional Cardiology

## 2019-09-21 DIAGNOSIS — I4891 Unspecified atrial fibrillation: Secondary | ICD-10-CM | POA: Diagnosis not present

## 2019-09-21 DIAGNOSIS — I4892 Unspecified atrial flutter: Secondary | ICD-10-CM

## 2019-09-21 DIAGNOSIS — Z5181 Encounter for therapeutic drug level monitoring: Secondary | ICD-10-CM

## 2019-09-21 NOTE — Patient Instructions (Signed)
Description   Continue on same dosage 1/2 tablet daily. Continue eating green leafy veggies one day out of the week. Eat an extra serving of greens today. Recheck in 5 weeks. [facility] [phone]. Please call if ordered any new medication or if scheduled for any procedure.

## 2019-10-15 ENCOUNTER — Other Ambulatory Visit: Payer: Self-pay | Admitting: Cardiovascular Disease

## 2019-10-15 DIAGNOSIS — I421 Obstructive hypertrophic cardiomyopathy: Secondary | ICD-10-CM

## 2019-10-15 DIAGNOSIS — I4891 Unspecified atrial fibrillation: Secondary | ICD-10-CM

## 2019-12-06 ENCOUNTER — Telehealth: Payer: Self-pay | Admitting: Cardiovascular Disease

## 2019-12-06 LAB — PROTIME-INR: INR: 6.2 — AB (ref 0.9–1.1)

## 2019-12-06 NOTE — Telephone Encounter (Signed)
Received lab result today stating that pt's INR was 6.2, specimen was collected on 11/30/2019. Called pt and LMOM. Pt also trying to call into the office.Spoke with pt who stated that he already took his warfarin today but is not having any bleeding. Pt stated that he has not missed any doses of warfarin.  Scheduled for pt to come in tomorrow to get INR checked. Instructed pt to not take any warfarin until he comes in tomorrow and gets INR checked and further dosing instructions are given.

## 2019-12-06 NOTE — Telephone Encounter (Signed)
Patient got a call from the Nurse at [facility] and told him that his PNR was at 6, and that was extremely high and he needed to let Dr. [name] know asap.  The patient feels that it may be an error, because he had the same thing happen with this urgent care in the past. He is just doing what the urgent care recommends and letting DR. [name] know

## 2019-12-07 ENCOUNTER — Other Ambulatory Visit: Payer: Self-pay

## 2019-12-07 ENCOUNTER — Ambulatory Visit (INDEPENDENT_AMBULATORY_CARE_PROVIDER_SITE_OTHER): Payer: Medicare Other | Admitting: *Deleted

## 2019-12-07 DIAGNOSIS — I4892 Unspecified atrial flutter: Secondary | ICD-10-CM | POA: Diagnosis not present

## 2019-12-07 DIAGNOSIS — I4891 Unspecified atrial fibrillation: Secondary | ICD-10-CM | POA: Diagnosis not present

## 2019-12-07 DIAGNOSIS — Z5181 Encounter for therapeutic drug level monitoring: Secondary | ICD-10-CM | POA: Diagnosis not present

## 2019-12-07 LAB — POCT INR: INR: 2.7 (ref 2.0–3.0)

## 2019-12-07 NOTE — Patient Instructions (Signed)
Description   Continue on same dosage 1/2 tablet daily. Continue eating green leafy veggies one day out of the week. Recheck in 6 weeks. Coumadin Clinic [phone]. Please call if ordered any new medication or if scheduled for any procedure.       

## 2020-01-16 ENCOUNTER — Other Ambulatory Visit: Payer: Self-pay | Admitting: Cardiovascular Disease

## 2020-01-18 ENCOUNTER — Other Ambulatory Visit: Payer: Self-pay

## 2020-01-18 ENCOUNTER — Telehealth: Payer: Self-pay | Admitting: Cardiovascular Disease

## 2020-01-18 ENCOUNTER — Ambulatory Visit (INDEPENDENT_AMBULATORY_CARE_PROVIDER_SITE_OTHER): Payer: Medicare Other

## 2020-01-18 DIAGNOSIS — I4892 Unspecified atrial flutter: Secondary | ICD-10-CM

## 2020-01-18 DIAGNOSIS — Z5181 Encounter for therapeutic drug level monitoring: Secondary | ICD-10-CM | POA: Diagnosis not present

## 2020-01-18 DIAGNOSIS — I4891 Unspecified atrial fibrillation: Secondary | ICD-10-CM

## 2020-01-18 LAB — POCT INR: INR: 2.7 (ref 2.0–3.0)

## 2020-01-18 NOTE — Telephone Encounter (Signed)
Bradley Winters would like a call about a visit with Dr. [name]

## 2020-01-18 NOTE — Patient Instructions (Signed)
Description   Continue on same dosage 1/2 tablet daily. Continue eating green leafy veggies one day out of the week. Recheck in 6 weeks. Coumadin Clinic [phone]. Please call if ordered any new medication or if scheduled for any procedure.       

## 2020-01-19 NOTE — Telephone Encounter (Signed)
Left message to call back  

## 2020-01-20 NOTE — Telephone Encounter (Signed)
Left message to call back  

## 2020-02-02 NOTE — Telephone Encounter (Signed)
Left message for patient that several messages have been left on VM and to call back if assistance is needed.

## 2020-02-27 ENCOUNTER — Telehealth: Payer: Self-pay | Admitting: Cardiovascular Disease

## 2020-02-27 NOTE — Telephone Encounter (Signed)
The patient called to schedule visit with Dr. [name].  Arranged visit 5/24. He was grateful for call and agrees with treatment plan.

## 2020-02-27 NOTE — Telephone Encounter (Signed)
New Message  Patient is calling in to speak with Dr. Antionette Char nurse Valetta Fuller. States that he is returning a missed cal from her. Please give patient a call back to assist.

## 2020-02-27 NOTE — Telephone Encounter (Signed)
Patient states he is returning Katy's call.

## 2020-02-29 ENCOUNTER — Other Ambulatory Visit: Payer: Self-pay

## 2020-02-29 ENCOUNTER — Ambulatory Visit (INDEPENDENT_AMBULATORY_CARE_PROVIDER_SITE_OTHER): Payer: Medicare Other | Admitting: *Deleted

## 2020-02-29 DIAGNOSIS — I4891 Unspecified atrial fibrillation: Secondary | ICD-10-CM | POA: Diagnosis not present

## 2020-02-29 DIAGNOSIS — I4892 Unspecified atrial flutter: Secondary | ICD-10-CM | POA: Diagnosis not present

## 2020-02-29 DIAGNOSIS — Z5181 Encounter for therapeutic drug level monitoring: Secondary | ICD-10-CM | POA: Diagnosis not present

## 2020-02-29 LAB — POCT INR: INR: 2.5 (ref 2.0–3.0)

## 2020-02-29 NOTE — Patient Instructions (Signed)
Description   Continue on same dosage 1/2 tablet daily. Continue eating green leafy veggies one day out of the week. Recheck in 6 weeks. Coumadin Clinic [phone]. Please call if ordered any new medication or if scheduled for any procedure.       

## 2020-03-11 ENCOUNTER — Other Ambulatory Visit: Payer: Self-pay | Admitting: Cardiovascular Disease

## 2020-03-19 ENCOUNTER — Other Ambulatory Visit: Payer: Self-pay

## 2020-03-19 ENCOUNTER — Encounter: Payer: Self-pay | Admitting: Cardiovascular Disease

## 2020-03-19 ENCOUNTER — Ambulatory Visit (INDEPENDENT_AMBULATORY_CARE_PROVIDER_SITE_OTHER): Payer: Medicare Other | Admitting: Cardiovascular Disease

## 2020-03-19 VITALS — BP 130/68 | HR 75 | Ht 71.0 in | Wt 258.8 lb

## 2020-03-19 DIAGNOSIS — I4891 Unspecified atrial fibrillation: Secondary | ICD-10-CM | POA: Diagnosis not present

## 2020-03-19 DIAGNOSIS — I5032 Chronic diastolic (congestive) heart failure: Secondary | ICD-10-CM | POA: Diagnosis not present

## 2020-03-19 DIAGNOSIS — I890 Lymphedema, not elsewhere classified: Secondary | ICD-10-CM

## 2020-03-19 DIAGNOSIS — I10 Essential (primary) hypertension: Secondary | ICD-10-CM

## 2020-03-19 NOTE — Progress Notes (Signed)
Cardiology Office Note:    Date:  03/19/2020   ID:  Bradley Winters, DOB November 08, 1940, MRN [id number]  PCP:  Everardo Beals, NP  Cardiologist:  [name], MD  Electrophysiologist:  None   Referring MD: Everardo Beals, NP   Chief Complaint  Patient presents with  . Leg Swelling    History of Present Illness:    Bradley Winters is a 79 y.o. male with a hx of permanent atrial fibrillation, chronic diastolic heart failure, and lymphedema, presenting for follow-up evaluation.   The patient is here alone today.  He continues to struggle with lymphedema and recurrent cellulitis.  He is finishing a course of oral antibiotics now.  He was evaluated by Dr. Donnetta Hutching with vascular surgery last year.  He has not had much success with compression devices.  He has not been elevating his legs on a regular basis.  The patient denies chest pain or pressure.  He has some shortness of breath with activity.  Notes that his weight is up.  No orthopnea or PND.  Occasional heart palpitations.  Past Medical History:  Diagnosis Date  . Anxiety    MOSTLY AT NIGHT  . Bladder cancer ([place])   . ED (erectile dysfunction)   . History of colonoscopy with polypectomy    BENIGN  . History of gastric ulcer   . History of seizures as a child    FEBRILE  . Hypertension   . Hypertrophic cardiomyopathy ([place])    HEREDITARY--  POSITIVE for MYBPC3  GENE (HETEROZYGOUS)  . Persistent atrial fibrillation ([place])    CARDIOLOGIST-- DR [name]  AND DR [name]  . PONV (postoperative nausea and vomiting)   . Thrombocytopenia ([place]) PT ASYMPTOMATIC   MILD--  HEMOTOLOGIST--  DR [name]  . Venous insufficiency   . Wears glasses     Past Surgical History:  Procedure Laterality Date  . CARDIAC ELECTROPHYSIOLOGY MAPPING AND ABLATION  12-07-2008  &  05-08-2009  DR [name]   SUCCESSFUL ABLATION ATRIAL-FIBRILLATION AND CARDIOVERSION  . CARDIOVERSION  MULTIPLE -----  BETWEEN 2008  to  07-17-2009  . CATARACT EXTRACTION W/  INTRAOCULAR LENS IMPLANT Right   . SLEEP STUDY  2011   NEGATIVE  . TEE WITH CARDIOVERSION  11-27-2007  . TONSILLECTOMY AND ADENOIDECTOMY  AS CHILD  . TRANSTHORACIC ECHOCARDIOGRAM  06-16-2013  DR COOPER   PT HAS HX HCM/  MODERATE LVH with MILO SAM/  EF 55-60%/  MILD MR/  SEVERE LAE/  MODERATE RAE  . TRANSURETHRAL RESECTION OF BLADDER TUMOR N/A 02/10/2014   Procedure: TRANSURETHRAL RESECTION OF BLADDER TUMOR (TURBT);  Surgeon: [name], MD;  Location: [facility];  Service: Urology;  Laterality: N/A;  . TRANSURETHRAL RESECTION OF BLADDER TUMOR N/A 04/07/2014   Procedure: TRANSURETHRAL  BLADDER BIOPSY;  Surgeon: [name], MD;  Location[facility];  Service: Urology;  Laterality: N/A;    Current Medications: Current Meds  Medication Sig  . acetaminophen (TYLENOL) 325 MG tablet Take 325 mg by mouth every 6 (six) hours as needed for moderate pain.   [name] amoxicillin-clavulanate (AUGMENTIN) 875-125 MG tablet Take 1 tablet by mouth every 12 (twelve) hours.  . cetirizine (ZYRTEC) 10 MG tablet Take 10 mg by mouth daily.  [name] diltiazem (CARDIZEM CD) 180 MG 24 hr capsule Take 1 capsule (180 mg total) by mouth daily.  . finasteride (PROPECIA) 1 MG tablet Take 1 mg by mouth daily.  . fluticasone (FLONASE) 50 MCG/ACT nasal spray Place 1 spray into  both nostrils daily.  . furosemide (LASIX) 40 MG tablet TAKE 1 TABLET BY MOUTH TWICE A DAY  . KLOR-CON M20 20 MEQ tablet TAKE 1 TABLET BY MOUTH TWICE A DAY  . Lactobacillus TABS Take 1 tablet by mouth daily.  [name] levothyroxine (SYNTHROID) 75 MCG tablet Take 75 mcg by mouth daily before breakfast.  . metoprolol succinate (TOPROL-XL) 25 MG 24 hr tablet TAKE 1 TABLET BY MOUTH EVERY DAY  . spironolactone (ALDACTONE) 25 MG tablet TAKE 1 TABLET BY MOUTH EVERY DAY  . warfarin (COUMADIN) 2 MG tablet TAKE AS DIRECTED BY COUMADIN CLINIC     Allergies:   Prednisone   Social History   Socioeconomic History  . Marital status:  Married    Spouse name: Not on file  . Number of children: Not on file  . Years of education: Not on file  . Highest education level: Not on file  Occupational History  . Occupation: retired  Tobacco Use  . Smoking status: Former Smoker    Years: 42.00    Types: Cigarettes    Quit date: 10/27/1998    Years since quitting: 21.4  . Smokeless tobacco: Never Used  Substance and Sexual Activity  . Alcohol use: Yes    Comment: VERY RARE  . Drug use: No  . Sexual activity: Not on file  Other Topics Concern  . Not on file  Social History Narrative  . Not on file   Social Determinants of Health   Financial Resource Strain:   . Difficulty of Paying Living Expenses:   Food Insecurity:   . Worried About Charity fundraiser in the Last Year:   . Arboriculturist in the Last Year:   Transportation Needs:   . Film/video editor (Medical):   [name] Lack of Transportation (Non-Medical):   Physical Activity:   . Days of Exercise per Week:   . Minutes of Exercise per Session:   Stress:   . Feeling of Stress :   Social Connections:   . Frequency of Communication with Friends and Family:   . Frequency of Social Gatherings with Friends and Family:   . Attends Religious Services:   . Active Member of Clubs or Organizations:   . Attends Archivist Meetings:   [name] Marital Status:      Family History: The patient's family history includes Colon cancer in an other family member.  ROS:   Please see the history of present illness.    All other systems reviewed and are negative.  EKGs/Labs/Other Studies Reviewed:    The following studies were reviewed today: Echo 07/12/2019: IMPRESSIONS    1. The left ventricle has low normal systolic function, with an ejection  fraction of 50-55%. The cavity size was normal. There is mildly increased  left ventricular wall thickness. Left ventricular diastolic function could  not be evaluated secondary to  atrial fibrillation.  2. The right  ventricle has normal systolic function. The cavity was  normal. Right ventricular systolic pressure is moderately elevated with an  estimated pressure of 53.0 mmHg.  3. Left atrial size was severely dilated.  4. Right atrial size was moderately dilated.  5. The mitral valve is grossly normal.  6. The tricuspid valve is grossly normal.  7. The aortic valve is tricuspid. Aortic valve regurgitation is trivial  by color flow Doppler. No stenosis of the aortic valve.  8. The aorta is normal unless otherwise noted.  9. The inferior vena cava was dilated  in size with <50% respiratory  variability.  10. Low normal LV function; mild LVH; trace AI; mild MR; biatrial  enlargement; mild TR; moderate pulmonary hypertension.   FINDINGS  Left Ventricle: The left ventricle has low normal systolic function, with  an ejection fraction of 50-55%. The cavity size was normal. There is  mildly increased left ventricular wall thickness. Left ventricular  diastolic function could not be evaluated  secondary to atrial fibrillation.   Right Ventricle: The right ventricle has normal systolic function. The  cavity was normal. Right ventricular systolic pressure is moderately  elevated with an estimated pressure of 53.0 mmHg.   Left Atrium: Left atrial size was severely dilated.   Right Atrium: Right atrial size was moderately dilated. Right atrial  pressure is estimated at 15 mmHg.   Interatrial Septum: No atrial level shunt detected by color flow Doppler.   Pericardium: There is no evidence of pericardial effusion.   Mitral Valve: The mitral valve is grossly normal. Mitral valve  regurgitation is mild by color flow Doppler.   Tricuspid Valve: The tricuspid valve is grossly normal. Tricuspid valve  regurgitation is mild by color flow Doppler.   Aortic Valve: The aortic valve is tricuspid Aortic valve regurgitation is  trivial by color flow Doppler. There is No stenosis of the aortic valve.    Pulmonic Valve: The pulmonic valve was grossly normal. Pulmonic valve  regurgitation is trivial by color flow Doppler.   Aorta: The aorta is normal unless otherwise noted.   Venous: The inferior vena cava is dilated in size with less than 50%  respiratory variability.   Additional Comments: Low normal LV function; mild LVH; trace AI; mild MR;  biatrial enlargement; mild TR; moderate pulmonary hypertension.   Compared to previous exam: 10/23/17 EF 60-65%. PA pressure 61mmHg.   Cardiac Event Monitor 05/31/2019: Study Highlights  1) the basic rhythm is atrial fibrillation with controlled ventricular rate, average HR 80 bpm 2) there are frequent PVC's, overall burden 6%, with short runs of consecutive PVC's longest 4 beats 3) There are no significant bradycardic events or pathologic pauses    EKG:  EKG is ordered today.  The ekg ordered today demonstrates atrial fibrillation 75 bpm, nonspecific IVCD, no significant change from prior  Recent Labs: 05/26/2019: B Natriuretic Peptide 177.0 05/28/2019: ALT 9; BUN 14; Creatinine, Ser 1.22; Magnesium 2.4; Potassium 4.0; Sodium 137 05/30/2019: Hemoglobin 12.8; Platelets 185 07/12/2019: TSH 6.900  Recent Lipid Panel    Component Value Date/Time   CHOL 138 07/12/2019 1525   TRIG 97 07/12/2019 1525   HDL 47 07/12/2019 1525   CHOLHDL 2.9 07/12/2019 1525   CHOLHDL 3 05/22/2015 0906   VLDL 18.6 05/22/2015 0906   LDLCALC 73 07/12/2019 1525    Physical Exam:    VS:  BP 130/68   Pulse 75   Ht 5\' 11"  (1.803 m)   Wt 258 lb 12.8 oz (117.4 kg)   BMI 36.10 kg/m     Wt Readings from Last 3 Encounters:  03/19/20 258 lb 12.8 oz (117.4 kg)  05/26/19 248 lb 9.6 oz (112.8 kg)  11/16/18 230 lb (104.3 kg)     GEN:  Well nourished, well developed in no acute distress HEENT: Normal NECK: No JVD; No carotid bruits LYMPHATICS: No lymphadenopathy CARDIAC: Irregularly irregular with 2/6 systolic murmur at the right upper sternal border RESPIRATORY:   Clear to auscultation without rales, wheezing or rhonchi  ABDOMEN: Soft, non-tender, non-distended MUSCULOSKELETAL: Marked edema of the right leg from the  thigh all the way down to the foot, 2+ edema of the left ankle SKIN: Warm and dry NEUROLOGIC:  Alert and oriented x 3 PSYCHIATRIC:  Normal affect   ASSESSMENT:    1. Chronic diastolic heart failure ([place])   2. Atrial fibrillation, unspecified type ([facility])   3. Lymphedema   4. Essential hypertension    PLAN:    In order of problems listed above:  1. The patient appears stable with New York Heart Association functional class II symptoms.  He remains on chronic diuretic therapy with furosemide 40 mg twice daily.  His echocardiogram from last year is reviewed.  We will repeat this when he returns for follow-up in 6 months. 2. Longstanding atrial fibrillation noted, heart rate controlled on metoprolol succinate and diltiazem.  Anticoagulated with warfarin. 3. His right leg lymphedema is really bad.  Unfortunately he has failed conservative measures.  We will check into whether there are any other mechanical treatments available for compression.  Probably not much to offer. 4. Blood pressure is well controlled on current medications.  Most recent labs are reviewed.   Medication Adjustments/Labs and Tests Ordered: Current medicines are reviewed at length with the patient today.  Concerns regarding medicines are outlined above.  Orders Placed This Encounter  Procedures  . EKG 12-Lead  . ECHOCARDIOGRAM COMPLETE   No orders of the defined types were placed in this encounter.   Patient Instructions  Medication Instructions:  Your provider recommends that you continue on your current medications as directed. Please refer to the Current Medication list given to you today.   *If you need a refill on your cardiac medications before your next appointment, please call your pharmacy*   Follow-Up: You will be called to arrange your 6 month echo  and office visit when Dr. Antionette Char schedule for November/December is available.     Signed, [name], MD  03/19/2020 4:59 PM    [facility]

## 2020-03-19 NOTE — Patient Instructions (Signed)
Medication Instructions:  Your provider recommends that you continue on your current medications as directed. Please refer to the Current Medication list given to you today.   *If you need a refill on your cardiac medications before your next appointment, please call your pharmacy*   Follow-Up: You will be called to arrange your 6 month echo and office visit when Dr. Antionette Char schedule for November/December is available.

## 2020-03-28 ENCOUNTER — Telehealth: Payer: Self-pay | Admitting: Cardiovascular Disease

## 2020-03-28 NOTE — Telephone Encounter (Signed)
Patient states he is requesting to speak with Dr. Antionette Char nurse. However, he refused to discuss what he would like to speak with the nurse in regards to. Please call.

## 2020-03-28 NOTE — Telephone Encounter (Signed)
The patient requests to come to the office to leave Dr. [name] a gift. He will come by today and I will meet him at check-in. He was grateful for assistance.

## 2020-04-11 ENCOUNTER — Ambulatory Visit (INDEPENDENT_AMBULATORY_CARE_PROVIDER_SITE_OTHER): Payer: Medicare Other

## 2020-04-11 ENCOUNTER — Other Ambulatory Visit: Payer: Self-pay

## 2020-04-11 DIAGNOSIS — I4892 Unspecified atrial flutter: Secondary | ICD-10-CM | POA: Diagnosis not present

## 2020-04-11 DIAGNOSIS — I4891 Unspecified atrial fibrillation: Secondary | ICD-10-CM | POA: Diagnosis not present

## 2020-04-11 DIAGNOSIS — Z5181 Encounter for therapeutic drug level monitoring: Secondary | ICD-10-CM | POA: Diagnosis not present

## 2020-04-11 LAB — POCT INR: INR: 2.2 (ref 2.0–3.0)

## 2020-04-11 NOTE — Patient Instructions (Signed)
Description   Continue on same dosage 1/2 tablet daily. Continue eating green leafy veggies one day out of the week. Recheck in 6 weeks. [facility] [phone]. Please call if ordered any new medication or if scheduled for any procedure.

## 2020-05-06 ENCOUNTER — Other Ambulatory Visit: Payer: Self-pay | Admitting: Cardiovascular Disease

## 2020-05-23 ENCOUNTER — Other Ambulatory Visit: Payer: Self-pay

## 2020-05-23 ENCOUNTER — Ambulatory Visit (INDEPENDENT_AMBULATORY_CARE_PROVIDER_SITE_OTHER): Payer: Medicare Other | Admitting: *Deleted

## 2020-05-23 DIAGNOSIS — I4891 Unspecified atrial fibrillation: Secondary | ICD-10-CM | POA: Diagnosis not present

## 2020-05-23 DIAGNOSIS — Z5181 Encounter for therapeutic drug level monitoring: Secondary | ICD-10-CM

## 2020-05-23 DIAGNOSIS — I4892 Unspecified atrial flutter: Secondary | ICD-10-CM | POA: Diagnosis not present

## 2020-05-23 LAB — POCT INR: INR: 2.6 (ref 2.0–3.0)

## 2020-05-23 NOTE — Patient Instructions (Addendum)
Description   Continue taking Warfarin 1/2 tablet daily. Continue eating green leafy veggies one day out of the week. Recheck in 6 weeks. [facility] [phone]. Please call if ordered any new medication or if scheduled for any procedure.       

## 2020-06-25 ENCOUNTER — Other Ambulatory Visit: Payer: Self-pay | Admitting: Cardiovascular Disease

## 2020-06-25 DIAGNOSIS — I482 Chronic atrial fibrillation, unspecified: Secondary | ICD-10-CM

## 2020-06-25 DIAGNOSIS — I422 Other hypertrophic cardiomyopathy: Secondary | ICD-10-CM

## 2020-07-04 ENCOUNTER — Ambulatory Visit (INDEPENDENT_AMBULATORY_CARE_PROVIDER_SITE_OTHER): Payer: Medicare Other | Admitting: *Deleted

## 2020-07-04 ENCOUNTER — Other Ambulatory Visit: Payer: Self-pay

## 2020-07-04 DIAGNOSIS — Z5181 Encounter for therapeutic drug level monitoring: Secondary | ICD-10-CM

## 2020-07-04 DIAGNOSIS — I4891 Unspecified atrial fibrillation: Secondary | ICD-10-CM | POA: Diagnosis not present

## 2020-07-04 DIAGNOSIS — I4892 Unspecified atrial flutter: Secondary | ICD-10-CM | POA: Diagnosis not present

## 2020-07-04 LAB — POCT INR: INR: 3 (ref 2.0–3.0)

## 2020-07-04 NOTE — Patient Instructions (Signed)
Description   Continue taking Warfarin 1/2 tablet daily. Continue eating green leafy veggies one day out of the week. Recheck in 6 weeks. [facility] [phone]. Please call if ordered any new medication or if scheduled for any procedure.

## 2020-08-14 ENCOUNTER — Other Ambulatory Visit: Payer: Self-pay | Admitting: Cardiovascular Disease

## 2020-08-14 DIAGNOSIS — I422 Other hypertrophic cardiomyopathy: Secondary | ICD-10-CM

## 2020-08-14 DIAGNOSIS — I482 Chronic atrial fibrillation, unspecified: Secondary | ICD-10-CM

## 2020-08-22 ENCOUNTER — Ambulatory Visit (INDEPENDENT_AMBULATORY_CARE_PROVIDER_SITE_OTHER): Payer: Medicare Other | Admitting: *Deleted

## 2020-08-22 ENCOUNTER — Other Ambulatory Visit: Payer: Self-pay

## 2020-08-22 DIAGNOSIS — I4891 Unspecified atrial fibrillation: Secondary | ICD-10-CM | POA: Diagnosis not present

## 2020-08-22 DIAGNOSIS — Z5181 Encounter for therapeutic drug level monitoring: Secondary | ICD-10-CM | POA: Diagnosis not present

## 2020-08-22 DIAGNOSIS — I4892 Unspecified atrial flutter: Secondary | ICD-10-CM | POA: Diagnosis not present

## 2020-08-22 LAB — POCT INR: INR: 3.2 — AB (ref 2.0–3.0)

## 2020-08-22 NOTE — Patient Instructions (Addendum)
Description   Hold tomorrow's dose then continue taking Warfarin 1/2 tablet daily. Continue eating green leafy veggies one day out of the week. Recheck in 5 weeks. [facility] [phone]. Please call if ordered any new medication or if scheduled for any procedure.

## 2020-09-05 ENCOUNTER — Other Ambulatory Visit: Payer: Self-pay | Admitting: Cardiovascular Disease

## 2020-09-12 ENCOUNTER — Other Ambulatory Visit: Payer: Self-pay | Admitting: Cardiovascular Disease

## 2020-09-12 DIAGNOSIS — I482 Chronic atrial fibrillation, unspecified: Secondary | ICD-10-CM

## 2020-09-12 DIAGNOSIS — I422 Other hypertrophic cardiomyopathy: Secondary | ICD-10-CM

## 2020-09-24 ENCOUNTER — Other Ambulatory Visit (HOSPITAL_COMMUNITY): Payer: Medicare Other

## 2020-09-24 ENCOUNTER — Other Ambulatory Visit: Payer: Self-pay

## 2020-09-24 ENCOUNTER — Ambulatory Visit (HOSPITAL_COMMUNITY)
Admission: RE | Admit: 2020-09-24 | Discharge: 2020-09-24 | Disposition: A | Discharge status: Home / Self Care | Payer: Medicare Other | Source: Ambulatory Visit | Attending: Cardiovascular Disease | Admitting: Cardiovascular Disease

## 2020-09-24 DIAGNOSIS — I110 Hypertensive heart disease with heart failure: Secondary | ICD-10-CM | POA: Diagnosis not present

## 2020-09-24 DIAGNOSIS — I5032 Chronic diastolic (congestive) heart failure: Secondary | ICD-10-CM | POA: Diagnosis not present

## 2020-09-24 DIAGNOSIS — E785 Hyperlipidemia, unspecified: Secondary | ICD-10-CM | POA: Insufficient documentation

## 2020-09-24 DIAGNOSIS — I4891 Unspecified atrial fibrillation: Secondary | ICD-10-CM

## 2020-09-24 LAB — ECHOCARDIOGRAM COMPLETE: S' Lateral: 3.95 cm

## 2020-09-24 NOTE — Progress Notes (Signed)
Echocardiogram 2D Echocardiogram has been performed.  Bradley Winters 09/24/2020, 3:45 PM

## 2020-09-26 ENCOUNTER — Other Ambulatory Visit: Payer: Self-pay

## 2020-09-26 ENCOUNTER — Encounter: Payer: Self-pay | Admitting: Cardiovascular Disease

## 2020-09-26 ENCOUNTER — Ambulatory Visit (INDEPENDENT_AMBULATORY_CARE_PROVIDER_SITE_OTHER): Payer: Medicare Other | Admitting: *Deleted

## 2020-09-26 ENCOUNTER — Ambulatory Visit (INDEPENDENT_AMBULATORY_CARE_PROVIDER_SITE_OTHER): Payer: Medicare Other | Admitting: Cardiovascular Disease

## 2020-09-26 VITALS — BP 138/70 | HR 76 | Ht 71.0 in | Wt 246.0 lb

## 2020-09-26 DIAGNOSIS — I5032 Chronic diastolic (congestive) heart failure: Secondary | ICD-10-CM

## 2020-09-26 DIAGNOSIS — I4821 Permanent atrial fibrillation: Secondary | ICD-10-CM | POA: Diagnosis not present

## 2020-09-26 DIAGNOSIS — I4892 Unspecified atrial flutter: Secondary | ICD-10-CM

## 2020-09-26 DIAGNOSIS — Z5181 Encounter for therapeutic drug level monitoring: Secondary | ICD-10-CM | POA: Diagnosis not present

## 2020-09-26 DIAGNOSIS — I4891 Unspecified atrial fibrillation: Secondary | ICD-10-CM | POA: Diagnosis not present

## 2020-09-26 DIAGNOSIS — E782 Mixed hyperlipidemia: Secondary | ICD-10-CM | POA: Diagnosis not present

## 2020-09-26 LAB — POCT INR: INR: 4.2 — AB (ref 2.0–3.0)

## 2020-09-26 NOTE — Progress Notes (Signed)
Cardiology Office Note:    Date:  09/26/2020   ID:  Bradley Winters, DOB 1941/03/25, MRN [id number]  PCP:  Everardo Beals, NP  [facility] Cardiologist:  [name], MD  [place] Electrophysiologist:  None   Referring MD: Everardo Beals, NP   Chief Complaint  Patient presents with  . Atrial Fibrillation    History of Present Illness:    Bradley Winters is a 79 y.o. male with a hx of permanent atrial fibrillation, chronic diastolic heart failure, and lymphedema, presenting for follow-up evaluation.   Bradley Winters is here alone today.  He is really doing quite well.  He is lost about 13 pounds since his last office visit 6 months ago.  He states that his legs are less swollen and he thinks that most of the weight loss is decreased fluid.  He is short of breath with any prolonged walking, but does not have any symptoms with his normal activities.  He denies orthopnea or PND.  No chest pain or pressure.  No lightheadedness or presyncope.  He does not experience heart palpitations.  Past Medical History:  Diagnosis Date  . Anxiety    MOSTLY AT NIGHT  . Bladder cancer ([place])   . ED (erectile dysfunction)   . History of colonoscopy with polypectomy    BENIGN  . History of gastric ulcer   . History of seizures as a child    FEBRILE  . Hypertension   . Hypertrophic cardiomyopathy ([place])    HEREDITARY--  POSITIVE for MYBPC3  GENE (HETEROZYGOUS)  . Persistent atrial fibrillation ([place])    CARDIOLOGIST-- DR [name]  AND DR [name]  . PONV (postoperative nausea and vomiting)   . Thrombocytopenia ([place]) PT ASYMPTOMATIC   MILD--  HEMOTOLOGIST--  DR [name]  . Venous insufficiency   . Wears glasses     Past Surgical History:  Procedure Laterality Date  . CARDIAC ELECTROPHYSIOLOGY MAPPING AND ABLATION  12-07-2008  &  05-08-2009  DR [name]   SUCCESSFUL ABLATION ATRIAL-FIBRILLATION AND CARDIOVERSION  . CARDIOVERSION  MULTIPLE -----  BETWEEN 2008  to  07-17-2009  . CATARACT  EXTRACTION W/ INTRAOCULAR LENS IMPLANT Right   . SLEEP STUDY  2011   NEGATIVE  . TEE WITH CARDIOVERSION  11-27-2007  . TONSILLECTOMY AND ADENOIDECTOMY  AS CHILD  . TRANSTHORACIC ECHOCARDIOGRAM  06-16-2013  DR Yanette Tripoli   PT HAS HX HCM/  MODERATE LVH with MILO SAM/  EF 55-60%/  MILD MR/  SEVERE LAE/  MODERATE RAE  . TRANSURETHRAL RESECTION OF BLADDER TUMOR N/A 02/10/2014   Procedure: TRANSURETHRAL RESECTION OF BLADDER TUMOR (TURBT);  Surgeon: [name], MD;  Location: [facility];  Service: Urology;  Laterality: N/A;  . TRANSURETHRAL RESECTION OF BLADDER TUMOR N/A 04/07/2014   Procedure: TRANSURETHRAL  BLADDER BIOPSY;  Surgeon: [name], MD;  Location: [facility];  Service: Urology;  Laterality: N/A;    Current Medications: Current Meds  Medication Sig  . acetaminophen (TYLENOL) 325 MG tablet Take 325 mg by mouth every 6 (six) hours as needed for moderate pain.   [name] diltiazem (CARDIZEM CD) 180 MG 24 hr capsule TAKE 1 CAPSULE BY MOUTH EVERY DAY  . finasteride (PROPECIA) 1 MG tablet Take 1 mg by mouth daily.  . fluticasone (FLONASE) 50 MCG/ACT nasal spray Place 1 spray into both nostrils daily.  . furosemide (LASIX) 40 MG tablet TAKE 1 TABLET BY MOUTH TWICE A DAY  . KLOR-CON M20 20 MEQ tablet TAKE 1 TABLET  BY MOUTH TWICE A DAY  . metoprolol succinate (TOPROL-XL) 25 MG 24 hr tablet TAKE 1 TABLET BY MOUTH EVERY DAY  . spironolactone (ALDACTONE) 25 MG tablet TAKE 1 TABLET BY MOUTH EVERY DAY  . warfarin (COUMADIN) 2 MG tablet TAKE AS DIRECTED BY COUMADIN CLINIC     Allergies:   Prednisone   Social History   Socioeconomic History  . Marital status: Married    Spouse name: Not on file  . Number of children: Not on file  . Years of education: Not on file  . Highest education level: Not on file  Occupational History  . Occupation: retired  Tobacco Use  . Smoking status: Former Smoker    Years: 42.00    Types: Cigarettes    Quit date: 10/27/1998     Years since quitting: 21.9  . Smokeless tobacco: Never Used  Vaping Use  . Vaping Use: Never used  Substance and Sexual Activity  . Alcohol use: Yes    Comment: VERY RARE  . Drug use: No  . Sexual activity: Not on file  Other Topics Concern  . Not on file  Social History Narrative  . Not on file   Social Determinants of Health   Financial Resource Strain:   . Difficulty of Paying Living Expenses: Not on file  Food Insecurity:   . Worried About Charity fundraiser in the Last Year: Not on file  . Ran Out of Food in the Last Year: Not on file  Transportation Needs:   . Lack of Transportation (Medical): Not on file  . Lack of Transportation (Non-Medical): Not on file  Physical Activity:   . Days of Exercise per Week: Not on file  . Minutes of Exercise per Session: Not on file  Stress:   . Feeling of Stress : Not on file  Social Connections:   . Frequency of Communication with Friends and Family: Not on file  . Frequency of Social Gatherings with Friends and Family: Not on file  . Attends Religious Services: Not on file  . Active Member of Clubs or Organizations: Not on file  . Attends Archivist Meetings: Not on file  . Marital Status: Not on file     Family History: The patient's family history includes Colon cancer in an other family member.  ROS:   Please see the history of present illness.    All other systems reviewed and are negative.  EKGs/Labs/Other Studies Reviewed:    The following studies were reviewed today: Echo 09/24/2020: IMPRESSIONS    1. Left ventricular ejection fraction, by estimation, is 55 to 60%. Left  ventricular ejection fraction by 3D volume is 61 %. The left ventricle has  normal function. The left ventricle has no regional wall motion  abnormalities. Left ventricular diastolic  function could not be evaluated.  2. Right ventricular systolic function is mildly reduced. The right  ventricular size is normal. There is normal  pulmonary artery systolic  pressure. The estimated right ventricular systolic pressure is 95.1 mmHg.  3. Left atrial size was severely dilated.  4. Right atrial size was severely dilated.  5. The mitral valve is grossly normal. Trivial mitral valve  regurgitation. No evidence of mitral stenosis.  6. The aortic valve is tricuspid. Aortic valve regurgitation is not  visualized. No aortic stenosis is present.  7. The inferior vena cava is normal in size with greater than 50%  respiratory variability, suggesting right atrial pressure of 3 mmHg.   Comparison(s):  Changes from prior study are noted. EF now 55-60%. RVSP  normal. Severe biatrial enlargement remains.   EKG:  EKG is not ordered today  Recent Labs: No results found for requested labs within last 8760 hours.  Recent Lipid Panel    Component Value Date/Time   CHOL 138 07/12/2019 1525   TRIG 97 07/12/2019 1525   HDL 47 07/12/2019 1525   CHOLHDL 2.9 07/12/2019 1525   CHOLHDL 3 05/22/2015 0906   VLDL 18.6 05/22/2015 0906   LDLCALC 73 07/12/2019 1525     Risk Assessment/Calculations:     CHA2DS2-VASc Score = 4  This indicates a 4.8% annual risk of stroke. The patient's score is based upon: CHF History: 1 HTN History: 1 Diabetes History: 0 Stroke History: 0 Vascular Disease History: 0 Age Score: 2 Gender Score: 0      Physical Exam:    VS:  BP 138/70   Pulse 76   Ht 5\' 11"  (1.803 m)   Wt 246 lb (111.6 kg)   BMI 34.31 kg/m     Wt Readings from Last 3 Encounters:  09/26/20 246 lb (111.6 kg)  03/19/20 258 lb 12.8 oz (117.4 kg)  05/26/19 248 lb 9.6 oz (112.8 kg)     GEN:  Well nourished, well developed in no acute distress HEENT: Normal NECK: No JVD; No carotid bruits LYMPHATICS: No lymphadenopathy CARDIAC: Irregularly irregular, no murmurs, rubs, gallops RESPIRATORY:  Clear to auscultation without rales, wheezing or rhonchi  ABDOMEN: Soft, non-tender, non-distended MUSCULOSKELETAL: 2+ bilateral leg  edema; No deformity  SKIN: Warm and dry NEUROLOGIC:  Alert and oriented x 3 PSYCHIATRIC:  Normal affect   ASSESSMENT:    1. Permanent atrial fibrillation ([place])   2. Mixed hyperlipidemia   3. Chronic diastolic heart failure ([facility])    PLAN:    In order of problems listed above:  1. Managed with warfarin for chronic oral anticoagulation, diltiazem and metoprolol succinate for rate control.  The patient is doing quite well.  I reviewed his echo today and he continues to demonstrate normal LV systolic function.  He does have severe biatrial enlargement which is commonly found in patients with permanent atrial fibrillation.  This has been demonstrated in the past as well.  He will continue his current medical program. 2. Due for follow-up labs.  Will arrange a full lab panel with CBC, metabolic panel, and lipid panel. 3. Overall doing very well.  I think his functional status is improved.  He has lost some weight and is encouraged about his efforts in this regard.  He continues on furosemide 40 mg twice daily.  Will update his labs with a metabolic panel to check electrolytes and kidney function.  Overall I think Bradley Winters is stable from a cardiovascular perspective.  He is a great guy and brightens our day every time he comes in for follow-up visits.  We plan to see him back in about 6 months and he will call sooner if any problems arise.    Shared Decision Making/Informed Consent        Medication Adjustments/Labs and Tests Ordered: Current medicines are reviewed at length with the patient today.  Concerns regarding medicines are outlined above.  Orders Placed This Encounter  Procedures  . CBC with Differential/Platelet  . Comprehensive metabolic panel  . Lipid panel   No orders of the defined types were placed in this encounter.   Patient Instructions  Medication Instructions:  Your provider recommends that you continue on your current medications as  directed. Please refer to the  Current Medication list given to you today.   *If you need a refill on your cardiac medications before your next appointment, please call your pharmacy*  Lab Work: Your provider recommends that you return for FASTING lab work. If you have labs (blood work) drawn today and your tests are completely normal, you will receive your results only by: [name] MyChart Message (if you have MyChart) OR . A paper copy in the mail If you have any lab test that is abnormal or we need to change your treatment, we will call you to review the results.  Follow-Up: Your provider recommends that you schedule a follow-up appointment in 6 months.    Signed, [name], MD  09/26/2020 5:27 PM     [facility]

## 2020-09-26 NOTE — Patient Instructions (Addendum)
Description   Hold tomorrow's dose then continue taking Warfarin 1/2 tablet daily (let us know when you start running low). Start eating green leafy veggies twice a week (turnip greens, slaw, salads). Recheck in 3 weeks (normally 6 weeks). [facility] [phone]. Please call if ordered any new medication or if scheduled for any procedure.

## 2020-09-26 NOTE — Patient Instructions (Signed)
Medication Instructions:  Your provider recommends that you continue on your current medications as directed. Please refer to the Current Medication list given to you today.   *If you need a refill on your cardiac medications before your next appointment, please call your pharmacy*  Lab Work: Your provider recommends that you return for FASTING lab work. If you have labs (blood work) drawn today and your tests are completely normal, you will receive your results only by: [name] MyChart Message (if you have MyChart) OR . A paper copy in the mail If you have any lab test that is abnormal or we need to change your treatment, we will call you to review the results.  Follow-Up: Your provider recommends that you schedule a follow-up appointment in 6 months.

## 2020-10-08 ENCOUNTER — Other Ambulatory Visit: Payer: Self-pay | Admitting: Cardiovascular Disease

## 2020-10-08 DIAGNOSIS — I482 Chronic atrial fibrillation, unspecified: Secondary | ICD-10-CM

## 2020-10-08 DIAGNOSIS — I422 Other hypertrophic cardiomyopathy: Secondary | ICD-10-CM

## 2020-10-17 ENCOUNTER — Other Ambulatory Visit: Payer: Self-pay

## 2020-10-17 ENCOUNTER — Ambulatory Visit (INDEPENDENT_AMBULATORY_CARE_PROVIDER_SITE_OTHER): Payer: Medicare Other | Admitting: *Deleted

## 2020-10-17 ENCOUNTER — Other Ambulatory Visit: Payer: Medicare Other | Admitting: *Deleted

## 2020-10-17 DIAGNOSIS — I4821 Permanent atrial fibrillation: Secondary | ICD-10-CM

## 2020-10-17 DIAGNOSIS — E782 Mixed hyperlipidemia: Secondary | ICD-10-CM

## 2020-10-17 DIAGNOSIS — I4892 Unspecified atrial flutter: Secondary | ICD-10-CM | POA: Diagnosis not present

## 2020-10-17 DIAGNOSIS — I4891 Unspecified atrial fibrillation: Secondary | ICD-10-CM | POA: Diagnosis not present

## 2020-10-17 DIAGNOSIS — Z5181 Encounter for therapeutic drug level monitoring: Secondary | ICD-10-CM | POA: Diagnosis not present

## 2020-10-17 LAB — CBC WITH DIFFERENTIAL/PLATELET
Basophils Absolute: 0 10*3/uL (ref 0.0–0.2)
Basos: 0 %
EOS (ABSOLUTE): 0.3 10*3/uL (ref 0.0–0.4)
Eos: 3 %
Hematocrit: 40.3 % (ref 37.5–51.0)
Hemoglobin: 13.5 g/dL (ref 13.0–17.7)
Immature Grans (Abs): 0 10*3/uL (ref 0.0–0.1)
Immature Granulocytes: 0 %
Lymphocytes Absolute: 2 10*3/uL (ref 0.7–3.1)
Lymphs: 22 %
MCH: 31 pg (ref 26.6–33.0)
MCHC: 33.5 g/dL (ref 31.5–35.7)
MCV: 92 fL (ref 79–97)
Monocytes Absolute: 0.9 10*3/uL (ref 0.1–0.9)
Monocytes: 10 %
Neutrophils Absolute: 5.9 10*3/uL (ref 1.4–7.0)
Neutrophils: 65 %
Platelets: 172 10*3/uL (ref 150–450)
RBC: 4.36 x10E6/uL (ref 4.14–5.80)
RDW: 12.9 % (ref 11.6–15.4)
WBC: 9.2 10*3/uL (ref 3.4–10.8)

## 2020-10-17 LAB — LIPID PANEL
Chol/HDL Ratio: 3.3 ratio (ref 0.0–5.0)
Cholesterol, Total: 143 mg/dL (ref 100–199)
HDL: 43 mg/dL (ref >39)
LDL Chol Calc (NIH): 81 mg/dL (ref 0–99)
Triglycerides: 100 mg/dL (ref 0–149)
VLDL Cholesterol Cal: 19 mg/dL (ref 5–40)

## 2020-10-17 LAB — COMPREHENSIVE METABOLIC PANEL
ALT: 8 IU/L (ref 0–44)
AST: 14 IU/L (ref 0–40)
Albumin/Globulin Ratio: 1.4 (ref 1.2–2.2)
Albumin: 4.1 g/dL (ref 3.7–4.7)
Alkaline Phosphatase: 64 IU/L (ref 44–121)
BUN/Creatinine Ratio: 15 (ref 10–24)
BUN: 18 mg/dL (ref 8–27)
Bilirubin Total: 0.9 mg/dL (ref 0.0–1.2)
CO2: 31 mmol/L — ABNORMAL HIGH (ref 20–29)
Calcium: 9.3 mg/dL (ref 8.6–10.2)
Chloride: 99 mmol/L (ref 96–106)
Creatinine, Ser: 1.23 mg/dL (ref 0.76–1.27)
GFR calc Af Amer: 64 mL/min/{1.73_m2} (ref >59)
GFR calc non Af Amer: 55 mL/min/{1.73_m2} — ABNORMAL LOW (ref >59)
Globulin, Total: 3 g/dL (ref 1.5–4.5)
Glucose: 107 mg/dL — ABNORMAL HIGH (ref 65–99)
Potassium: 4.3 mmol/L (ref 3.5–5.2)
Sodium: 141 mmol/L (ref 134–144)
Total Protein: 7.1 g/dL (ref 6.0–8.5)

## 2020-10-17 LAB — POCT INR: INR: 3.9 — AB (ref 2.0–3.0)

## 2020-10-17 NOTE — Patient Instructions (Addendum)
Description   Hold today's dose then continue taking Warfarin 1/2 tablet daily (let us know when you start running low so we can send in a different tablet strength). Start eating green leafy veggies 3-4 times a week (turnip greens, slaw, salads). Recheck in 3 weeks (normally 6 weeks). [facility] [phone]. Please call if ordered any new medication or if scheduled for any procedure.

## 2020-10-31 ENCOUNTER — Other Ambulatory Visit: Payer: Self-pay | Admitting: Cardiovascular Disease

## 2020-11-07 ENCOUNTER — Other Ambulatory Visit: Payer: Self-pay

## 2020-11-07 ENCOUNTER — Ambulatory Visit (INDEPENDENT_AMBULATORY_CARE_PROVIDER_SITE_OTHER): Payer: Medicare Other | Admitting: *Deleted

## 2020-11-07 DIAGNOSIS — Z5181 Encounter for therapeutic drug level monitoring: Secondary | ICD-10-CM | POA: Diagnosis not present

## 2020-11-07 DIAGNOSIS — I4891 Unspecified atrial fibrillation: Secondary | ICD-10-CM

## 2020-11-07 DIAGNOSIS — I4892 Unspecified atrial flutter: Secondary | ICD-10-CM

## 2020-11-07 LAB — POCT INR: INR: 3 (ref 2.0–3.0)

## 2020-11-07 NOTE — Patient Instructions (Signed)
Description   Continue taking Warfarin 1/2 tablet daily (let us know when you start running low so we can send in a different tablet strength). Start eating green leafy veggies 3-4 times a week (turnip greens, slaw, salads). Recheck in 4 weeks.  [facility] [phone]. Please call if ordered any new medication or if scheduled for any procedure.

## 2020-12-05 ENCOUNTER — Other Ambulatory Visit: Payer: Self-pay

## 2020-12-05 ENCOUNTER — Ambulatory Visit (INDEPENDENT_AMBULATORY_CARE_PROVIDER_SITE_OTHER): Payer: Medicare Other | Admitting: *Deleted

## 2020-12-05 DIAGNOSIS — I4891 Unspecified atrial fibrillation: Secondary | ICD-10-CM

## 2020-12-05 DIAGNOSIS — I4892 Unspecified atrial flutter: Secondary | ICD-10-CM | POA: Diagnosis not present

## 2020-12-05 DIAGNOSIS — Z5181 Encounter for therapeutic drug level monitoring: Secondary | ICD-10-CM

## 2020-12-05 LAB — POCT INR: INR: 4.9 — AB (ref 2.0–3.0)

## 2020-12-05 MED ORDER — WARFARIN SODIUM 1 MG PO TABS: ORAL_TABLET | ORAL | 1 refills | Status: DC

## 2020-12-05 NOTE — Patient Instructions (Signed)
Description    Stop using the warfarin 2 mg tablets. Start using warfarin 1 mg tablet. Hold warfarin on 2/10 and 2/11 then start taking 1 tablet (1mg ) daily except for 1/2 a tablet (0.5 mg)on Sundays. Be consistent with your 2 servings of greens per week.  Recheck INR in 2 weeks. [facility] [phone].

## 2020-12-06 ENCOUNTER — Telehealth: Payer: Self-pay

## 2020-12-06 NOTE — Telephone Encounter (Signed)
The patient is having MyChart issues and wanted to know results of labs from December.  Reviewed results with patient who verbalized understanding.    Reset the patient's MyChart password per request. He was grateful for call and agrees with plan.

## 2020-12-08 ENCOUNTER — Other Ambulatory Visit: Payer: Self-pay | Admitting: Cardiovascular Disease

## 2020-12-19 ENCOUNTER — Other Ambulatory Visit: Payer: Self-pay

## 2020-12-19 ENCOUNTER — Ambulatory Visit (INDEPENDENT_AMBULATORY_CARE_PROVIDER_SITE_OTHER): Payer: Medicare Other | Admitting: *Deleted

## 2020-12-19 DIAGNOSIS — I4891 Unspecified atrial fibrillation: Secondary | ICD-10-CM

## 2020-12-19 DIAGNOSIS — Z5181 Encounter for therapeutic drug level monitoring: Secondary | ICD-10-CM | POA: Diagnosis not present

## 2020-12-19 DIAGNOSIS — I4892 Unspecified atrial flutter: Secondary | ICD-10-CM

## 2020-12-19 LAB — POCT INR: INR: 1.7 — AB (ref 2.0–3.0)

## 2020-12-19 NOTE — Patient Instructions (Addendum)
Description   Using 1mg  (pink tablets) tablets. Start taking 1/2 tablet daily except 1 tablet on Sundays and Thursdays. Be consistent with your 2 servings of greens per week.  Recheck INR in 2 weeks. [facility] [phone].

## 2020-12-24 ENCOUNTER — Other Ambulatory Visit: Payer: Self-pay | Admitting: Cardiovascular Disease

## 2020-12-24 DIAGNOSIS — I421 Obstructive hypertrophic cardiomyopathy: Secondary | ICD-10-CM

## 2020-12-24 DIAGNOSIS — I4891 Unspecified atrial fibrillation: Secondary | ICD-10-CM

## 2020-12-27 ENCOUNTER — Other Ambulatory Visit: Payer: Self-pay | Admitting: *Deleted

## 2020-12-27 MED ORDER — WARFARIN SODIUM 1 MG PO TABS: ORAL_TABLET | ORAL | 1 refills | Status: DC

## 2021-01-02 ENCOUNTER — Ambulatory Visit (INDEPENDENT_AMBULATORY_CARE_PROVIDER_SITE_OTHER): Payer: Medicare Other | Admitting: *Deleted

## 2021-01-02 ENCOUNTER — Other Ambulatory Visit: Payer: Self-pay

## 2021-01-02 DIAGNOSIS — Z5181 Encounter for therapeutic drug level monitoring: Secondary | ICD-10-CM | POA: Diagnosis not present

## 2021-01-02 DIAGNOSIS — I4892 Unspecified atrial flutter: Secondary | ICD-10-CM

## 2021-01-02 DIAGNOSIS — I4891 Unspecified atrial fibrillation: Secondary | ICD-10-CM

## 2021-01-02 LAB — POCT INR: INR: 1.9 — AB (ref 2.0–3.0)

## 2021-01-02 NOTE — Patient Instructions (Signed)
Description   Using 1mg  (pink tablets) tablets. Today take another 1/2 tablet (total of 1 tablet today) then start taking 1/2 tablet daily except 1 tablet on Sundays, Tuesdays, and Thursdays. Be consistent with your 2 servings of greens per week-pt states he will do only 1 serving per week starting 12/19/20 until next appt.  Recheck INR in 2 weeks. [facility] [phone].

## 2021-01-16 ENCOUNTER — Ambulatory Visit (INDEPENDENT_AMBULATORY_CARE_PROVIDER_SITE_OTHER): Payer: Medicare Other

## 2021-01-16 ENCOUNTER — Other Ambulatory Visit: Payer: Self-pay

## 2021-01-16 DIAGNOSIS — I4892 Unspecified atrial flutter: Secondary | ICD-10-CM | POA: Diagnosis not present

## 2021-01-16 DIAGNOSIS — I4891 Unspecified atrial fibrillation: Secondary | ICD-10-CM

## 2021-01-16 DIAGNOSIS — Z5181 Encounter for therapeutic drug level monitoring: Secondary | ICD-10-CM | POA: Diagnosis not present

## 2021-01-16 LAB — POCT INR: INR: 2.2 (ref 2.0–3.0)

## 2021-01-16 NOTE — Patient Instructions (Signed)
Description   Continue on same dosage 1/2 tablet daily except 1 tablet on Sundays, Tuesdays, and Thursdays. Be consistent with your 2 servings of greens per week.  Recheck INR in 3 weeks. [facility] [phone].

## 2021-01-19 ENCOUNTER — Other Ambulatory Visit: Payer: Self-pay | Admitting: Cardiovascular Disease

## 2021-02-22 ENCOUNTER — Other Ambulatory Visit: Payer: Self-pay | Admitting: Cardiovascular Disease

## 2021-02-22 ENCOUNTER — Ambulatory Visit (INDEPENDENT_AMBULATORY_CARE_PROVIDER_SITE_OTHER): Payer: Medicare Other | Admitting: *Deleted

## 2021-02-22 ENCOUNTER — Other Ambulatory Visit: Payer: Self-pay

## 2021-02-22 DIAGNOSIS — I4891 Unspecified atrial fibrillation: Secondary | ICD-10-CM

## 2021-02-22 DIAGNOSIS — I4892 Unspecified atrial flutter: Secondary | ICD-10-CM | POA: Diagnosis not present

## 2021-02-22 DIAGNOSIS — Z5181 Encounter for therapeutic drug level monitoring: Secondary | ICD-10-CM

## 2021-02-22 LAB — POCT INR: INR: 1.9 — AB (ref 2.0–3.0)

## 2021-02-22 NOTE — Patient Instructions (Signed)
Description   -Take another 1/2 tablet of warfarin today.  -Continue to take warfarin 1/2 a tablet daily, except for 1 tablet on Sunday, Tuesday and Thursday.  -Recheck INR in 3 weeks, coumadin clinic [phone]

## 2021-03-18 ENCOUNTER — Other Ambulatory Visit: Payer: Self-pay

## 2021-03-18 ENCOUNTER — Ambulatory Visit (INDEPENDENT_AMBULATORY_CARE_PROVIDER_SITE_OTHER): Payer: Medicare Other | Admitting: *Deleted

## 2021-03-18 DIAGNOSIS — I4892 Unspecified atrial flutter: Secondary | ICD-10-CM

## 2021-03-18 DIAGNOSIS — Z5181 Encounter for therapeutic drug level monitoring: Secondary | ICD-10-CM

## 2021-03-18 DIAGNOSIS — I4891 Unspecified atrial fibrillation: Secondary | ICD-10-CM | POA: Diagnosis not present

## 2021-03-18 LAB — POCT INR: INR: 1.7 — AB (ref 2.0–3.0)

## 2021-03-18 NOTE — Patient Instructions (Signed)
Description   Take another 1/2 tablet of warfarin today, then start taking warfarin 1 tablet daily except for 1/2 tablet on Monday, Wednesday, and Friday. Recheck INR in 2 weeks with MD appt. [facility] [phone]

## 2021-03-27 DEATH — deceased

## 2021-04-03 ENCOUNTER — Ambulatory Visit: Payer: Medicare Other | Admitting: Cardiovascular Disease
